# Patient Record
Sex: Male | Born: 1985
Health system: Southern US, Community
[De-identification: ages and names within clinical notes are randomized; demographics above are authoritative.]

## PROBLEM LIST (undated history)

## (undated) ENCOUNTER — Emergency Department (HOSPITAL_COMMUNITY): Admission: EM | Payer: No Typology Code available for payment source | Source: Home / Self Care

## (undated) DIAGNOSIS — Z8719 Personal history of other diseases of the digestive system: Secondary | ICD-10-CM

## (undated) DIAGNOSIS — E119 Type 2 diabetes mellitus without complications: Secondary | ICD-10-CM

## (undated) DIAGNOSIS — R868 Other abnormal findings in specimens from male genital organs: Secondary | ICD-10-CM

## (undated) DIAGNOSIS — K921 Melena: Secondary | ICD-10-CM

## (undated) DIAGNOSIS — K219 Gastro-esophageal reflux disease without esophagitis: Secondary | ICD-10-CM

## (undated) HISTORY — DX: Melena: K92.1

## (undated) HISTORY — DX: Personal history of other diseases of the digestive system: Z87.19

## (undated) HISTORY — DX: Other abnormal findings in specimens from male genital organs: R86.8

---
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
## 2006-08-28 ENCOUNTER — Emergency Department (HOSPITAL_COMMUNITY): Admission: EM | Admit: 2006-08-28 | Discharge: 2006-08-28 | Payer: Self-pay | Admitting: Emergency Medicine

## 2007-07-23 ENCOUNTER — Emergency Department (HOSPITAL_COMMUNITY): Admission: EM | Admit: 2007-07-23 | Discharge: 2007-07-23 | Payer: Self-pay | Admitting: Emergency Medicine

## 2007-08-13 ENCOUNTER — Emergency Department (HOSPITAL_COMMUNITY): Admission: EM | Admit: 2007-08-13 | Discharge: 2007-08-13 | Payer: Self-pay | Admitting: Emergency Medicine

## 2007-09-21 ENCOUNTER — Emergency Department (HOSPITAL_COMMUNITY): Admission: EM | Admit: 2007-09-21 | Discharge: 2007-09-21 | Payer: Self-pay | Admitting: Emergency Medicine

## 2007-10-01 ENCOUNTER — Emergency Department (HOSPITAL_COMMUNITY): Admission: EM | Admit: 2007-10-01 | Discharge: 2007-10-02 | Payer: Self-pay | Admitting: Emergency Medicine

## 2010-03-16 ENCOUNTER — Emergency Department (HOSPITAL_COMMUNITY): Admission: EM | Admit: 2010-03-16 | Discharge: 2010-03-16 | Payer: Self-pay | Admitting: Family Medicine

## 2010-08-04 ENCOUNTER — Emergency Department (HOSPITAL_COMMUNITY): Admission: EM | Admit: 2010-08-04 | Discharge: 2010-08-04 | Payer: Self-pay | Admitting: Emergency Medicine

## 2010-08-06 HISTORY — PX: RECTAL SURGERY: SHX760

## 2010-08-10 ENCOUNTER — Ambulatory Visit (HOSPITAL_COMMUNITY): Admission: RE | Admit: 2010-08-10 | Discharge: 2010-08-10 | Payer: Self-pay | Admitting: General Surgery

## 2010-11-06 HISTORY — PX: ANAL FISTULECTOMY: SHX1139

## 2011-01-19 LAB — SURGICAL PCR SCREEN
MRSA, PCR: NEGATIVE
Staphylococcus aureus: NEGATIVE

## 2011-04-04 ENCOUNTER — Inpatient Hospital Stay (INDEPENDENT_AMBULATORY_CARE_PROVIDER_SITE_OTHER)
Admission: RE | Admit: 2011-04-04 | Discharge: 2011-04-04 | Disposition: A | Discharge status: Home / Self Care | Payer: Self-pay | Source: Ambulatory Visit | Attending: Family Medicine | Admitting: Family Medicine

## 2011-04-04 DIAGNOSIS — R609 Edema, unspecified: Secondary | ICD-10-CM

## 2011-04-04 DIAGNOSIS — L02419 Cutaneous abscess of limb, unspecified: Secondary | ICD-10-CM

## 2011-04-07 LAB — CULTURE, ROUTINE-ABSCESS: Gram Stain: NONE SEEN

## 2011-06-09 ENCOUNTER — Inpatient Hospital Stay (INDEPENDENT_AMBULATORY_CARE_PROVIDER_SITE_OTHER)
Admission: RE | Admit: 2011-06-09 | Discharge: 2011-06-09 | Disposition: A | Discharge status: Home / Self Care | Payer: Self-pay | Source: Ambulatory Visit | Attending: Emergency Medicine | Admitting: Emergency Medicine

## 2011-06-14 ENCOUNTER — Inpatient Hospital Stay (INDEPENDENT_AMBULATORY_CARE_PROVIDER_SITE_OTHER)
Admission: RE | Admit: 2011-06-14 | Discharge: 2011-06-14 | Disposition: A | Discharge status: Home / Self Care | Payer: Self-pay | Source: Ambulatory Visit | Attending: Family Medicine | Admitting: Family Medicine

## 2011-06-14 DIAGNOSIS — K6289 Other specified diseases of anus and rectum: Secondary | ICD-10-CM

## 2011-08-15 ENCOUNTER — Encounter (INDEPENDENT_AMBULATORY_CARE_PROVIDER_SITE_OTHER): Payer: Self-pay | Admitting: Surgery

## 2011-08-17 ENCOUNTER — Encounter (INDEPENDENT_AMBULATORY_CARE_PROVIDER_SITE_OTHER): Payer: Self-pay | Admitting: Surgery

## 2011-08-18 ENCOUNTER — Ambulatory Visit (INDEPENDENT_AMBULATORY_CARE_PROVIDER_SITE_OTHER): Payer: PRIVATE HEALTH INSURANCE | Admitting: Surgery

## 2011-08-18 ENCOUNTER — Encounter (INDEPENDENT_AMBULATORY_CARE_PROVIDER_SITE_OTHER): Payer: Self-pay | Admitting: Surgery

## 2011-08-18 VITALS — BP 142/98 | HR 72 | Temp 97.8°F | Resp 20 | Ht 77.0 in | Wt 337.5 lb

## 2011-08-18 DIAGNOSIS — K603 Anal fistula: Secondary | ICD-10-CM

## 2011-08-18 NOTE — Progress Notes (Signed)
Subjective:     Patient ID: Jonathan Taylor, male   DOB: 02/14/1986, 25 y.o.   MRN: [id number]  HPI This patient is well-known to our practice. He has been operated on the past for perianal fistula. He now said her recurrence. He has significant perianal discomfort with purulent drainage. He has no continence issues  Review of Systems     Objective:   Physical Exam On rectal exam, he has 2 fistula tract openings. There is some purulence but no fluctuance. He has tight sphincter tone    Assessment:     Recurrent perianal fistula and possible fissure    Plan:     Repeat exam under anesthesia and fistulotomy with possible sphincterotomy is recommended. I discussed this with him in detail. I discussed the risk of surgery which includes but not limited to bleeding, infection, recurrence, injury, incontinence. He understands and wishes to proceed. There is a moderate chance this will still not resolve his issues and he may need referral to a gastroenterologist to rule out Crohn's disease

## 2011-08-23 ENCOUNTER — Other Ambulatory Visit (INDEPENDENT_AMBULATORY_CARE_PROVIDER_SITE_OTHER): Payer: Self-pay | Admitting: Surgery

## 2011-08-23 ENCOUNTER — Ambulatory Visit (HOSPITAL_COMMUNITY)
Admission: RE | Admit: 2011-08-23 | Discharge: 2011-08-23 | Disposition: A | Discharge status: Home / Self Care | Payer: Self-pay | Source: Ambulatory Visit | Attending: Surgery | Admitting: Surgery

## 2011-08-23 ENCOUNTER — Encounter (HOSPITAL_COMMUNITY): Payer: Self-pay

## 2011-08-23 DIAGNOSIS — Z01818 Encounter for other preprocedural examination: Secondary | ICD-10-CM | POA: Insufficient documentation

## 2011-08-23 DIAGNOSIS — K603 Anal fistula, unspecified: Secondary | ICD-10-CM | POA: Insufficient documentation

## 2011-08-23 DIAGNOSIS — Z01810 Encounter for preprocedural cardiovascular examination: Secondary | ICD-10-CM | POA: Insufficient documentation

## 2011-08-23 DIAGNOSIS — Z01812 Encounter for preprocedural laboratory examination: Secondary | ICD-10-CM | POA: Insufficient documentation

## 2011-08-23 LAB — BASIC METABOLIC PANEL
BUN: 12 mg/dL (ref 6–23)
CO2: 27 mEq/L (ref 19–32)
Calcium: 10 mg/dL (ref 8.4–10.5)
Chloride: 105 mEq/L (ref 96–112)
Creatinine, Ser: 0.96 mg/dL (ref 0.50–1.35)
GFR calc Af Amer: >90 mL/min (ref >90)
GFR calc non Af Amer: >90 mL/min (ref >90)
Glucose, Bld: 102 mg/dL — ABNORMAL HIGH (ref 70–99)
Potassium: 4.2 mEq/L (ref 3.5–5.1)
Sodium: 138 mEq/L (ref 135–145)

## 2011-08-23 LAB — CBC
HCT: 42.5 % (ref 39.0–52.0)
Hemoglobin: 13.8 g/dL (ref 13.0–17.0)
MCH: 26.2 pg (ref 26.0–34.0)
MCHC: 32.5 g/dL (ref 30.0–36.0)
MCV: 80.8 fL (ref 78.0–100.0)
Platelets: 242 10*3/uL (ref 150–400)
RBC: 5.26 MIL/uL (ref 4.22–5.81)
RDW: 13.2 % (ref 11.5–15.5)
WBC: 6.1 10*3/uL (ref 4.0–10.5)

## 2011-08-23 LAB — SURGICAL PCR SCREEN
MRSA, PCR: NEGATIVE
Staphylococcus aureus: NEGATIVE

## 2011-08-25 ENCOUNTER — Ambulatory Visit (HOSPITAL_COMMUNITY)
Admission: RE | Admit: 2011-08-25 | Discharge: 2011-08-25 | Disposition: A | Discharge status: Home / Self Care | Payer: Self-pay | Source: Ambulatory Visit | Attending: Surgery | Admitting: Surgery

## 2011-08-25 DIAGNOSIS — K603 Anal fistula, unspecified: Secondary | ICD-10-CM | POA: Insufficient documentation

## 2011-08-25 DIAGNOSIS — Z01812 Encounter for preprocedural laboratory examination: Secondary | ICD-10-CM | POA: Insufficient documentation

## 2011-08-25 DIAGNOSIS — Z01810 Encounter for preprocedural cardiovascular examination: Secondary | ICD-10-CM | POA: Insufficient documentation

## 2011-08-25 DIAGNOSIS — Z01818 Encounter for other preprocedural examination: Secondary | ICD-10-CM | POA: Insufficient documentation

## 2011-08-31 ENCOUNTER — Telehealth (INDEPENDENT_AMBULATORY_CARE_PROVIDER_SITE_OTHER): Payer: Self-pay | Admitting: General Surgery

## 2011-08-31 NOTE — Telephone Encounter (Signed)
PT WIFE SABRINA CALLED TO ASK IF BROWN DRAINAGE WAS OK AFTER BOWEL MOVEMENT TODAY. SHE WAS NOT SURE WHAT TO EXPECT. NO BLEEDING OR FEVER REPORTED. I REVIEWED THIS WITH DR. BLACKMAN AND HE SAID PT COULD EXPECT DRAINAGE FOR AT LEAST A WEEK FROM SURGERY DUE TO INFECTION, COULD BE VARIOUS COLORS AND CONSISTENCY. I SPOKE WITH PT RE THIS ISSUE. HE WILL CONTINUE MIRALAX AND FLUIDS. WILL CALL IF DRAINAGE IS BLOODY OR FEVER DEVELOPS.

## 2011-08-31 NOTE — Op Note (Signed)
  NAMEJASON, Jonathan Taylor               ACCOUNT NO.:  [id number]  MEDICAL RECORD NO.:  [id number]  LOCATION:  [facility]                         FACILITY:  [facility]  PHYSICIAN:  Abigail Miyamoto, M.D. DATE OF BIRTH:  [birth year]  DATE OF PROCEDURE:  08/25/2011 DATE OF DISCHARGE:                              OPERATIVE REPORT   PREOPERATIVE DIAGNOSIS:  Anal fistula.  POSTOPERATIVE DIAGNOSIS:  Anal fistula.  PROCEDURES:  Examination under anesthesia and fistulotomy x2.  SURGEON:  Abigail Miyamoto, MD  ANESTHESIA:  General and injectable Exparel.  ESTIMATED BLOOD LOSS:  Minimal.  INDICATION:  This is a 25 year old gentleman who has had previous perianal abscesses as well as fistula in the past.  He has had a fistulotomy before as well.  He now presents with 2 separate draining fistulae/sinus tracts.  FINDINGS:  The patient was indeed found to have 2 draining sinus tracts, both were lateral on the left side.  With the patient in lithotomy position, his intraanal examination was normal.  There was no evidence of intraanal inflammatory bowel disease.  A simple fistulotomy was done with both fistulae.  PROCEDURE IN DETAIL:  The patient was brought to the operating room, identified as Valda Lamb.  He was placed on the operating room table and general anesthesia was induced.  He was then placed in lithotomy position.  His perianal area was then prepped and draped in the usual sterile fashion.  The 2 fistula tracts started at the opening of the skin on the left perianal area.  The medial one was at the anteroposterior midline.  I inserted a fistula probe into each fistula and they both seem to come into the anal canal at the same area.  They appeared superficial to the sphincter muscles.  I opened up both fistula tracts over the top of the anal probes with the electrocautery.  I then cauterized the granulation tissue and excised it from both fistula tracts.  With the retractor in the anal  canal, we took a circumferential inspection prior to this and the anal canal itself appeared normal with no enlarged hemorrhoids and no evidence of inflammatory bowel disease or malignancy.  Once out of the fistula tracts, I was able to achieve hemostasis with cautery.  I then injected the area circumferentially with Exparel.  I placed a Gelfoam into the anal canal.  Hemostasis appeared to be achieved.  At this point, gauze and tape were then applied.  The patient tolerated the procedure well.  All counts were correct at the end of procedure.  The patient was then extubated in the operating room and taken in stable to recovery room.     Abigail Miyamoto, M.D.     DB/MEDQ  D:  08/25/2011  T:  08/25/2011  Job:  [id number]  Electronically Signed by Abigail Miyamoto M.D. on 08/31/2011 09:08:01 AM

## 2011-09-04 ENCOUNTER — Encounter (INDEPENDENT_AMBULATORY_CARE_PROVIDER_SITE_OTHER): Payer: Self-pay | Admitting: Surgery

## 2011-09-06 ENCOUNTER — Encounter (INDEPENDENT_AMBULATORY_CARE_PROVIDER_SITE_OTHER): Payer: Self-pay | Admitting: Surgery

## 2011-09-12 ENCOUNTER — Ambulatory Visit (INDEPENDENT_AMBULATORY_CARE_PROVIDER_SITE_OTHER): Payer: PRIVATE HEALTH INSURANCE | Admitting: Surgery

## 2011-09-12 ENCOUNTER — Encounter (INDEPENDENT_AMBULATORY_CARE_PROVIDER_SITE_OTHER): Payer: Self-pay | Admitting: Surgery

## 2011-09-12 VITALS — BP 148/106 | HR 64 | Temp 97.9°F | Resp 18 | Ht 78.0 in | Wt 334.2 lb

## 2011-09-12 DIAGNOSIS — K603 Anal fistula: Secondary | ICD-10-CM | POA: Insufficient documentation

## 2011-09-12 DIAGNOSIS — Z09 Encounter for follow-up examination after completed treatment for conditions other than malignant neoplasm: Secondary | ICD-10-CM

## 2011-09-12 NOTE — Progress Notes (Signed)
Subjective:     Patient ID: Jonathan Taylor, male   DOB: Aug 18, 1986, 25 y.o.   MRN: [id number]  HPI He is here for his first postoperative visit status post fistulotomy. He is still using a stool softener. He has mild perianal discomfort now. He has only mild drainage. He has no sphincter issues  Review of Systems     Objective:   Physical Exam    On exam, the fistulotomy site is healing very well without evidence of infection Assessment:     Patient status post anal fistulotomy    Plan:     He will continue his current wound care. I reviewed his hydrocodone. I gave him a note to return to work on November 26. I will see him back in approximately 3 weeks

## 2011-10-03 ENCOUNTER — Ambulatory Visit (INDEPENDENT_AMBULATORY_CARE_PROVIDER_SITE_OTHER): Payer: PRIVATE HEALTH INSURANCE | Admitting: Surgery

## 2011-10-03 ENCOUNTER — Encounter (INDEPENDENT_AMBULATORY_CARE_PROVIDER_SITE_OTHER): Payer: PRIVATE HEALTH INSURANCE | Admitting: Surgery

## 2011-10-03 ENCOUNTER — Encounter (INDEPENDENT_AMBULATORY_CARE_PROVIDER_SITE_OTHER): Payer: Self-pay | Admitting: Surgery

## 2011-10-03 VITALS — BP 140/80 | HR 75 | Temp 98.6°F | Resp 14 | Ht 72.0 in | Wt 336.8 lb

## 2011-10-03 DIAGNOSIS — Z09 Encounter for follow-up examination after completed treatment for conditions other than malignant neoplasm: Secondary | ICD-10-CM

## 2011-10-03 NOTE — Progress Notes (Signed)
Subjective:     Patient ID: Jonathan Taylor, male   DOB: June 20, 1986, 25 y.o.   MRN: [id number]  HPI  He is here today for another visit. He reports he is doing very well. He has minimal perianal discomfort and was no drainage. Review of Systems     Objective:   Physical Exam    On exam, he is almost completely healed. Assessment:     Patient status post fistulotomy    Plan:     As he is almost healed, he'll come back and see me as needed. I encouraged him to come back he develop any discomfort

## 2012-05-28 ENCOUNTER — Emergency Department (INDEPENDENT_AMBULATORY_CARE_PROVIDER_SITE_OTHER)
Admission: EM | Admit: 2012-05-28 | Discharge: 2012-05-28 | Disposition: A | Discharge status: Home / Self Care | Payer: Self-pay | Source: Home / Self Care | Attending: Emergency Medicine | Admitting: Emergency Medicine

## 2012-05-28 ENCOUNTER — Encounter (HOSPITAL_COMMUNITY): Payer: Self-pay

## 2012-05-28 DIAGNOSIS — S30812A Abrasion of penis, initial encounter: Secondary | ICD-10-CM

## 2012-05-28 DIAGNOSIS — IMO0002 Reserved for concepts with insufficient information to code with codable children: Secondary | ICD-10-CM

## 2012-05-28 NOTE — ED Provider Notes (Signed)
History     CSN: [id number]  Arrival date & time 05/28/12  1511   First MD Initiated Contact with Patient 05/28/12 1656      Chief Complaint  Patient presents with  . Abrasion    (Consider location/radiation/quality/duration/timing/severity/associated sxs/prior treatment) HPI Comments: Pt had sex yesterday with a condom, noticed this morning small lesion just beneath glans of penis.  Wants to be checked.  Denies hx STDs, denies any other ex. Pt denies any pain.  Patient is a 26 y.o. male presenting with groin pain.  Groin Pain This is a new problem. The current episode started 6 to 12 hours ago. The problem occurs constantly. The problem has not changed since onset.Nothing aggravates the symptoms. Nothing relieves the symptoms. He has tried nothing for the symptoms.    Past Medical History  Diagnosis Date  . Rectal pain   . Blood in stool     Past Surgical History  Procedure Date  . Rectal surgery 08/2010    Dr Freida Busman  . Rectum surgery  August 20 2010  . Anal fistulectomy 2012    with sphhincterotomy    History reviewed. No pertinent family history.  History  Substance Use Topics  . Smoking status: Current Everyday Smoker -- 0.1 packs/day    Types: Cigarettes  . Smokeless tobacco: Never Used  . Alcohol Use: No      Review of Systems  Constitutional: Negative for fever and chills.  Genitourinary: Negative for discharge, penile swelling, penile pain and testicular pain.  Skin: Positive for wound.    Allergies  Review of patient's allergies indicates no known allergies.  Home Medications  No current outpatient prescriptions on file.  BP 141/96  Pulse 68  Temp 98.7 F (37.1 C) (Oral)  Resp 16  SpO2 100%  Physical Exam  Constitutional: He appears well-developed and well-nourished. No distress.  Pulmonary/Chest: Effort normal.  Genitourinary: No penile erythema or penile tenderness. No discharge found.       Tiny 1-91mm abrasion dorsal penis just below  glans, otherwise normal exam    ED Course  Procedures (including critical care time)  Labs Reviewed - No data to display No results found.   1. Abrasion of penis       MDM          Cathlyn Parsons, NP 05/28/12 2113
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
## 2012-05-29 NOTE — ED Provider Notes (Signed)
Medical screening examination/treatment/procedure(s) were performed by non-physician practitioner and as supervising physician I was immediately available for consultation/collaboration.  Leslee Home, M.D.   Reuben Likes, MD 05/29/12 [phone]

## 2012-06-23 ENCOUNTER — Encounter (HOSPITAL_COMMUNITY): Payer: Self-pay | Admitting: *Deleted

## 2012-06-23 ENCOUNTER — Emergency Department (HOSPITAL_COMMUNITY)
Admission: EM | Admit: 2012-06-23 | Discharge: 2012-06-24 | Disposition: A | Discharge status: Home / Self Care | Payer: No Typology Code available for payment source | Attending: Emergency Medicine | Admitting: Emergency Medicine

## 2012-06-23 DIAGNOSIS — Z043 Encounter for examination and observation following other accident: Secondary | ICD-10-CM | POA: Insufficient documentation

## 2012-06-23 DIAGNOSIS — F17200 Nicotine dependence, unspecified, uncomplicated: Secondary | ICD-10-CM | POA: Insufficient documentation

## 2012-06-23 DIAGNOSIS — T148XXA Other injury of unspecified body region, initial encounter: Secondary | ICD-10-CM
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
## 2012-06-24 MED ORDER — IBUPROFEN 800 MG PO TABS: 800.0000 mg | ORAL_TABLET | Freq: Once | ORAL | Status: DC

## 2012-06-24 MED ORDER — DIAZEPAM 5 MG PO TABS: 5.0000 mg | ORAL_TABLET | Freq: Two times a day (BID) | ORAL | Status: AC
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
## 2012-06-25 NOTE — ED Provider Notes (Signed)
Medical screening examination/treatment/procedure(s) were performed by non-physician practitioner and as supervising physician I was immediately available for consultation/collaboration.    Nelia Shi, MD 06/25/12 2250

## 2012-06-26 ENCOUNTER — Ambulatory Visit
Admission: RE | Admit: 2012-06-26 | Discharge: 2012-06-26 | Disposition: A | Discharge status: Home / Self Care | Payer: Self-pay | Source: Ambulatory Visit | Attending: Physician Assistant | Admitting: Physician Assistant

## 2012-06-26 ENCOUNTER — Other Ambulatory Visit: Payer: Self-pay | Admitting: Physician Assistant

## 2012-06-26 DIAGNOSIS — M542 Cervicalgia: Secondary | ICD-10-CM

## 2012-09-28 ENCOUNTER — Emergency Department (HOSPITAL_COMMUNITY): Payer: Self-pay

## 2012-09-28 ENCOUNTER — Encounter (HOSPITAL_COMMUNITY): Payer: Self-pay

## 2012-09-28 ENCOUNTER — Emergency Department (HOSPITAL_COMMUNITY)
Admission: EM | Admit: 2012-09-28 | Discharge: 2012-09-28 | Disposition: A | Discharge status: Home / Self Care | Payer: Self-pay | Attending: Emergency Medicine | Admitting: Emergency Medicine

## 2012-09-28 DIAGNOSIS — F17200 Nicotine dependence, unspecified, uncomplicated: Secondary | ICD-10-CM | POA: Insufficient documentation

## 2012-09-28 DIAGNOSIS — Y9289 Other specified places as the place of occurrence of the external cause: Secondary | ICD-10-CM | POA: Insufficient documentation

## 2012-09-28 DIAGNOSIS — M7989 Other specified soft tissue disorders: Secondary | ICD-10-CM | POA: Insufficient documentation

## 2012-09-28 DIAGNOSIS — S93409A Sprain of unspecified ligament of unspecified ankle, initial encounter: Secondary | ICD-10-CM | POA: Insufficient documentation

## 2012-09-28 DIAGNOSIS — Z8719 Personal history of other diseases of the digestive system: Secondary | ICD-10-CM | POA: Insufficient documentation

## 2012-09-28 DIAGNOSIS — Y9389 Activity, other specified: Secondary | ICD-10-CM | POA: Insufficient documentation

## 2012-09-28 DIAGNOSIS — X500XXA Overexertion from strenuous movement or load, initial encounter: Secondary | ICD-10-CM | POA: Insufficient documentation

## 2012-09-28 MED ORDER — TRAMADOL HCL 50 MG PO TABS: 50.0000 mg | ORAL_TABLET | Freq: Four times a day (QID) | ORAL | Status: DC | PRN

## 2012-09-28 MED ORDER — TRAMADOL HCL 50 MG PO TABS
50.0000 mg | ORAL_TABLET | Freq: Once | ORAL | Status: AC
  Administered 2012-09-28: 50 mg via ORAL
  Filled 2012-09-28: qty 1

## 2012-09-28 MED ORDER — IBUPROFEN 600 MG PO TABS: 600.0000 mg | ORAL_TABLET | Freq: Four times a day (QID) | ORAL | Status: DC | PRN

## 2012-09-28 MED ORDER — IBUPROFEN 400 MG PO TABS
600.0000 mg | ORAL_TABLET | Freq: Once | ORAL | Status: AC
  Administered 2012-09-28: 600 mg via ORAL
  Filled 2012-09-28: qty 1

## 2012-09-28 NOTE — ED Notes (Signed)
Pt states he slipped off a step last night and twisted his ankle.  Pt states he heard and felt "ripping in his ankle."  Pt applied ice with little relief.

## 2012-09-28 NOTE — Progress Notes (Signed)
Orthopedic Tech Progress Note Patient Details:  Jonathan Taylor [birth year] [id number] Crutches fitted to patient height and comfort. Patient demonstrated proper crutch use. Nurse applied ace wrap to patient's ankle.  Ortho Devices Type of Ortho Device: Crutches Ortho Device/Splint Interventions: Application   Asia R Thompson 09/28/2012, 10:15 AM

## 2012-09-28 NOTE — ED Provider Notes (Signed)
History     CSN: [id number]  Arrival date & time 09/28/12  4098   First MD Initiated Contact with Patient 09/28/12 [phone]      Chief Complaint  Patient presents with  . Ankle Pain    (Consider location/radiation/quality/duration/timing/severity/associated sxs/prior treatment) HPI Pt states he twisted his right ankle coming down stairs last night. No other trauma. +swelling and lateral ankle pain. No head or neck trauma. No weakness or sensory loss.  Past Medical History  Diagnosis Date  . Rectal pain   . Blood in stool     Past Surgical History  Procedure Date  . Rectal surgery 08/2010    Dr Freida Busman  . Rectum surgery  August 20 2010  . Anal fistulectomy 2012    with sphhincterotomy    No family history on file.  History  Substance Use Topics  . Smoking status: Current Every Day Smoker -- 0.1 packs/day    Types: Cigarettes  . Smokeless tobacco: Never Used  . Alcohol Use: No      Review of Systems  HENT: Negative for neck pain.   Musculoskeletal: Positive for joint swelling.  Skin: Negative for rash and wound.  Neurological: Negative for syncope, weakness and numbness.    Allergies  Tomato and Mushroom extract complex  Home Medications   Current Outpatient Rx  Name  Route  Sig  Dispense  Refill  . IBUPROFEN 600 MG PO TABS   Oral   Take 1 tablet (600 mg total) by mouth every 6 (six) hours as needed for pain.   30 tablet   0   . TRAMADOL HCL 50 MG PO TABS   Oral   Take 1 tablet (50 mg total) by mouth every 6 (six) hours as needed for pain.   15 tablet   0     BP 149/79  Pulse 82  Temp 98.4 F (36.9 C) (Oral)  Resp 13  SpO2 98%  Physical Exam  Nursing note and vitals reviewed. Constitutional: He is oriented to person, place, and time. He appears well-developed and well-nourished. No distress.  HENT:  Head: Normocephalic and atraumatic.  Neck: Normal range of motion. Neck supple.  Pulmonary/Chest: Effort normal.  Abdominal: Soft.    Musculoskeletal: He exhibits tenderness (TTP over Lateral mal of R ankle. Minimal swelling. Neurovasc intact. ).  Neurological: He is alert and oriented to person, place, and time.  Skin: Skin is warm and dry. No rash noted. No erythema. No pallor.    ED Course  Procedures (including critical care time)  Labs Reviewed - No data to display Dg Ankle Complete Right  09/28/2012  *RADIOLOGY REPORT*  Clinical Data: Pain and swelling  RIGHT ANKLE - COMPLETE 3+ VIEW  Comparison: Plain film 10/02/2007  Findings: Ankle mortise intact.  Talar dome is normal.  No malleolar fracture.  No joint effusion.  Calcaneus is normal.  IMPRESSION: No ankle fracture.   Original Report Authenticated By: Genevive Bi, M.D.      1. Ankle sprain       MDM          Loren Racer, MD 09/28/12 [phone]

## 2013-08-13 ENCOUNTER — Encounter (HOSPITAL_COMMUNITY): Payer: Self-pay | Admitting: Emergency Medicine

## 2013-08-13 ENCOUNTER — Emergency Department (INDEPENDENT_AMBULATORY_CARE_PROVIDER_SITE_OTHER)
Admission: EM | Admit: 2013-08-13 | Discharge: 2013-08-13 | Disposition: A | Discharge status: Home / Self Care | Payer: Self-pay | Source: Home / Self Care | Attending: Family Medicine | Admitting: Family Medicine

## 2013-08-13 DIAGNOSIS — T148XXA Other injury of unspecified body region, initial encounter: Secondary | ICD-10-CM

## 2013-08-13 NOTE — ED Provider Notes (Signed)
CSN: [id number]     Arrival date & time 08/13/13  1901 History   First MD Initiated Contact with Patient 08/13/13 1933     No chief complaint on file.  (Consider location/radiation/quality/duration/timing/severity/associated sxs/prior Treatment) Patient is a 27 y.o. male presenting with hand pain. The history is provided by the patient.  Hand Pain This is a new problem. The current episode started more than 2 days ago. The problem has not changed since onset.Associated symptoms comments: Numbness to palmar aspect of all fingers of left hand , palmar tenderness.works as Scientist, water quality..    Past Medical History  Diagnosis Date  . Rectal pain   . Blood in stool    Past Surgical History  Procedure Laterality Date  . Rectal surgery  08/2010    Dr Freida Busman  . Rectum surgery   August 20 2010  . Anal fistulectomy  2012    with sphhincterotomy   No family history on file. History  Substance Use Topics  . Smoking status: Current Every Day Smoker -- 0.15 packs/day    Types: Cigarettes  . Smokeless tobacco: Never Used  . Alcohol Use: No    Review of Systems  Constitutional: Negative.   Musculoskeletal: Negative for gait problem, joint swelling and neck pain.  Neurological: Positive for numbness.    Allergies  Tomato and Mushroom extract complex  Home Medications   Current Outpatient Rx  Name  Route  Sig  Dispense  Refill  . ibuprofen (ADVIL,MOTRIN) 600 MG tablet   Oral   Take 1 tablet (600 mg total) by mouth every 6 (six) hours as needed for pain.   30 tablet   0   . traMADol (ULTRAM) 50 MG tablet   Oral   Take 1 tablet (50 mg total) by mouth every 6 (six) hours as needed for pain.   15 tablet   0    BP 135/86  Pulse 80  Temp(Src) 98.5 F (36.9 C) (Oral)  Resp 18  SpO2 97% Physical Exam  Nursing note and vitals reviewed. Constitutional: He is oriented to person, place, and time. He appears well-developed and well-nourished.  Musculoskeletal: He exhibits tenderness.         Left hand: He exhibits tenderness. Decreased sensation noted. Normal strength noted. He exhibits no finger abduction, no thumb/finger opposition and no wrist extension trouble.       Hands: Neurological: He is alert and oriented to person, place, and time.  Skin: Skin is warm and dry.    ED Course  Procedures (including critical care time) Labs Review Labs Reviewed - No data to display Imaging Review No results found.  MDM      Linna Hoff, MD 08/13/13 2022

## 2013-08-13 NOTE — ED Notes (Signed)
Hand numbness; plays a lot of video games

## 2014-07-15 ENCOUNTER — Other Ambulatory Visit (INDEPENDENT_AMBULATORY_CARE_PROVIDER_SITE_OTHER): Payer: BC Managed Care – PPO

## 2014-07-15 ENCOUNTER — Encounter: Payer: Self-pay | Admitting: Internal Medicine

## 2014-07-15 ENCOUNTER — Ambulatory Visit (INDEPENDENT_AMBULATORY_CARE_PROVIDER_SITE_OTHER): Payer: BC Managed Care – PPO | Admitting: Internal Medicine

## 2014-07-15 ENCOUNTER — Telehealth: Payer: Self-pay

## 2014-07-15 VITALS — BP 132/90 | HR 64 | Ht 74.5 in | Wt 347.5 lb

## 2014-07-15 DIAGNOSIS — K603 Anal fistula: Secondary | ICD-10-CM

## 2014-07-15 DIAGNOSIS — K6289 Other specified diseases of anus and rectum: Secondary | ICD-10-CM

## 2014-07-15 LAB — HIGH SENSITIVITY CRP: CRP, High Sensitivity: 17.16 mg/L — ABNORMAL HIGH (ref 0.000–5.000)

## 2014-07-15 LAB — CBC WITH DIFFERENTIAL/PLATELET
Basophils Absolute: 0 10*3/uL (ref 0.0–0.1)
Basophils Relative: 0.4 % (ref 0.0–3.0)
Eosinophils Absolute: 0.1 10*3/uL (ref 0.0–0.7)
Eosinophils Relative: 0.9 % (ref 0.0–5.0)
HCT: 42 % (ref 39.0–52.0)
Hemoglobin: 13.8 g/dL (ref 13.0–17.0)
Lymphocytes Relative: 34.4 % (ref 12.0–46.0)
Lymphs Abs: 2.1 10*3/uL (ref 0.7–4.0)
MCHC: 32.8 g/dL (ref 30.0–36.0)
MCV: 79.6 fl (ref 78.0–100.0)
Monocytes Absolute: 0.5 10*3/uL (ref 0.1–1.0)
Monocytes Relative: 8.3 % (ref 3.0–12.0)
Neutro Abs: 3.4 10*3/uL (ref 1.4–7.7)
Neutrophils Relative %: 56 % (ref 43.0–77.0)
Platelets: 232 10*3/uL (ref 150.0–400.0)
RBC: 5.28 Mil/uL (ref 4.22–5.81)
RDW: 13.7 % (ref 11.5–15.5)
WBC: 6.1 10*3/uL (ref 4.0–10.5)

## 2014-07-15 LAB — COMPREHENSIVE METABOLIC PANEL
ALT: 50 U/L (ref 0–53)
AST: 37 U/L (ref 0–37)
Albumin: 3.7 g/dL (ref 3.5–5.2)
Alkaline Phosphatase: 75 U/L (ref 39–117)
BUN: 13 mg/dL (ref 6–23)
CO2: 26 mEq/L (ref 19–32)
Calcium: 9.4 mg/dL (ref 8.4–10.5)
Chloride: 106 mEq/L (ref 96–112)
Creatinine, Ser: 0.9 mg/dL (ref 0.4–1.5)
GFR: 122.81 mL/min (ref >60.00)
Glucose, Bld: 126 mg/dL — ABNORMAL HIGH (ref 70–99)
Potassium: 4 mEq/L (ref 3.5–5.1)
Sodium: 139 mEq/L (ref 135–145)
Total Bilirubin: 0.4 mg/dL (ref 0.2–1.2)
Total Protein: 7.1 g/dL (ref 6.0–8.3)

## 2014-07-15 LAB — SEDIMENTATION RATE: Sed Rate: 13 mm/hr (ref 0–22)

## 2014-07-15 MED ORDER — MOVIPREP 100 G PO SOLR: 1.0000 | Freq: Once | ORAL | Status: DC

## 2014-07-15 MED ORDER — CIPROFLOXACIN HCL 500 MG PO TABS: 500.0000 mg | ORAL_TABLET | Freq: Two times a day (BID) | ORAL | Status: DC

## 2014-07-15 MED ORDER — METRONIDAZOLE 500 MG PO TABS: 500.0000 mg | ORAL_TABLET | Freq: Three times a day (TID) | ORAL | Status: DC

## 2014-07-15 MED ORDER — HYDROCODONE-ACETAMINOPHEN 5-325 MG PO TABS: 1.0000 | ORAL_TABLET | Freq: Four times a day (QID) | ORAL | Status: DC | PRN

## 2014-07-15 NOTE — Patient Instructions (Addendum)
Your physician has requested that you go to the basement for lab work before leaving today  We have sent the following medications to your pharmacy for you to pick up at your convenience:  Vicodin, Flagyl, Cipro  Take Colace 372m over the counter - 2 tablets once a day   You have been scheduled for a colonoscopy. Please follow written instructions given to you at your visit today.  Please pick up your prep kit at the pharmacy within the next 1-3 days. If you use inhalers (even only as needed), please bring them with you on the day of your procedure. Your physician has requested that you go to www.startemmi.com and enter the access code given to you at your visit today. This web site gives a general overview about your procedure. However, you should still follow specific instructions given to you by our office regarding your preparation for the procedure.

## 2014-07-15 NOTE — Progress Notes (Signed)
Patient ID: Jonathan Taylor, male   DOB: Mar 14, 1986, 28 y.o.   MRN: [id number] HPI: Jonathan Taylor is a 28 year old male with past medical history of perianal fistula and abscess who is seen to evaluate recurrent perianal pain. He is here alone today. He has a history dating back to 2011 and 2012 of perianal fistula requiring EUA and fistulotomy x2, last performed on 08/25/2011 by Dr. Coralie Keens. He reports after the surgery he had total healing and no problems until the last month. He has developed recurrent perianal pain which can be severe. He also has felt a "knot" near his anus. He has had some bleeding with wiping but also fishy smelling mucus discharge. He notices the discharge to separate from bowel movements. Bowel movements are painful to pass and also result in throbbing after defecation. He is having approximately 2 bowel movements a day which can occasionally be hard. No diarrhea. No melena. He denies anterior abdominal pain. Appetite fluctuates. No fever occasional night sweats. No weight loss. No heartburn or trouble swallowing. No hepatobiliary complaints. 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  He reports he feels like the pain is at times severe and he would like to miss work but he "doesn't know what to tell them". He said it's hard to tell them "my ass hurts", because he does not think his boss will understand.  She reports having been told that Crohn's disease is a possibility but he has never been diagnosed. He has never had a GI procedure. No family history of IBD or GI tract malignancy.  Past Medical History  Diagnosis Date  . History of anal fissures   . Blood in stool     Past Surgical History  Procedure Laterality Date  . Rectal surgery  08/2010    Dr Zenia Resides  . Rectum surgery   August 20 2010  . Anal fistulectomy  2012    with sphhincterotomy    Outpatient Prescriptions Prior to Visit  Medication Sig Dispense Refill  . ibuprofen  (ADVIL,MOTRIN) 600 MG tablet Take 1 tablet (600 mg total) by mouth every 6 (six) hours as needed for pain.  30 tablet  0  . traMADol (ULTRAM) 50 MG tablet Take 1 tablet (50 mg total) by mouth every 6 (six) hours as needed for pain.  15 tablet  0   No facility-administered medications prior to visit.    Allergies  Allergen Reactions  . Tomato Anaphylaxis  . Mushroom Extract Complex Swelling    Lips    Family History  Problem Relation Age of Onset  . Colon cancer Neg Hx   . Colon polyps Neg Hx   . Diabetes Mother   . Throat cancer Maternal Aunt   . Kidney disease Neg Hx   . Hypertension Mother     History  Substance Use Topics  . Smoking status: Current Every Day Smoker -- 0.50 packs/day    Types: Cigarettes  . Smokeless tobacco: Never Used     Comment: Pt given handout  . Alcohol Use: Yes     Comment: Occassionally    ROS: As per history of present illness, otherwise negative  BP 132/90  Pulse 64  Ht 6' 2.5" (1.892 m)  Wt 347 lb 8 oz (157.625 kg)  BMI 44.03 kg/m2 Constitutional: Well-developed and well-nourished. No distress. HEENT: Normocephalic and atraumatic. Oropharynx is clear and moist. No oropharyngeal exudate. Conjunctivae are normal.  No scleral icterus. Neck: Neck supple. Trachea midline. Cardiovascular: Normal  rate, regular rhythm and intact distal pulses. No M/R/G Pulmonary/chest: Effort normal and breath sounds normal. No wheezing, rales or rhonchi. Abdominal: Soft, nontender, nondistended. Bowel sounds active throughout. There are no masses palpable. No hepatosplenomegaly. Rectal: fistula tract left lateral, 1 cm from anal canal, tender to palpation without fluctuance, unable to express drainage today, internal examination not performed today, no hemorrhoids seen Extremities: no clubbing, cyanosis, or edema Lymphadenopathy: No cervical adenopathy noted. Neurological: Alert and oriented to person place and time. Skin: Skin is warm and dry. No rashes  noted. Psychiatric: Normal mood and affect. Behavior is normal.  RELEVANT LABS AND IMAGING: CBC    Component Value Date/Time   WBC 6.1 08/23/2011 1335   RBC 5.26 08/23/2011 1335   HGB 13.8 08/23/2011 1335   HCT 42.5 08/23/2011 1335   PLT 242 08/23/2011 1335   MCV 80.8 08/23/2011 1335   MCH 26.2 08/23/2011 1335   MCHC 32.5 08/23/2011 1335   RDW 13.2 08/23/2011 1335    CMP     Component Value Date/Time   NA 138 08/23/2011 1335   K 4.2 08/23/2011 1335   CL 105 08/23/2011 1335   CO2 27 08/23/2011 1335   GLUCOSE 102* 08/23/2011 1335   BUN 12 08/23/2011 1335   CREATININE 0.96 08/23/2011 1335   CALCIUM 10.0 08/23/2011 1335   GFRNONAA >90 08/23/2011 1335   GFRAA >90 08/23/2011 1335   Operative report reviewed from October 2012  ASSESSMENT/PLAN: 28 year old male with past medical history of perianal fistula and abscess who is seen to evaluate recurrent perianal pain.  1. recurrent perianal fistula with drainage and pain -- the etiology of his recurrent perianal fistula is in question, with the most common being recurrent perirectal abscess, though certainly Crohn's disease is a real possibility. This is now his third occurrence of perianal fistula. I am going to start him on antibiotics with ciprofloxacin 500 mg twice daily and metronidazole 500 mg 3 times daily for 10 days. Vicodin as directed and as needed for pain. Labs today to include CBC, CMP, ESR and CRP. Am also checking an IBD expanded panel. I recommended colonoscopy for evaluation for colonic Crohn's disease. We discussed the procedure including risks and benefits and he is agreeable to proceed. Would also like him to use Colace 200 mg daily to help soften his stool. I do think he will need time off work through this process and possibly restricted duty. I am going to fax a letter to his supervisor. We did discuss the possibility of the need for fistulotomy/surgical intervention depending on how he responds medically. We also  will make further treatment decisions after colonoscopy, particularly regarding the diagnosis of Crohn's disease and initiation of biologic therapy for the same if necessary.

## 2014-07-16 LAB — IBD EXPANDED PANEL
ACCA: 17 units (ref 0–90)
ALCA: 49 units (ref 0–60)
AMCA: 88 units (ref 0–100)
Atypical pANCA: NEGATIVE
gASCA: 33 units (ref 0–50)

## 2014-07-17 ENCOUNTER — Telehealth: Payer: Self-pay | Admitting: Internal Medicine

## 2014-07-17 MED ORDER — HYDROCODONE-ACETAMINOPHEN 10-325 MG PO TABS: ORAL_TABLET | ORAL | Status: DC

## 2014-07-17 NOTE — Telephone Encounter (Signed)
Pt states that the pain meds he was given are not really helping much. States he has been taking 2 at a time but last night at bedtime he had to take 3 pills. Pt wants to know if there is something else he can have for pain, states he is a "big dude" and it isn't helping. Please advise.

## 2014-07-17 NOTE — Telephone Encounter (Signed)
Left message for pt to call back.  Pt aware and script up front for pt to pickup.

## 2014-07-17 NOTE — Telephone Encounter (Signed)
Can increase to 10 mg tablets, take 1-2 every 6 hours when necessary no driving or heavy machinery while taking this medicine Hopefully antibiotics will help with the pain over the next several days and he will need less narcotic pain medicine

## 2014-07-17 NOTE — Telephone Encounter (Signed)
See previous phone note.  

## 2014-07-20 ENCOUNTER — Encounter: Payer: Self-pay | Admitting: Internal Medicine

## 2014-07-24 ENCOUNTER — Ambulatory Visit (AMBULATORY_SURGERY_CENTER): Payer: BC Managed Care – PPO | Admitting: Internal Medicine

## 2014-07-24 ENCOUNTER — Encounter: Payer: Self-pay | Admitting: Internal Medicine

## 2014-07-24 VITALS — BP 147/78 | HR 58 | Temp 97.2°F | Resp 20 | Ht 74.5 in | Wt 347.0 lb

## 2014-07-24 DIAGNOSIS — D1330 Benign neoplasm of unspecified part of small intestine: Secondary | ICD-10-CM

## 2014-07-24 DIAGNOSIS — K6289 Other specified diseases of anus and rectum: Secondary | ICD-10-CM

## 2014-07-24 DIAGNOSIS — K603 Anal fistula: Secondary | ICD-10-CM

## 2014-07-24 MED ORDER — SODIUM CHLORIDE 0.9 % IV SOLN: 500.0000 mL | INTRAVENOUS | Status: DC

## 2014-07-24 NOTE — Op Note (Signed)
[place]  [address]. [place], [address]   COLONOSCOPY PROCEDURE REPORT  PATIENT: Jonathan Taylor, Jonathan Taylor  MR#: [id number] BIRTHDATE: [birth year] , 28  yrs. old GENDER: Male ENDOSCOPIST: Jerene Bears, MD PROCEDURE DATE:  07/24/2014 PROCEDURE:   Colonoscopy with biopsy First Screening Colonoscopy - Avg.  risk and is 50 yrs.  old or older - No.  Prior Negative Screening - Now for repeat screening. N/A  History of Adenoma - Now for follow-up colonoscopy & has been > or = to 3 yrs.  N/A  Polyps Removed Today? No.  Recommend repeat exam, <10 yrs? No. ASA CLASS:   Class II INDICATIONS:history of recurrent perianal fistula, for evaluation of Crohn's disease. MEDICATIONS: MAC sedation, administered by CRNA and propofol (Diprivan) 260mg  IV  DESCRIPTION OF PROCEDURE:   After the risks benefits and alternatives of the procedure were thoroughly explained, informed consent was obtained.  A digital rectal exam revealed a perianal fistula.   The LB [id number]  endoscope was introduced through the anus and advanced to the terminal ileum which was intubated for a short distance. No adverse events experienced. The quality of the prep was good, using MoviPrep  The instrument was then slowly withdrawn as the colon was fully examined.      COLON FINDINGS: The mucosa appeared in the terminal ileum contained prominent lymphoid tissue without frank erosion or ulceration. Multiple biopsies were performed.   Mild diverticulosis was noted in the sigmoid colon.   The colonic mucosa appeared normal throughout the entire examined colon.  Retroflexed views revealed a small fistula at the dentate line. The time to cecum=3 minutes 22 seconds.  Withdrawal time=10 minutes 34 seconds.  The scope was withdrawn and the procedure completed.  COMPLICATIONS: There were no complications.  ENDOSCOPIC IMPRESSION: 1.   Prominent lymphoid tissue in the terminal ileum without evidence of Crohn's;  multiple biopsies were performed 2.   Very mild diverticulosis was noted in the sigmoid colon 3.   The colonic mucosa appeared normal throughout the entire examined colon 4.   Previously seen fistulous tract and perianal skin is smaller today without drainage, fistula seen on retroflexion near the dentate line without evidence for Crohn's proctitis or Crohn's colitis  RECOMMENDATIONS: 1.  Complete antibiotics 2.  If perianal pain or drainage returns, please contact my office and we will pursue surgical referral to Dr. Ninfa Linden (previously treated prior perianal fistula) 3.  Await biopsy results 4.  Office follow-up with me   eSigned:  Jerene Bears, MD 07/24/2014 8:39 AM   cc: The Patient   PATIENT NAME:  [name], [name] MR#: [id number]

## 2014-07-24 NOTE — Patient Instructions (Signed)
YOU HAD AN ENDOSCOPIC PROCEDURE TODAY AT [facility]: Refer to the procedure report that was given to you for any specific questions about what was found during the examination.  If the procedure report does not answer your questions, please call your gastroenterologist to clarify.  If you requested that your care partner not be given the details of your procedure findings, then the procedure report has been included in a sealed envelope for you to review at your convenience later.  YOU SHOULD EXPECT: Some feelings of bloating in the abdomen. Passage of more gas than usual.  Walking can help get rid of the air that was put into your GI tract during the procedure and reduce the bloating. If you had a lower endoscopy (such as a colonoscopy or flexible sigmoidoscopy) you may notice spotting of blood in your stool or on the toilet paper. If you underwent a bowel prep for your procedure, then you may not have a normal bowel movement for a few days.  DIET: Your first meal following the procedure should be a light meal and then it is ok to progress to your normal diet.  A half-sandwich or bowl of soup is an example of a good first meal.  Heavy or fried foods are harder to digest and may make you feel nauseous or bloated.  Likewise meals heavy in dairy and vegetables can cause extra gas to form and this can also increase the bloating.  Drink plenty of fluids but you should avoid alcoholic beverages for 24 hours. Try to increase the fiber in your diet due to your Diverticulosis.  You are very young to have this already!  ACTIVITY: Your care partner should take you home directly after the procedure.  You should plan to take it easy, moving slowly for the rest of the day.  You can resume normal activity the day after the procedure however you should NOT DRIVE or use heavy machinery for 24 hours (because of the sedation medicines used during the test).    SYMPTOMS TO REPORT IMMEDIATELY: A  gastroenterologist can be reached at any hour.  During normal business hours, 8:30 AM to 5:00 PM Monday through Friday, call [phone].  After hours and on weekends, please call the GI answering service at [phone] who will take a message and have the physician on call contact you.   Following lower endoscopy (colonoscopy or flexible sigmoidoscopy):  Excessive amounts of blood in the stool  Significant tenderness or worsening of abdominal pains  Swelling of the abdomen that is new, acute  Fever of 100F or higher  FOLLOW UP: If any biopsies were taken you will be contacted by phone or by letter within the next 1-3 weeks.  Call your gastroenterologist if you have not heard about the biopsies in 3 weeks.  Our staff will call the home number listed on your records the next business day following your procedure to check on you and address any questions or concerns that you may have at that time regarding the information given to you following your procedure. This is a courtesy call and so if there is no answer at the home number and we have not heard from you through the emergency physician on call, we will assume that you have returned to your regular daily activities without incident.  SIGNATURES/CONFIDENTIALITY: You and/or your care partner have signed paperwork which will be entered into your electronic medical record.  These signatures attest to the fact that that the  information above on your After Visit Summary has been reviewed and is understood.  Full responsibility of the confidentiality of this discharge information lies with you and/or your care-partner.  Please, read the handouts given to you by your recovery room nurse!

## 2014-07-24 NOTE — Progress Notes (Signed)
Procedure ends, to recovery, report given and VSS. 

## 2014-07-24 NOTE — Progress Notes (Signed)
Called to room to assist during endoscopic procedure.  Patient ID and intended procedure confirmed with present staff. Received instructions for my participation in the procedure from the performing physician.  

## 2014-07-27 ENCOUNTER — Telehealth: Payer: Self-pay | Admitting: *Deleted

## 2014-07-27 NOTE — Telephone Encounter (Signed)
Please discard previous telephone call. Patient was unavailable and message was left for the patient.

## 2014-07-27 NOTE — Telephone Encounter (Signed)
  Follow up Call-  Call back number 07/24/2014  Post procedure Call Back phone  # [phone]  Permission to leave phone message Yes     Patient questions:  Do you have a fever, pain , or abdominal swelling? No. Pain Score  0 *  Have you tolerated food without any problems? Yes.    Have you been able to return to your normal activities? Yes.    Do you have any questions about your discharge instructions: Diet   No. Medications  No. Follow up visit  No.  Do you have questions or concerns about your Care? No.  Actions: * If pain score is 4 or above: No action needed, pain <4.

## 2014-07-29 ENCOUNTER — Encounter: Payer: Self-pay | Admitting: Internal Medicine

## 2014-09-30 ENCOUNTER — Encounter: Payer: Self-pay | Admitting: Internal Medicine

## 2014-09-30 ENCOUNTER — Ambulatory Visit (INDEPENDENT_AMBULATORY_CARE_PROVIDER_SITE_OTHER): Payer: BC Managed Care – PPO | Admitting: Internal Medicine

## 2014-09-30 VITALS — BP 142/98 | HR 68 | Ht 75.0 in | Wt 359.4 lb

## 2014-09-30 DIAGNOSIS — K603 Anal fistula: Secondary | ICD-10-CM

## 2014-09-30 MED ORDER — HYDROCODONE-ACETAMINOPHEN 5-325 MG PO TABS: 1.0000 | ORAL_TABLET | Freq: Four times a day (QID) | ORAL | Status: DC | PRN

## 2014-09-30 MED ORDER — METRONIDAZOLE 500 MG PO TABS: 500.0000 mg | ORAL_TABLET | Freq: Three times a day (TID) | ORAL | Status: DC

## 2014-09-30 MED ORDER — CIPROFLOXACIN HCL 500 MG PO TABS: 500.0000 mg | ORAL_TABLET | Freq: Two times a day (BID) | ORAL | Status: DC

## 2014-09-30 NOTE — Progress Notes (Signed)
   Subjective:    Patient ID: Jonathan Taylor, male    DOB: Mar 11, 1986, 28 y.o.   MRN: [id number]  HPI Jonathan Taylor is a 28 yo male with PMH of perianal fistula and abscess who is seen for follow-up. He has a history of perianal fistula dating back to 2011 requiring EUA and fistulotomy 2 last performed on 08/25/2011 by Dr. Ninfa Linden. He had healing at that point but over the last 2 months he has had recurrent symptoms. He was treated with antibiotics which helped his pain and drainage and then came for colonoscopy on 07/24/2014. Colonoscopy to the terminal ileum revealed prominent lymphoid tissue in the terminal ileum but biopsies did not reveal any abnormality or suggestion of IBD. There is very mild diverticulosis in the sigmoid colon and no evidence for Crohn's colitis or proctitis. There was a fistulous tract seen near the dentate line extending to the perianal skin.  Currently he says he still having pain in his perianal skin. This is most noticeable after bowel movement. He feels a throbbing type pain in this area and will have leakage of brownish blood-tinged fluid. This is hard to keep clean. At times it is hard for him to work because of the discomfort. No fevers or chills. No abdominal pain. Stools are regular occurring daily and are not hard.  Review of Systems As per history of present illness, otherwise negative  Current Medications, Allergies, Past Medical History, Past Surgical History, Family History and Social History were reviewed in [employer] record.     Objective:   Physical Exam BP 142/98 mmHg  Pulse 68  Ht 6\' 3"  (1.905 m)  Wt 359 lb 6.4 oz (163.023 kg)  BMI 44.92 kg/m2 Constitutional: Well-developed and well-nourished. No distress. HEENT: Normocephalic and atraumatic.   No scleral icterus. Psychiatric: Normal mood and affect. Behavior is normal.  CBC    Component Value Date/Time   WBC 6.1 07/15/2014 1051   RBC 5.28 07/15/2014 1051   HGB 13.8  07/15/2014 1051   HCT 42.0 07/15/2014 1051   PLT 232.0 07/15/2014 1051   MCV 79.6 07/15/2014 1051   MCH 26.2 08/23/2011 1335   MCHC 32.8 07/15/2014 1051   RDW 13.7 07/15/2014 1051   LYMPHSABS 2.1 07/15/2014 1051   MONOABS 0.5 07/15/2014 1051   EOSABS 0.1 07/15/2014 1051   BASOSABS 0.0 07/15/2014 1051    IBD extended panel -- not suggestive of Crohn's disease     Assessment & Plan:  28 yo male with PMH of perianal fistula and abscess who is seen for follow-up.  1. Perianal fistula with drainage/pain -- the most common cause of perianal fistula is anorectal abscess. There was no evidence of Crohn's disease I colonoscopy, though I cannot be 100% sure this isn't perianal Crohn's disease. I do think management at this point is surgical. Possibly a seton would help.  I will retreat with antibiotics given the drainage he is seeing with ciprofloxacin 500 mg twice daily and metronidazole 500 mg 3 times daily for 7 days. He will be referred back to general surgery. Vicodin to be used sparingly for the pain until definitive treatment

## 2014-09-30 NOTE — Patient Instructions (Addendum)
We have given you a prescription today. We have sent  medications to your pharmacy for you to pick up at your convenience. We have scheduled you an appointment with Dr Johney Maine at [facility] for 10/14/14 1:45 pm arrive by 1:15 pm bring your meds, insurance card and copay.

## 2014-10-14 ENCOUNTER — Other Ambulatory Visit (INDEPENDENT_AMBULATORY_CARE_PROVIDER_SITE_OTHER): Payer: Self-pay | Admitting: Surgery

## 2014-10-14 NOTE — H&P (Signed)
Jonathan Taylor. Jonathan Taylor 10/14/2014 1:53 PM Location: [facility] Patient #: [id number] DOB: 13-Apr-1986 Single / Language: Jonathan Taylor / Race: Black or African American Male History of Present Illness Jonathan Hector MD; 10/14/2014 2:46 PM) Patient words: anal fistula.  The patient is a 28 year old male who presents with a complaint of Fistula. Patient sent for surgical consultation request by his gastroenterologist Dr. Zenovia Taylor for recurrent anal fistula. Pleasant morbidly obese male that has struggled with perianal pain and infections for the past few years. Had an abscess that required drainage and superficial fistulotomy in Oct 2011. A few months later things seemed to open up again. Discussion about repeat surgery. Tried to hold off through the year. And up seeing a different surgeon the group and had superficial fistulotomy 2 in Oct 2012. Eventually very healed up. He notes that he had not have problems for 2 years. However a few months ago he started having pain and drainage. Foul odor. Concerned him. Saw gastroenterology. Colonoscopy biopsy showed no evidence of Crohn's or other abnormality. Suspicious internal anal canal opening noted. Very suspicious for recurrent fistula. Recommended surgical reevaluation. Patient's mother wished a Museum/gallery conservator. Therefore sent to me. Patient has occasional sharp pains. He does work for left and other incidents activity. He is trying to avoid work. He is getting married soon and trying to keep time off for honeymoon. Hoping to have surgery before Christmas. Has not he had a small abscess boil in the suprapubic region and he popped on his own. No history of other abscesses that he knows of. Diagnostic Studies History Jonathan Taylor, CMA; 10/14/2014 1:53 PM) Colonoscopy within last year  Medication History Jonathan Taylor, CMA; 10/14/2014 1:55 PM) Ciprofloxacin HCl (500MG  Tablet, Oral) Active. MetroNIDAZOLE (500MG  Tablet, Oral)  Active. Meloxicam (7.5MG  Tablet, Oral) Active. Hydrocodone-Acetaminophen (5-325MG  Tablet, Oral as needed) Active.  Social History ([place]; 10/14/2014 1:53 PM) Alcohol use Occasional alcohol use. Caffeine use Tea. Illicit drug use Remotely quit drug use. Tobacco use Current every day smoker.  Family History Jonathan Taylor, CMA; 10/14/2014 1:53 PM) Arthritis Mother. Diabetes Mellitus Mother. Hypertension Mother.     Review of Systems (Jonathan Taylor; 10/14/2014 1:53 PM) General Present- Weight Gain. Not Present- Appetite Loss, Chills, Fatigue, Fever, Night Sweats and Weight Loss. Skin Not Present- Change in Wart/Mole, Dryness, Hives, Jaundice, New Lesions, Non-Healing Wounds, Rash and Ulcer. Gastrointestinal Present- Rectal Pain. Not Present- Abdominal Pain, Bloating, Bloody Stool, Change in Bowel Habits, Chronic diarrhea, Constipation, Difficulty Swallowing, Excessive gas, Gets full quickly at meals, Hemorrhoids, Indigestion, Nausea and Vomiting.  Vitals (Jonathan Taylor CMA; 10/14/2014 1:54 PM) 10/14/2014 1:54 PM Weight: 359 lb Height: 76in Body Surface Area: 2.95 m Body Mass Index: 43.7 kg/m Temp.: 76F(Temporal)  Pulse: 79 (Regular)  BP: 136/76 (Sitting, Left Arm, Standard)     Physical Exam Jonathan Hector MD; 10/14/2014 2:22 PM)  General Mental Status-Alert. General Appearance-Not in acute distress, Not Sickly. Orientation-Oriented X3. Hydration-Well hydrated. Voice-Normal.  Integumentary Global Assessment Upon inspection and palpation of skin surfaces of the - Axillae: non-tender, no inflammation or ulceration, no drainage. and Distribution of scalp and body hair is normal. General Characteristics Temperature - normal warmth is noted.  Head and Neck Head-normocephalic, atraumatic with no lesions or palpable masses. Face Global Assessment - atraumatic, no absence of expression. Neck Global Assessment - no abnormal movements,  no bruit auscultated on the right, no bruit auscultated on the left, no decreased range of motion, non-tender. Trachea-midline. Thyroid Gland Characteristics - non-tender.  Eye Eyeball - Left-Extraocular movements intact, No Nystagmus. Eyeball - Right-Extraocular movements intact, No Nystagmus. Cornea - Left-No Hazy. Cornea - Right-No Hazy. Sclera/Conjunctiva - Left-No scleral icterus, No Discharge. Sclera/Conjunctiva - Right-No scleral icterus, No Discharge. Pupil - Left-Direct reaction to light normal. Pupil - Right-Direct reaction to light normal.  ENMT Ears Pinna - Left - no drainage observed, no generalized tenderness observed. Right - no drainage observed, no generalized tenderness observed. Nose and Sinuses External Inspection of the Nose - no destructive lesion observed. Inspection of the nares - Left - quiet respiration. Right - quiet respiration. Mouth and Throat Lips - Upper Lip - no fissures observed, no pallor noted. Lower Lip - no fissures observed, no pallor noted. Nasopharynx - no discharge present. Oral Cavity/Oropharynx - Tongue - no dryness observed. Oral Mucosa - no cyanosis observed. Hypopharynx - no evidence of airway distress observed.  Chest and Lung Exam Inspection Movements - Normal and Symmetrical. Accessory muscles - No use of accessory muscles in breathing. Palpation Palpation of the chest reveals - Non-tender. Auscultation Breath sounds - Normal and Clear.  Cardiovascular Auscultation Rhythm - Regular. Murmurs & Other Heart Sounds - Auscultation of the heart reveals - No Murmurs and No Systolic Clicks.  Abdomen Inspection Inspection of the abdomen reveals - No Visible peristalsis and No Abnormal pulsations. Umbilicus - No Bleeding, No Urine drainage. Palpation/Percussion Palpation and Percussion of the abdomen reveal - Soft, Non Tender, No Rebound tenderness, No Rigidity (guarding) and No Cutaneous hyperesthesia. Note: Obese but  soft. Mild sensitivity right rib cage   Male Genitourinary Sexual Maturity Tanner 5 - Adult hair pattern and Adult penile size and shape. Note: Normal external male genitalia. Uncircumcised. No hernias. No testicular masses. No warts.   Rectal Note: Large gluteal region with deep crevice. Perianal skin clean. No evidence of external hemorrhoid. No prolapsing internal hemorrhoids. No anal fissure. No pilonidal disease. Scarring left lateral perianal region. Obvious scarring and dimpling 1 cm from anal verge. Very sensitive out to 5cm. Superficial cord felt to the sphincter. Cannot tolerate sphincter exam well. Normal sphincter tone. Obese so unable to to do good digital or anoscopic exam.   Peripheral Vascular Upper Extremity Inspection - Left - No Cyanotic nailbeds, Not Ischemic. Right - No Cyanotic nailbeds, Not Ischemic.  Neurologic Neurologic evaluation reveals -normal attention span and ability to concentrate, able to name objects and repeat phrases. Appropriate fund of knowledge , normal sensation and normal coordination. Mental Status Affect - not angry, not paranoid. Cranial Nerves-Normal Bilaterally. Gait-Normal.  Neuropsychiatric Mental status exam performed with findings of-able to articulate well with normal speech/language, rate, volume and coherence, thought content normal with ability to perform basic computations and apply abstract reasoning and no evidence of hallucinations, delusions, obsessions or homicidal/suicidal ideation.  Musculoskeletal Global Assessment Spine, Ribs and Pelvis - no instability, subluxation or laxity. Right Upper Extremity - no instability, subluxation or laxity.  Lymphatic Head & Neck  General Head & Neck Lymphatics: Bilateral - Description - No Localized lymphadenopathy. Axillary  General Axillary Region: Bilateral - Description - No Localized lymphadenopathy. Femoral & Inguinal  Generalized Femoral & Inguinal Lymphatics:  Left - Description - No Localized lymphadenopathy. Right - Description - No Localized lymphadenopathy.    Assessment & Plan Jonathan Hector MD; 10/14/2014 2:45 PM)  ANAL FISTULA (565.1  K60.3) Impression: Persistent left lateral perianal pain and probable fistulous disease. No strong evidence of inflammatory bowel disease by biopsy or serology. Suspect intersphincteric component given recurrence and suspicious anal opening. I  think he would benefit from examination under anesthesia with possible intersphincteric LIFT repair vs seton if persistent.  I also noted that smokers often have recurrent infections. I strongly recommend that he quit smoking. QUIT SMOKING QUIT SMOKING QUIT SMOKING  Current Plans Schedule for Surgery [facility] Consent - Anal Abscess / Fistula (Rashawd Laskaris): discussed with patient and provided information. Pt Education - [facility] Abscess/Fistula (AT) Pt Education - [facility] Rectal Surgery HCI (Nahla Lukin): discussed with patient and provided information. Pt Education - [facility] Good Bowel Health (Carl Bleecker)  Jonathan Taylor, M.D., F.A.C.S. Gastrointestinal and Minimally Invasive Surgery [facility] [address], [place], [place] [address] [phone] Main / Paging

## 2014-11-16 ENCOUNTER — Encounter: Payer: Self-pay | Admitting: Internal Medicine

## 2014-11-16 ENCOUNTER — Other Ambulatory Visit (INDEPENDENT_AMBULATORY_CARE_PROVIDER_SITE_OTHER): Payer: BLUE CROSS/BLUE SHIELD

## 2014-11-16 ENCOUNTER — Ambulatory Visit (INDEPENDENT_AMBULATORY_CARE_PROVIDER_SITE_OTHER): Payer: BLUE CROSS/BLUE SHIELD | Admitting: Geriatric Medicine

## 2014-11-16 ENCOUNTER — Ambulatory Visit (INDEPENDENT_AMBULATORY_CARE_PROVIDER_SITE_OTHER): Payer: BLUE CROSS/BLUE SHIELD | Admitting: Internal Medicine

## 2014-11-16 VITALS — BP 138/82 | HR 83 | Temp 99.0°F | Resp 16 | Ht 78.0 in | Wt 362.0 lb

## 2014-11-16 DIAGNOSIS — Z113 Encounter for screening for infections with a predominantly sexual mode of transmission: Secondary | ICD-10-CM

## 2014-11-16 DIAGNOSIS — Z299 Encounter for prophylactic measures, unspecified: Secondary | ICD-10-CM

## 2014-11-16 DIAGNOSIS — K603 Anal fistula: Secondary | ICD-10-CM

## 2014-11-16 DIAGNOSIS — Z23 Encounter for immunization: Secondary | ICD-10-CM

## 2014-11-16 DIAGNOSIS — Z418 Encounter for other procedures for purposes other than remedying health state: Secondary | ICD-10-CM

## 2014-11-16 LAB — CBC
HCT: 43 % (ref 39.0–52.0)
Hemoglobin: 13.7 g/dL (ref 13.0–17.0)
MCHC: 31.9 g/dL (ref 30.0–36.0)
MCV: 80.6 fl (ref 78.0–100.0)
Platelets: 226 10*3/uL (ref 150.0–400.0)
RBC: 5.33 Mil/uL (ref 4.22–5.81)
RDW: 13.4 % (ref 11.5–15.5)
WBC: 7.5 10*3/uL (ref 4.0–10.5)

## 2014-11-16 LAB — HEMOGLOBIN A1C: Hgb A1c MFr Bld: 7.2 % — ABNORMAL HIGH (ref 4.6–6.5)

## 2014-11-16 NOTE — Patient Instructions (Signed)
We will check your blood work today including testosterone and STD screening. You will hear back about that on the computer or via phone.   Work on losing some weight for your health. Remember to call the surgeon's office to schedule your surgery.   Serving Sizes What we call a serving size today is larger than it was in the past. A 1950s fast-food burger contained little more than 1 oz of meat, and a soft drink was 8 oz (1 cup). Today, a "quarter pounder" burger is at least 4 times that amount, and a 32 or 64 oz drink is not uncommon. A possible guide for eating when trying to lose weight is to eat about half as much as you normally do. Some estimates of serving sizes are:  1 Dairy serving:Individual container of yogurt (8 oz) or piece of cheese the size of your thumb (1 oz).  1 Grain serving: 1 slice of bread or  cup pasta.  1 Meat serving: The size of a deck of cards (3 oz).  1 Fruit serving: cup canned fruit or 1 medium fruit.  1 Vegetable serving:  cup of cooked or canned vegetables.  1 Fat serving:The size of 4 stacked dimes. Experts suggest spending 1 or 2 days measuring food portions you commonly eat. This will give you better practice at estimating serving sizes, and will also show whether you are eating an appropriate amount of food to meet your weight goals. If you find that you are eating more than you thought, try measuring your food for a few days so you can "reprogram" yourself to learn what makes a healthy portion for you. SUGGESTIONS FOR CONTROL  In restaurants, share entrees, or ask the waiter to put half the entre in a box or bag before you even touch it.  Order lunch-sized portions. Many restaurants serve 4 to 6 oz of meat at lunch, compared with 8 to 10 oz at dinner.  Split dessert or skip it all together. Have a piece of fruit when you get home.  At home, use smaller plates and bowls. It will look as if you are eating more.  Plate your food in the kitchen  rather than serving it "family style" at the table.  Wait 20 to 30 minutes before taking seconds. This is how long it takes your brain to recognize that you are full.  Check food labels for serving sizes. Eat 1 serving only.  Use measuring cups and spoons to see proper serving sizes.  Buy smaller packages of candy, popcorn, and snacks.  Avoid eating directly out of the bag or carton.  While eating half as much, exercise twice as much. Park further away from the mall, take the stairs instead of the escalator, and walk around your block. Losing weight is a slow, difficult process. It takes long-lasting lifestyle changes. You can make gradual changes over time so they become habits. Look to friends and family to support the healthy changes you are making. Avoid fad diets since they are often only temporary weight loss solutions. Document Released: 07/22/2003 Document Revised: 01/15/2012 Document Reviewed: 01/20/2014 [facility] Patient Information 2015 [place], [place]. This information is not intended to replace advice given to you by your health care provider. Make sure you discuss any questions you have with your health care provider.

## 2014-11-16 NOTE — Progress Notes (Signed)
Pre visit review using our clinic review tool, if applicable. No additional management support is needed unless otherwise documented below in the visit note. 

## 2014-11-17 LAB — LIPID PANEL
Cholesterol: 145 mg/dL (ref 0–200)
HDL: 40 mg/dL (ref >39.00)
LDL Cholesterol: 94 mg/dL (ref 0–99)
NonHDL: 105
Total CHOL/HDL Ratio: 4
Triglycerides: 57 mg/dL (ref 0.0–149.0)
VLDL: 11.4 mg/dL (ref 0.0–40.0)

## 2014-11-17 LAB — TESTOSTERONE, FREE, TOTAL, SHBG
Sex Hormone Binding: 30 nmol/L (ref 10–50)
Testosterone, Free: 59.8 pg/mL (ref 47.0–244.0)
Testosterone-% Free: 2.1 % (ref 1.6–2.9)
Testosterone: 289 ng/dL — ABNORMAL LOW (ref 300–890)

## 2014-11-17 LAB — COMPREHENSIVE METABOLIC PANEL
ALT: 43 U/L (ref 0–53)
AST: 35 U/L (ref 0–37)
Albumin: 3.9 g/dL (ref 3.5–5.2)
Alkaline Phosphatase: 74 U/L (ref 39–117)
BUN: 14 mg/dL (ref 6–23)
CO2: 27 mEq/L (ref 19–32)
Calcium: 9.4 mg/dL (ref 8.4–10.5)
Chloride: 107 mEq/L (ref 96–112)
Creatinine, Ser: 1 mg/dL (ref 0.4–1.5)
GFR: 118.15 mL/min (ref >60.00)
Glucose, Bld: 119 mg/dL — ABNORMAL HIGH (ref 70–99)
Potassium: 3.9 mEq/L (ref 3.5–5.1)
Sodium: 138 mEq/L (ref 135–145)
Total Bilirubin: 0.4 mg/dL (ref 0.2–1.2)
Total Protein: 7.1 g/dL (ref 6.0–8.3)

## 2014-11-17 LAB — HIV ANTIBODY (ROUTINE TESTING W REFLEX): HIV 1&2 Ab, 4th Generation: NONREACTIVE

## 2014-11-17 LAB — RPR

## 2014-11-17 NOTE — Assessment & Plan Note (Signed)
Likely no need for further antibiotics today although declined exam. Advised him to call surgeon to resolve this with surgery if needed.

## 2014-11-17 NOTE — Progress Notes (Signed)
   Subjective:    Patient ID: Jonathan Taylor, male    DOB: 1985/12/08, 29 y.o.   MRN: [id number]  HPI The patient is a 29 YO man who is coming in today to establish care. He has PMH of anal fistula which has recurred 3 times and is in need of current surgery now. He was supposed to call the surgeon back this week. He has PMH of morbid obesity, tobacco abuse. His diet is not ideal and he is planning to start a diet next month as he is having a contest with some couple friends on who can lose the most weight. He will also be going back to the gym. No SOB with exertion, denies chest pains. Denies GERD, constipation, diarrhea. He would like to get checked for STDs as he was married about 3 months ago and just wants to make sure since it has been a long time since he was checked. He is not having any problems and denies penile lesion, pain, discharge.   Review of Systems  Constitutional: Negative for fever, chills, activity change, appetite change, fatigue and unexpected weight change.  HENT: Negative.   Respiratory: Negative for cough, chest tightness, shortness of breath and wheezing.   Cardiovascular: Negative for chest pain and palpitations.  Gastrointestinal: Positive for abdominal pain. Negative for diarrhea, constipation, blood in stool and abdominal distention.       From anal fistula with passing stool  Genitourinary: Negative.   Musculoskeletal: Negative.   Skin: Negative.   Neurological: Negative.   Psychiatric/Behavioral: Negative.       Objective:   Physical Exam  Constitutional: He is oriented to person, place, and time. He appears well-developed and well-nourished.  Morbidly obese  HENT:  Head: Normocephalic and atraumatic.  Eyes: EOM are normal.  Neck: Normal range of motion.  Cardiovascular: Normal rate and regular rhythm.   Pulmonary/Chest: Effort normal and breath sounds normal. No respiratory distress. He has no wheezes. He has no rales.  Abdominal: Soft. Bowel sounds are  normal. He exhibits no distension. There is no tenderness. There is no rebound.  Declined exam of fistula  Musculoskeletal: He exhibits no edema.  Neurological: He is alert and oriented to person, place, and time. Coordination normal.  Skin: Skin is warm and dry.  Psychiatric: He has a normal mood and affect. His behavior is normal.   Filed Vitals:   11/16/14 1549  BP: 138/82  Pulse: 83  Temp: 99 F (37.2 C)  TempSrc: Oral  Resp: 16  Height: 6\' 6"  (1.981 m)  Weight: 362 lb (164.202 kg)  SpO2: 98%      Assessment & Plan:

## 2014-11-17 NOTE — Assessment & Plan Note (Addendum)
Will check lipid panel, BMP, testosterone level (talked with him about the fact that it will likely be low as fat can metabolize testosterone to estrogen), HgA1c. Talked with him about weight loss and the fact that if his weight is not affecting him now it likely will in the near future.

## 2014-11-18 ENCOUNTER — Telehealth: Payer: Self-pay | Admitting: Internal Medicine

## 2014-11-18 ENCOUNTER — Other Ambulatory Visit: Payer: Self-pay | Admitting: Internal Medicine

## 2014-11-18 LAB — GC/CHLAMYDIA PROBE AMP, URINE
Chlamydia, Swab/Urine, PCR: NEGATIVE
GC Probe Amp, Urine: NEGATIVE

## 2014-11-18 MED ORDER — METFORMIN HCL 500 MG PO TABS: 500.0000 mg | ORAL_TABLET | Freq: Two times a day (BID) | ORAL | Status: DC

## 2014-11-18 NOTE — Telephone Encounter (Signed)
Patient mother Margret Chance hear results of 11/16/2014 visit.

## 2014-11-19 NOTE — Telephone Encounter (Signed)
Mother called and wants tests results, and diabetic information, is it type 1 or type 2.  Lynnell Grain @ [phone]

## 2014-11-19 NOTE — Telephone Encounter (Signed)
It would appear checking demographics that she is not listed as a contact and she can ask her son about his medical conditions and if he has questions he can call us.

## 2014-12-07 ENCOUNTER — Other Ambulatory Visit: Payer: Self-pay | Admitting: *Deleted

## 2014-12-07 ENCOUNTER — Encounter: Payer: Self-pay | Admitting: *Deleted

## 2014-12-07 ENCOUNTER — Telehealth: Payer: Self-pay | Admitting: Internal Medicine

## 2014-12-07 ENCOUNTER — Encounter: Payer: Self-pay | Admitting: Nurse Practitioner

## 2014-12-07 ENCOUNTER — Ambulatory Visit (INDEPENDENT_AMBULATORY_CARE_PROVIDER_SITE_OTHER): Payer: BLUE CROSS/BLUE SHIELD | Admitting: Nurse Practitioner

## 2014-12-07 VITALS — BP 124/80 | HR 100 | Temp 99.7°F | Ht 78.0 in | Wt 357.0 lb

## 2014-12-07 DIAGNOSIS — J111 Influenza due to unidentified influenza virus with other respiratory manifestations: Secondary | ICD-10-CM

## 2014-12-07 MED ORDER — ALBUTEROL SULFATE HFA 108 (90 BASE) MCG/ACT IN AERS: 2.0000 | INHALATION_SPRAY | RESPIRATORY_TRACT | Status: DC | PRN

## 2014-12-07 MED ORDER — METFORMIN HCL 500 MG PO TABS: 500.0000 mg | ORAL_TABLET | Freq: Two times a day (BID) | ORAL | Status: DC

## 2014-12-07 MED ORDER — OSELTAMIVIR PHOSPHATE 75 MG PO CAPS: 75.0000 mg | ORAL_CAPSULE | Freq: Two times a day (BID) | ORAL | Status: DC

## 2014-12-07 NOTE — Telephone Encounter (Signed)
emmi mailed  °

## 2014-12-07 NOTE — Progress Notes (Signed)
Pre visit review using our clinic review tool, if applicable. No additional management support is needed unless otherwise documented below in the visit note. 

## 2014-12-07 NOTE — Patient Instructions (Signed)
You likely have flu. The average duration is 5-10 days. Treatment is largely symptom management. For sinus congestion, start daily sinus rinses (neilmed Sinus Rinse). For aches & fever alternate tylenol & ibuprophen every 4-6 hours. Sip fluids every hour. Rest. Use inhaler twice daily for 3-4 days then as needed. Rinse throat with salt water gargles (1/4 teaspoon salt mixed with 1/4 cup water) & listerene twice daily. If you are not feeling better in 1 week or develop fever or chest pain, call us for re-evaluation. Feel better!    Influenza A (H1N1) H1N1 formerly called "swine flu" is a new influenza virus causing sickness in people. The H1N1 virus is different from seasonal influenza viruses. However, the H1N1 symptoms are similar to seasonal influenza and it is spread from person to person. You may be at higher risk for serious problems if you have underlying serious medical conditions. The CDC and the [facility] are following reported cases around the world. CAUSES   The flu is thought to spread mainly person-to-person through coughing or sneezing of infected people.  A person may become infected by touching something with the virus on it and then touching their mouth or nose. SYMPTOMS   Fever.  Headache.  Tiredness.  Cough.  Sore throat.  Runny or stuffy nose.  Body aches.  Diarrhea and vomiting These symptoms are referred to as "flu-like symptoms." A lot of different illnesses, including the common cold, may have similar symptoms. DIAGNOSIS   There are tests that can tell if you have the H1N1 virus.  Confirmed cases of H1N1 will be reported to the state or local health department.  A doctor's exam may be needed to tell whether you have an infection that is a complication of the flu. HOME CARE INSTRUCTIONS   Stay informed. Visit the [facility] website for current recommendations. Visit [facility]. You may also call 1-800-CDC-INFO (226)592-7930).  Get  help early if you develop any of the above symptoms.  If you are at high risk from complications of the flu, talk to your caregiver as soon as you develop flu-like symptoms. Those at higher risk for complications include:  People 65 years or older.  People with chronic medical conditions.  Pregnant women.  Young children.  Your caregiver may recommend antiviral medicine to help treat the flu.  If you get the flu, get plenty of rest, drink enough water and fluids to keep your urine clear or pale yellow, and avoid using alcohol or tobacco.  You may take over-the-counter medicine to relieve the symptoms of the flu if your caregiver approves. (Never give aspirin to children or teenagers who have flu-like symptoms, particularly fever). TREATMENT  If you do get sick, antiviral drugs are available. These drugs can make your illness milder and make you feel better faster. Treatment should start soon after illness starts. It is only effective if taken within the first day of becoming ill. Only your caregiver can prescribe antiviral medication.  PREVENTION   Cover your nose and mouth with a tissue or your arm when you cough or sneeze. Throw the tissue away.  Wash your hands often with soap and warm water, especially after you cough or sneeze. Alcohol-based cleaners are also effective against germs.  Avoid touching your eyes, nose or mouth. This is one way germs spread.  Try to avoid contact with sick people. Follow public health advice regarding school closures. Avoid crowds.  Stay home if you get sick. Limit contact with others to keep from infecting  them. People infected with the H1N1 virus may be able to infect others anywhere from 1 day before feeling sick to 5-7 days after getting flu symptoms.  An H1N1 vaccine is available to help protect against the virus. In addition to the H1N1 vaccine, you will need to be vaccinated for seasonal influenza. The H1N1 and seasonal vaccines may be given on  the same day. The CDC especially recommends the H1N1 vaccine for:  Pregnant women.  People who live with or care for children younger than 66 months of age.  Health care and emergency services personnel.  Persons between the ages of 20 months through 62 years of age.  People from ages 55 through 25 years who are at higher risk for H1N1 because of chronic health disorders or immune system problems. FACEMASKS In community and home settings, the use of facemasks and N95 respirators are not normally recommended. In certain circumstances, a facemask or N95 respirator may be used for persons at increased risk of severe illness from influenza. Your caregiver can give additional recommendations for facemask use. IN CHILDREN, EMERGENCY WARNING SIGNS THAT NEED URGENT MEDICAL CARE:  Fast breathing or trouble breathing.  Bluish skin color.  Not drinking enough fluids.  Not waking up or not interacting normally.  Being so fussy that the child does not want to be held.  Your child has an oral temperature above 102 F (38.9 C), not controlled by medicine.  Your baby is older than 3 months with a rectal temperature of 102 F (38.9 C) or higher.  Your baby is 65 months old or younger with a rectal temperature of 100.4 F (38 C) or higher.  Flu-like symptoms improve but then return with fever and worse cough. IN ADULTS, EMERGENCY WARNING SIGNS THAT NEED URGENT MEDICAL CARE:  Difficulty breathing or shortness of breath.  Pain or pressure in the chest or abdomen.  Sudden dizziness.  Confusion.  Severe or persistent vomiting.  Bluish color.  You have a oral temperature above 102 F (38.9 C), not controlled by medicine.  Flu-like symptoms improve but return with fever and worse cough. SEEK IMMEDIATE MEDICAL CARE IF:  You or someone you know is experiencing any of the above symptoms. When you arrive at the emergency center, report that you think you have the flu. You may be asked to wear  a mask and/or sit in a secluded area to protect others from getting sick. MAKE SURE YOU:   Understand these instructions.  Will watch your condition.  Will get help right away if you are not doing well or get worse. Some of this information courtesy of the CDC.  Document Released: 04/10/2008 Document Revised: 01/15/2012 Document Reviewed: 04/10/2008 [facility] Patient Information 2014 [place], [place].

## 2014-12-09 NOTE — Progress Notes (Signed)
   Subjective:    Patient ID: Jonathan Taylor, male    DOB: 30-Aug-1986, 29 y.o.   MRN: [id number]  HPI Comments: Pt reports childhood asthma  URI  This is a new problem. The current episode started in the past 7 days (3d). The problem has been gradually worsening. The maximum temperature recorded prior to his arrival was 102 - 102.9 F. The fever has been present for 1 to 2 days. Associated symptoms include congestion, coughing, headaches and a plugged ear sensation. Pertinent negatives include no chest pain, diarrhea, ear pain, nausea, sinus pain, vomiting or wheezing. Associated symptoms comments: Chest feels tight. Treatments tried: mucinex. The treatment provided no relief.      Review of Systems  Constitutional: Positive for fever, chills and fatigue.  HENT: Positive for congestion. Negative for ear pain.   Respiratory: Positive for cough and chest tightness. Negative for shortness of breath and wheezing.   Cardiovascular: Negative for chest pain.  Gastrointestinal: Negative for nausea, vomiting and diarrhea.  Neurological: Positive for headaches.       Objective:   Physical Exam  Constitutional: He is oriented to person, place, and time. He appears well-developed and well-nourished. No distress.  HENT:  Head: Normocephalic and atraumatic.  Right Ear: External ear normal.  Left Ear: External ear normal.  Mouth/Throat: Oropharyngeal exudate present.  Exudate L tonsil. Tonsils +2 bilat  Eyes: Conjunctivae are normal. Right eye exhibits no discharge. Left eye exhibits no discharge.  Neck: Normal range of motion. Neck supple. No thyromegaly present.  Cardiovascular: Normal rate, regular rhythm and normal heart sounds.   No murmur heard. Pulmonary/Chest: Effort normal and breath sounds normal. No respiratory distress. He has no wheezes. He has no rales.  Lymphadenopathy:    He has no cervical adenopathy.  Neurological: He is alert and oriented to person, place, and time.  Skin: Skin  is warm and dry.  Psychiatric: He has a normal mood and affect. His behavior is normal. Thought content normal.  Vitals reviewed.         Assessment & Plan:  1. Influenza - oseltamivir (TAMIFLU) 75 MG capsule; Take 1 capsule (75 mg total) by mouth 2 (two) times daily.  Dispense: 10 capsule; Refill: 0 - albuterol (PROVENTIL HFA;VENTOLIN HFA) 108 (90 BASE) MCG/ACT inhaler; Inhale 2 puffs into the lungs every 4 (four) hours as needed for wheezing.  Dispense: 1 Inhaler; Refill: 0 Symptom support: sinus rinse, listerene gargles, salt water gargles See pt instructions F/u PRN

## 2015-03-12 ENCOUNTER — Ambulatory Visit (INDEPENDENT_AMBULATORY_CARE_PROVIDER_SITE_OTHER): Payer: BLUE CROSS/BLUE SHIELD | Admitting: Internal Medicine

## 2015-03-12 ENCOUNTER — Encounter: Payer: Self-pay | Admitting: Internal Medicine

## 2015-03-12 ENCOUNTER — Other Ambulatory Visit (INDEPENDENT_AMBULATORY_CARE_PROVIDER_SITE_OTHER): Payer: BLUE CROSS/BLUE SHIELD

## 2015-03-12 VITALS — BP 132/100 | HR 82 | Temp 98.8°F | Resp 16 | Wt 361.1 lb

## 2015-03-12 DIAGNOSIS — E1169 Type 2 diabetes mellitus with other specified complication: Secondary | ICD-10-CM

## 2015-03-12 DIAGNOSIS — Z202 Contact with and (suspected) exposure to infections with a predominantly sexual mode of transmission: Secondary | ICD-10-CM | POA: Diagnosis not present

## 2015-03-12 DIAGNOSIS — E119 Type 2 diabetes mellitus without complications: Secondary | ICD-10-CM

## 2015-03-12 DIAGNOSIS — E669 Obesity, unspecified: Secondary | ICD-10-CM

## 2015-03-12 DIAGNOSIS — N469 Male infertility, unspecified: Secondary | ICD-10-CM

## 2015-03-12 LAB — MICROALBUMIN / CREATININE URINE RATIO
Creatinine,U: 370.8 mg/dL
Microalb Creat Ratio: 0.9 mg/g (ref 0.0–30.0)
Microalb, Ur: 3.2 mg/dL — ABNORMAL HIGH (ref 0.0–1.9)

## 2015-03-12 LAB — HEMOGLOBIN A1C: Hgb A1c MFr Bld: 6.8 % — ABNORMAL HIGH (ref 4.6–6.5)

## 2015-03-12 MED ORDER — HYDROCODONE-ACETAMINOPHEN 5-325 MG PO TABS: 1.0000 | ORAL_TABLET | Freq: Four times a day (QID) | ORAL | Status: DC | PRN

## 2015-03-12 MED ORDER — METFORMIN HCL 500 MG PO TABS: 500.0000 mg | ORAL_TABLET | Freq: Two times a day (BID) | ORAL | Status: DC

## 2015-03-12 NOTE — Progress Notes (Signed)
Pre visit review using our clinic review tool, if applicable. No additional management support is needed unless otherwise documented below in the visit note. 

## 2015-03-12 NOTE — Patient Instructions (Addendum)
We have sent in the metformin so take 1 pill a day for the first week then increase to 1 pill twice a day.   We have given you the pain medicine until you can have the surgery.   We are going to check you for STDs today and call you with the results.   The urologist will call you about scheduling that appointment.   Come back in about 6 months so we can check on the sugars.   Diabetes and Exercise Exercising regularly is important. It is not just about losing weight. It has many health benefits, such as:  Improving your overall fitness, flexibility, and endurance.  Increasing your bone density.  Helping with weight control.  Decreasing your body fat.  Increasing your muscle strength.  Reducing stress and tension.  Improving your overall health. People with diabetes who exercise gain additional benefits because exercise:  Reduces appetite.  Improves the body's use of blood sugar (glucose).  Helps lower or control blood glucose.  Decreases blood pressure.  Helps control blood lipids (such as cholesterol and triglycerides).  Improves the body's use of the hormone insulin by:  Increasing the body's insulin sensitivity.  Reducing the body's insulin needs.  Decreases the risk for heart disease because exercising:  Lowers cholesterol and triglycerides levels.  Increases the levels of good cholesterol (such as high-density lipoproteins [HDL]) in the body.  Lowers blood glucose levels. YOUR ACTIVITY PLAN  Choose an activity that you enjoy and set realistic goals. Your health care provider or diabetes educator can help you make an activity plan that works for you. Exercise regularly as directed by your health care provider. This includes:  Performing resistance training twice a week such as push-ups, sit-ups, lifting weights, or using resistance bands.  Performing 150 minutes of cardio exercises each week such as walking, running, or playing sports.  Staying active and  spending no more than 90 minutes at one time being inactive. Even short bursts of exercise are good for you. Three 10-minute sessions spread throughout the day are just as beneficial as a single 30-minute session. Some exercise ideas include:  Taking the dog for a walk.  Taking the stairs instead of the elevator.  Dancing to your favorite song.  Doing an exercise video.  Doing your favorite exercise with a friend. RECOMMENDATIONS FOR EXERCISING WITH TYPE 1 OR TYPE 2 DIABETES   Check your blood glucose before exercising. If blood glucose levels are greater than 240 mg/dL, check for urine ketones. Do not exercise if ketones are present.  Avoid injecting insulin into areas of the body that are going to be exercised. For example, avoid injecting insulin into:  The arms when playing tennis.  The legs when jogging.  Keep a record of:  Food intake before and after you exercise.  Expected peak times of insulin action.  Blood glucose levels before and after you exercise.  The type and amount of exercise you have done.  Review your records with your health care provider. Your health care provider will help you to develop guidelines for adjusting food intake and insulin amounts before and after exercising.  If you take insulin or oral hypoglycemic agents, watch for signs and symptoms of hypoglycemia. They include:  Dizziness.  Shaking.  Sweating.  Chills.  Confusion.  Drink plenty of water while you exercise to prevent dehydration or heat stroke. Body water is lost during exercise and must be replaced.  Talk to your health care provider before starting an exercise  program to make sure it is safe for you. Remember, almost any type of activity is better than none. Document Released: 01/13/2004 Document Revised: 03/09/2014 Document Reviewed: 04/01/2013 [facility] Patient Information 2015 [place], [place]. This information is not intended to replace advice given to you by your health  care provider. Make sure you discuss any questions you have with your health care provider.   Diabetes and Standards of Medical Care Diabetes is complicated. You may find that your diabetes team includes a dietitian, nurse, diabetes educator, eye doctor, and more. To help everyone know what is going on and to help you get the care you deserve, the following schedule of care was developed to help keep you on track. Below are the tests, exams, vaccines, medicines, education, and plans you will need. HbA1c test This test shows how well you have controlled your glucose over the past 2-3 months. It is used to see if your diabetes management plan needs to be adjusted.   It is performed at least 2 times a year if you are meeting treatment goals.  It is performed 4 times a year if therapy has changed or if you are not meeting treatment goals. Blood pressure test  This test is performed at every routine medical visit. The goal is less than 140/90 mm Hg for most people, but 130/80 mm Hg in some cases. Ask your health care provider about your goal. Dental exam  Follow up with the dentist regularly. Eye exam  If you are diagnosed with type 1 diabetes as a child, get an exam upon reaching the age of 45 years or older and have had diabetes for 3-5 years. Yearly eye exams are recommended after that initial eye exam.  If you are diagnosed with type 1 diabetes as an adult, get an exam within 5 years of diagnosis and then yearly.  If you are diagnosed with type 2 diabetes, get an exam as soon as possible after the diagnosis and then yearly. Foot care exam  Visual foot exams are performed at every routine medical visit. The exams check for cuts, injuries, or other problems with the feet.  A comprehensive foot exam should be done yearly. This includes visual inspection as well as assessing foot pulses and testing for loss of sensation.  Check your feet nightly for cuts, injuries, or other problems with  your feet. Tell your health care provider if anything is not healing. Kidney function test (urine microalbumin)  This test is performed once a year.  Type 1 diabetes: The first test is performed 5 years after diagnosis.  Type 2 diabetes: The first test is performed at the time of diagnosis.  A serum creatinine and estimated glomerular filtration rate (eGFR) test is done once a year to assess the level of chronic kidney disease (CKD), if present. Lipid profile (cholesterol, HDL, LDL, triglycerides)  Performed every 5 years for most people.  The goal for LDL is less than 100 mg/dL. If you are at high risk, the goal is less than 70 mg/dL.  The goal for HDL is 40 mg/dL-50 mg/dL for men and 50 mg/dL-60 mg/dL for women. An HDL cholesterol of 60 mg/dL or higher gives some protection against heart disease.  The goal for triglycerides is less than 150 mg/dL. Influenza vaccine, pneumococcal vaccine, and hepatitis B vaccine  The influenza vaccine is recommended yearly.  It is recommended that people with diabetes who are over 40 years old get the pneumonia vaccine. In some cases, two separate shots may  be given. Ask your health care provider if your pneumonia vaccination is up to date.  The hepatitis B vaccine is also recommended for adults with diabetes. Diabetes self-management education  Education is recommended at diagnosis and ongoing as needed. Treatment plan  Your treatment plan is reviewed at every medical visit. Document Released: 08/20/2009 Document Revised: 03/09/2014 Document Reviewed: 03/25/2013 [facility] Patient Information 2015 [place], [place]. This information is not intended to replace advice given to you by your health care provider. Make sure you discuss any questions you have with your health care provider.

## 2015-03-13 LAB — GC/CHLAMYDIA PROBE AMP, URINE
Chlamydia, Swab/Urine, PCR: NEGATIVE
GC Probe Amp, Urine: NEGATIVE

## 2015-03-13 LAB — RPR

## 2015-03-13 LAB — HIV ANTIBODY (ROUTINE TESTING W REFLEX): HIV 1&2 Ab, 4th Generation: NONREACTIVE

## 2015-03-14 DIAGNOSIS — N469 Male infertility, unspecified: Secondary | ICD-10-CM | POA: Insufficient documentation

## 2015-03-14 DIAGNOSIS — E1169 Type 2 diabetes mellitus with other specified complication: Secondary | ICD-10-CM | POA: Insufficient documentation

## 2015-03-14 DIAGNOSIS — E669 Obesity, unspecified: Secondary | ICD-10-CM | POA: Insufficient documentation

## 2015-03-14 NOTE — Assessment & Plan Note (Addendum)
Wants referral to urology for check of his sperm. Trying to conceive with current partner for >71months without results. He does have previous child who is 35. Also wants checked for STDs again today so will do.

## 2015-03-14 NOTE — Assessment & Plan Note (Signed)
Spoke with him extensively about his newly diagnosed diabetes. He will pick up metformin and take 500 mg daily for one week then BID until he returns. We are working on encouraging weight loss. Check foot exam today. Check HgA1c and microalbumin to creatinine ratio. Not on ACE-I or ARB and will discuss at next visit since he is already starting one new medication today.

## 2015-03-14 NOTE — Progress Notes (Signed)
   Subjective:    Patient ID: Jonathan Taylor, male    DOB: 11/24/1985, 29 y.o.   MRN: [id number]  HPI The patient is a here for a new diagnosis of diabetes after last visit. He was not able to start taking metformin after our last visit. He has not been able to lose weight or increase his exercise. He denies excessive thirst or urination. He has a lot of questions and we focus on that.   Review of Systems  Constitutional: Negative for fever, chills, activity change, appetite change, fatigue and unexpected weight change.  HENT: Negative.   Respiratory: Negative for cough, chest tightness, shortness of breath and wheezing.   Cardiovascular: Negative for chest pain and palpitations.  Gastrointestinal: Positive for abdominal pain. Negative for diarrhea, constipation, blood in stool and abdominal distention.       From anal fistula with passing stool  Genitourinary: Negative.   Musculoskeletal: Negative.   Skin: Negative.   Neurological: Negative.   Psychiatric/Behavioral: Negative.       Objective:   Physical Exam  Constitutional: He is oriented to person, place, and time. He appears well-developed and well-nourished.  Morbidly obese  HENT:  Head: Normocephalic and atraumatic.  Eyes: EOM are normal.  Neck: Normal range of motion.  Cardiovascular: Normal rate and regular rhythm.   Pulmonary/Chest: Effort normal and breath sounds normal. No respiratory distress. He has no wheezes. He has no rales.  Abdominal: Soft. Bowel sounds are normal. He exhibits no distension. There is no tenderness. There is no rebound.  Musculoskeletal: He exhibits no edema.  Neurological: He is alert and oriented to person, place, and time. Coordination normal.  Skin: Skin is warm and dry.  Psychiatric: He has a normal mood and affect. His behavior is normal.   Filed Vitals:   03/12/15 1601  BP: 132/100  Pulse: 82  Temp: 98.8 F (37.1 C)  TempSrc: Oral  Resp: 16  Weight: 361 lb 1.9 oz (163.803 kg)  SpO2:  97%     Assessment & Plan:  Time spent on the visit 25 minutes, greater than 50% of which was spent in coordination of care and counseling with the patient about his newly diagnosed diabetes and the impact it can have on his health.

## 2015-04-26 ENCOUNTER — Other Ambulatory Visit: Payer: Self-pay | Admitting: Geriatric Medicine

## 2015-04-26 ENCOUNTER — Telehealth: Payer: Self-pay | Admitting: Internal Medicine

## 2015-04-26 MED ORDER — METFORMIN HCL 500 MG PO TABS: 500.0000 mg | ORAL_TABLET | Freq: Two times a day (BID) | ORAL | Status: DC

## 2015-04-26 NOTE — Telephone Encounter (Signed)
Sent to pharmacy 

## 2015-04-26 NOTE — Telephone Encounter (Signed)
Patient requesting refill for metFORMIN (GLUCOPHAGE) 500 MG tablet [[id number]] . Pharmacy is [facility] on [address]

## 2015-04-28 ENCOUNTER — Other Ambulatory Visit: Payer: Self-pay | Admitting: Surgery

## 2015-04-28 NOTE — H&P (Signed)
Jonathan Taylor. Stage 03/03/2015 2:24 PM Location: [facility] Patient #: [id number] DOB: 01/20/1986 Single / Language: Jonathan Taylor / Race: Black or African American Male  History of Present Illness [name] MD; 03/03/2015 3:09 PM) Patient words: Anal pain, fistula.  The patient is a 29 year old male who presents with a complaint of fistula. Patient sent for surgical consultation request by his gastroenterologist Dr. [name] for recurrent anal fistula.  Pleasant morbidly obese male that has struggled with perianal pain and infections for the past few years. Had an abscess that required drainage and superficial fistulotomy in Oct 2011. A few months later things seemed to open up again. Discussion about repeat surgery. Tried to hold off through the year. ED visits. Ended up seeing a different surgeon in the group and had superficial fistulotomy 2 in Oct 2012. Eventually healed up. He notes that he did not have problems for 2 years. However a few months ago he started having anal pain and drainage. Foul odor. Concerned him. Saw gastroenterology. Colonoscopy & biopsy showed no evidence of Crohn's or other abnormality. Suspicious internal anal canal opening noted. Very suspicious for recurrent anal fistula. Recommended surgical reevaluation. Patient's mother wished a Museum/gallery conservator. Therefore sent to me.  Patient has occasional sharp anal pains. Trying warm soaks. 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 Gets rather sore at the end of the day. He quit smoking. He wants to get scheduled. He is trying to avoid time off of work but can be painful. He if he can take anything too tight himself over until he can get the money and time blocked off. Hoping for a month or so from now. Has short-term disability set up an case at the longer recovery. He is having a rough day with worse pain and wanted to be seen and checked again. Make sure there is no worse  abscess. Doesn't know if he'll be able to get to work tomorrow.    Problem List/Past Medical [name], MD; 03/03/2015 2:51 PM) ANAL FISTULA (565.1  K60.3)  Diagnostic Studies History [name], MD; 03/03/2015 2:51 PM) Colonoscopy within last year  Allergies Elbert Ewings, CMA; 03/03/2015 2:24 PM) No Known Drug Allergies04/27/2016  Medication History Elbert Ewings, CMA; 03/03/2015 2:24 PM) Ciprofloxacin HCl (500MG  Tablet, Oral) Active. MetroNIDAZOLE (500MG  Tablet, Oral) Active. Meloxicam (7.5MG  Tablet, Oral) Active. Hydrocodone-Acetaminophen (5-325MG  Tablet, Oral as needed) Active. Medications Reconciled  Social History [name], MD; 03/03/2015 2:51 PM) Alcohol use Occasional alcohol use. Caffeine use Tea. Illicit drug use Remotely quit drug use. Tobacco use Current every day smoker.  Family History [name], MD; 03/03/2015 2:51 PM) Arthritis Mother. Diabetes Mellitus Mother. Hypertension Mother.  Review of Systems [name], MD; 03/03/2015 2:51 00) General Present- Weight Gain. Not Present- Appetite Loss, Chills, Fatigue, Fever, Night Sweats and Weight Loss. Skin Not Present- Change in Wart/Mole, Dryness, Hives, Jaundice, New Lesions, Non-Healing Wounds, Rash and Ulcer. Gastrointestinal Present- Rectal Pain. Not Present- Abdominal Pain, Bloating, Bloody Stool, Change in Bowel Habits, Chronic diarrhea, Constipation, Difficulty Swallowing, Excessive gas, Gets full quickly at meals, Hemorrhoids, Indigestion, Nausea and Vomiting.   Vitals Elbert Ewings CMA; 03/03/2015 2:25 PM) 03/03/2015 2:24 PM Weight: 364 lb Height: 76in Body Surface Area: 2.98 m Body Mass Index: 44.31 kg/m Temp.: 99.24F(Oral)  Pulse: 81 (Regular)  Resp.: 18 (Unlabored)  BP: 136/68 (Sitting, Left Arm, Standard)    Physical Exam [name] MD; 03/03/2015 2:57 PM) General Mental Status-Alert. General  Appearance-Not in acute distress, Not  Sickly. Orientation-Oriented X3. Hydration-Well hydrated. Voice-Normal.  Integumentary Global Assessment Normal Exam - Axillae: non-tender, no inflammation or ulceration, no drainage. and Distribution of scalp and body hair is normal. General Characteristics Temperature - normal warmth is noted.  Head and Neck Head-normocephalic, atraumatic with no lesions or palpable masses. Face Global Assessment - atraumatic, no absence of expression. Neck Global Assessment - no abnormal movements, no bruit auscultated on the right, no bruit auscultated on the left, no decreased range of motion, non-tender. Trachea-midline. Thyroid Gland Characteristics - non-tender.  Eye Eyeball - Left-Extraocular movements intact, No Nystagmus. Eyeball - Right-Extraocular movements intact, No Nystagmus. Cornea - Left-No Hazy. Cornea - Right-No Hazy. Sclera/Conjunctiva - Left-No scleral icterus, No Discharge. Sclera/Conjunctiva - Right-No scleral icterus, No Discharge. Pupil - Left-Direct reaction to light normal. Pupil - Right-Direct reaction to light normal.  ENMT Ears Pinna - Left - no drainage observed, no generalized tenderness observed. Right - no drainage observed, no generalized tenderness observed. Nose and Sinuses Nose - no destructive lesion observed. Nares - Left - quiet respiration. Right - quiet respiration. Mouth and Throat Lips - Upper Lip - no fissures observed, no pallor noted. Lower Lip - no fissures observed, no pallor noted. Nasopharynx - no discharge present. Oral Cavity/Oropharynx - Tongue - no dryness observed. Oral Mucosa - no cyanosis observed. Hypopharynx - no evidence of airway distress observed.  Chest and Lung Exam Inspection Movements - Normal and Symmetrical. Accessory muscles - No use of accessory muscles in breathing. Palpation Normal exam - Non-tender. Auscultation Breath sounds - Normal and Clear.  Cardiovascular Auscultation Rhythm -  Regular. Murmurs & Other Heart Sounds - Normal exam - No Murmurs and No Systolic Clicks.  Abdomen Inspection Normal Exam - No Visible peristalsis and No Abnormal pulsations. Umbilicus - No Bleeding, No Urine drainage. Palpation/Percussion Normal exam - Soft, Non Tender, No Rebound tenderness, No Rigidity (guarding) and No Cutaneous hyperesthesia. Note: Obese but soft.    Male Genitourinary Sexual Maturity Tanner 5 - Adult hair pattern and Adult penile size and shape. Note: Normal external male genitalia. Uncircumcised. No hernias. No testicular masses. No warts.   Rectal Note: Large gluteal region with deep crevice. Perianal skin clean. No evidence of external hemorrhoid. No prolapsing internal hemorrhoids. No anal fissure. No pilonidal disease.   Scarring left lateral perianal region with dimpling 1 cm from anal verge. Very sensitive out to 4cm. Superficial cord felt to the sphincter. Obese so unable to to do good digital or anoscopic exam.   Peripheral Vascular Upper Extremity Inspection - Left - No Cyanotic nailbeds, Not Ischemic. Right - No Cyanotic nailbeds, Not Ischemic.  Neurologic Neurologic evaluation reveals -normal attention span and ability to concentrate, able to name objects and repeat phrases. Appropriate fund of knowledge , normal sensation and normal coordination. Mental Status Affect - not angry, not paranoid. Cranial Nerves-Normal Bilaterally. Gait-Normal.  Neuropsychiatric Mental status exam performed with findings of-able to articulate well with normal speech/language, rate, volume and coherence, thought content normal with ability to perform basic computations and apply abstract reasoning and no evidence of hallucinations, delusions, obsessions or homicidal/suicidal ideation.  Musculoskeletal Global Assessment Spine, Ribs and Pelvis - no instability, subluxation or laxity. Right Upper Extremity - no instability, subluxation or  laxity.  Lymphatic Head & Neck General Head & Neck Lymphatics: Bilateral - Description - No Localized lymphadenopathy. Axillary General Axillary Region: Bilateral - Description - No Localized lymphadenopathy. Femoral & Inguinal Generalized Femoral & Inguinal Lymphatics: Left: Right -  Description - No Localized lymphadenopathy. Description - No Localized lymphadenopathy.    Assessment & Plan ANAL FISTULA (565.1  K60.3) Impression: Persistent left lateral perianal pain and probable fistulous disease. No strong evidence of inflammatory bowel disease by biopsy or serology. Suspect intersphincteric component given recurrence and suspicious anal opening. I think he would benefit from examination under anesthesia with possible intersphincteric LIFT repair vs seton if persistent.  I also noted that smokers often have recurrent infections. I'm glad that he has quit smoking.  We'll give him some antibiotics just in case things get worse. Okay to take more off to recover and get back to work. He is trying to get a new primary care doctor to help manage his diabetes. Schedule when he is ready. Orders still in. Current Plans  Schedule for Surgery [facility] Consent - Anal Abscess / Fistula (Verdell Kincannon): discussed with patient and provided information. Pt Education - [facility] Abscess/Fistula (AT) Pt Education - [facility] Rectal Surgery HCI (Nicola Heinemann): discussed with patient and provided information. Pt Education - [facility] Good Bowel Health (Gregg Winchell) Started Augmentin 875-125MG , 1 (one) Tablet two times daily, #10, 03/03/2015, Ref. x2. DIABETES MELLITUS, STABLE (250.00  E11.9) Current Plans Pt Education - Diabetes Mellitus: Type 2 *: diabetes mellitus Pt Education - Diabetes Mellitus: Type 2 *: metabolic  [name], M.D., F.A.C.S. Gastrointestinal and Minimally Invasive Surgery [facility] [address] [place], [place] [id number] [phone] Main / Paging

## 2015-06-15 ENCOUNTER — Encounter (HOSPITAL_COMMUNITY): Payer: Self-pay

## 2015-06-15 NOTE — Patient Instructions (Addendum)
YOUR PROCEDURE IS SCHEDULED ON :  06/29/15  REPORT TO [facility] MAIN ENTRANCE FOLLOW SIGNS TO EAST ELEVATOR - GO TO 3rd FLOOR CHECK IN AT 3 EAST NURSES STATION (SHORT STAY) AT:  5:30 AM  CALL THIS NUMBER IF YOU HAVE PROBLEMS THE MORNING OF SURGERY [phone]  REMEMBER:ONLY 1 PER PERSON MAY GO TO SHORT STAY WITH YOU TO GET READY THE MORNING OF YOUR SURGERY  DO NOT EAT FOOD OR DRINK LIQUIDS AFTER MIDNIGHT  TAKE THESE MEDICINES THE MORNING OF SURGERY: MAY TAKE HYDROCODONE IF NEEDED  STOP ASPIRIN / IBUPROFEN / ALEVE / VITAMINS / HERBAL MEDS __5__ DAYS BEFORE SURGERY  YOU MAY NOT HAVE ANY METAL ON YOUR BODY INCLUDING HAIR PINS AND PIERCING'S. DO NOT WEAR JEWELRY, MAKEUP, LOTIONS, POWDERS OR PERFUMES. DO NOT WEAR NAIL POLISH. DO NOT SHAVE 48 HRS PRIOR TO SURGERY. MEN MAY SHAVE FACE AND NECK.  DO NOT [place]. [facility] IS NOT RESPONSIBLE FOR VALUABLES.  CONTACTS, DENTURES OR PARTIALS MAY NOT BE WORN TO SURGERY. LEAVE SUITCASE IN CAR. CAN BE BROUGHT TO ROOM AFTER SURGERY.  PATIENTS DISCHARGED THE DAY OF SURGERY WILL NOT BE ALLOWED TO DRIVE HOME.  PLEASE READ OVER THE FOLLOWING INSTRUCTION SHEETS _________________________________________________________________________________                                          [facility] - PREPARING FOR SURGERY  Before surgery, you can play an important role.  Because skin is not sterile, your skin needs to be as free of germs as possible.  You can reduce the number of germs on your skin by washing with CHG (chlorahexidine gluconate) soap before surgery.  CHG is an antiseptic cleaner which kills germs and bonds with the skin to continue killing germs even after washing. Please DO NOT use if you have an allergy to CHG or antibacterial soaps.  If your skin becomes reddened/irritated stop using the CHG and inform your nurse when you arrive at Short Stay. Do not shave (including legs and underarms) for at least  48 hours prior to the first CHG shower.  You may shave your face. Please follow these instructions carefully:   1.  Shower with CHG Soap the night before surgery and the  morning of Surgery.   2.  If you choose to wash your hair, wash your hair first as usual with your  normal  Shampoo.   3.  After you shampoo, rinse your hair and body thoroughly to remove the  shampoo.                                         4.  Use CHG as you would any other liquid soap.  You can apply chg directly  to the skin and wash . Gently wash with scrungie or clean wascloth    5.  Apply the CHG Soap to your body ONLY FROM THE NECK DOWN.   Do not use on open                           Wound or open sores. Avoid contact with eyes, ears mouth and genitals (private parts).  Genitals (private parts) with your normal soap.              6.  Wash thoroughly, paying special attention to the area where your surgery  will be performed.   7.  Thoroughly rinse your body with warm water from the neck down.   8.  DO NOT shower/wash with your normal soap after using and rinsing off  the CHG Soap .                9.  Pat yourself dry with a clean towel.             10.  Wear clean night clothes to bed after shower             11.  Place clean sheets on your bed the night of your first shower and do not  sleep with pets.  Day of Surgery : Do not apply any lotions/deodorants the morning of surgery.  Please wear clean clothes to the hospital/surgery center.  FAILURE TO FOLLOW THESE INSTRUCTIONS MAY RESULT IN THE CANCELLATION OF YOUR SURGERY    PATIENT SIGNATURE_________________________________  ______________________________________________________________________

## 2015-06-16 ENCOUNTER — Encounter (HOSPITAL_COMMUNITY)
Admission: RE | Admit: 2015-06-16 | Discharge: 2015-06-16 | Disposition: A | Discharge status: Home / Self Care | Payer: BLUE CROSS/BLUE SHIELD | Source: Ambulatory Visit | Attending: Surgery | Admitting: Surgery

## 2015-06-16 ENCOUNTER — Encounter (HOSPITAL_COMMUNITY): Payer: Self-pay

## 2015-06-16 DIAGNOSIS — K603 Anal fistula: Secondary | ICD-10-CM | POA: Diagnosis not present

## 2015-06-16 DIAGNOSIS — Z01818 Encounter for other preprocedural examination: Secondary | ICD-10-CM | POA: Insufficient documentation

## 2015-06-16 HISTORY — DX: Type 2 diabetes mellitus without complications: E11.9

## 2015-06-16 HISTORY — DX: Gastro-esophageal reflux disease without esophagitis: K21.9

## 2015-06-16 LAB — BASIC METABOLIC PANEL
Anion gap: 7 (ref 5–15)
BUN: 15 mg/dL (ref 6–20)
CO2: 25 mmol/L (ref 22–32)
Calcium: 9.6 mg/dL (ref 8.9–10.3)
Chloride: 107 mmol/L (ref 101–111)
Creatinine, Ser: 0.96 mg/dL (ref 0.61–1.24)
GFR calc Af Amer: >60 mL/min (ref >60)
GFR calc non Af Amer: >60 mL/min (ref >60)
Glucose, Bld: 149 mg/dL — ABNORMAL HIGH (ref 65–99)
Potassium: 4.1 mmol/L (ref 3.5–5.1)
Sodium: 139 mmol/L (ref 135–145)

## 2015-06-16 LAB — CBC
HCT: 40.9 % (ref 39.0–52.0)
Hemoglobin: 13 g/dL (ref 13.0–17.0)
MCH: 25.6 pg — ABNORMAL LOW (ref 26.0–34.0)
MCHC: 31.8 g/dL (ref 30.0–36.0)
MCV: 80.5 fL (ref 78.0–100.0)
Platelets: 242 10*3/uL (ref 150–400)
RBC: 5.08 MIL/uL (ref 4.22–5.81)
RDW: 13.7 % (ref 11.5–15.5)
WBC: 6.3 10*3/uL (ref 4.0–10.5)

## 2015-06-16 NOTE — Progress Notes (Signed)
   06/16/15 1259  OBSTRUCTIVE SLEEP APNEA  Have you ever been diagnosed with sleep apnea through a sleep study? No  Do you snore loudly (loud enough to be heard through closed doors)?  1  Do you often feel tired, fatigued, or sleepy during the daytime? 0  Has anyone observed you stop breathing during your sleep? 0  Do you have, or are you being treated for high blood pressure? 0  BMI more than 35 kg/m2? 1  Age over 29 years old? 0  Neck circumference greater than 40 cm/16 inches? 1  Gender: 1

## 2015-06-28 MED ORDER — CEFAZOLIN SODIUM 10 G IJ SOLR
3.0000 g | INTRAMUSCULAR | Status: AC
Start: 2015-06-29 — End: 2015-06-29
  Administered 2015-06-29: 3 g via INTRAVENOUS
  Filled 2015-06-28 (×2): qty 3000

## 2015-06-29 ENCOUNTER — Ambulatory Visit (HOSPITAL_COMMUNITY): Payer: BLUE CROSS/BLUE SHIELD | Admitting: Anesthesiology

## 2015-06-29 ENCOUNTER — Ambulatory Visit (HOSPITAL_COMMUNITY)
Admission: RE | Admit: 2015-06-29 | Discharge: 2015-06-29 | Disposition: A | Discharge status: Home / Self Care | Payer: BLUE CROSS/BLUE SHIELD | Source: Ambulatory Visit | Attending: Surgery | Admitting: Surgery

## 2015-06-29 ENCOUNTER — Encounter (HOSPITAL_COMMUNITY): Payer: Self-pay | Admitting: *Deleted

## 2015-06-29 ENCOUNTER — Encounter (HOSPITAL_COMMUNITY)
Admission: RE | Disposition: A | Discharge status: Home / Self Care | Payer: Self-pay | Source: Ambulatory Visit | Attending: Surgery

## 2015-06-29 DIAGNOSIS — K642 Third degree hemorrhoids: Secondary | ICD-10-CM | POA: Diagnosis not present

## 2015-06-29 DIAGNOSIS — Z791 Long term (current) use of non-steroidal anti-inflammatories (NSAID): Secondary | ICD-10-CM | POA: Insufficient documentation

## 2015-06-29 DIAGNOSIS — K219 Gastro-esophageal reflux disease without esophagitis: Secondary | ICD-10-CM | POA: Diagnosis not present

## 2015-06-29 DIAGNOSIS — K603 Anal fistula, unspecified: Secondary | ICD-10-CM | POA: Diagnosis present

## 2015-06-29 DIAGNOSIS — Z79891 Long term (current) use of opiate analgesic: Secondary | ICD-10-CM | POA: Diagnosis not present

## 2015-06-29 DIAGNOSIS — E119 Type 2 diabetes mellitus without complications: Secondary | ICD-10-CM | POA: Insufficient documentation

## 2015-06-29 DIAGNOSIS — Z6841 Body Mass Index (BMI) 40.0 and over, adult: Secondary | ICD-10-CM | POA: Insufficient documentation

## 2015-06-29 DIAGNOSIS — Z87891 Personal history of nicotine dependence: Secondary | ICD-10-CM | POA: Insufficient documentation

## 2015-06-29 DIAGNOSIS — Z79899 Other long term (current) drug therapy: Secondary | ICD-10-CM | POA: Insufficient documentation

## 2015-06-29 DIAGNOSIS — K641 Second degree hemorrhoids: Secondary | ICD-10-CM | POA: Insufficient documentation

## 2015-06-29 DIAGNOSIS — K6289 Other specified diseases of anus and rectum: Secondary | ICD-10-CM | POA: Diagnosis present

## 2015-06-29 HISTORY — PX: ANAL FISTULOTOMY: SHX6423

## 2015-06-29 HISTORY — PX: EXAMINATION UNDER ANESTHESIA: SHX1540

## 2015-06-29 LAB — GLUCOSE, CAPILLARY
Glucose-Capillary: 155 mg/dL — ABNORMAL HIGH (ref 65–99)
Glucose-Capillary: 160 mg/dL — ABNORMAL HIGH (ref 65–99)

## 2015-06-29 SURGERY — ANAL FISTULOTOMY
Anesthesia: General | Site: Rectum

## 2015-06-29 MED ORDER — MIDAZOLAM HCL 2 MG/2ML IJ SOLN
INTRAMUSCULAR | Status: AC
  Filled 2015-06-29: qty 4

## 2015-06-29 MED ORDER — HYDROMORPHONE HCL 1 MG/ML IJ SOLN
INTRAMUSCULAR | Status: AC
  Filled 2015-06-29: qty 1

## 2015-06-29 MED ORDER — LIDOCAINE HCL (CARDIAC) 20 MG/ML IV SOLN
INTRAVENOUS | Status: DC | PRN
  Administered 2015-06-29: 50 mg via INTRAVENOUS

## 2015-06-29 MED ORDER — HYDROMORPHONE HCL 1 MG/ML IJ SOLN
0.2500 mg | INTRAMUSCULAR | Status: DC | PRN
  Administered 2015-06-29: 0.5 mg via INTRAVENOUS

## 2015-06-29 MED ORDER — CHLORHEXIDINE GLUCONATE 4 % EX LIQD: 1.0000 "application " | Freq: Once | CUTANEOUS | Status: DC

## 2015-06-29 MED ORDER — PROPOFOL 10 MG/ML IV BOLUS
INTRAVENOUS | Status: AC
  Filled 2015-06-29: qty 20

## 2015-06-29 MED ORDER — HYDROGEN PEROXIDE 3 % EX SOLN
CUTANEOUS | Status: AC
  Filled 2015-06-29: qty 473

## 2015-06-29 MED ORDER — FENTANYL CITRATE (PF) 100 MCG/2ML IJ SOLN
INTRAMUSCULAR | Status: DC | PRN
  Administered 2015-06-29 (×2): 100 ug via INTRAVENOUS

## 2015-06-29 MED ORDER — KETOROLAC TROMETHAMINE 30 MG/ML IJ SOLN
INTRAMUSCULAR | Status: AC
  Filled 2015-06-29: qty 1

## 2015-06-29 MED ORDER — DIBUCAINE 1 % RE OINT
TOPICAL_OINTMENT | RECTAL | Status: DC | PRN
  Administered 2015-06-29: 1 via RECTAL

## 2015-06-29 MED ORDER — ONDANSETRON HCL 4 MG/2ML IJ SOLN: 4.0000 mg | Freq: Once | INTRAMUSCULAR | Status: DC | PRN

## 2015-06-29 MED ORDER — BUPIVACAINE LIPOSOME 1.3 % IJ SUSP
20.0000 mL | INTRAMUSCULAR | Status: DC
  Filled 2015-06-29: qty 20

## 2015-06-29 MED ORDER — BUPIVACAINE-EPINEPHRINE (PF) 0.25% -1:200000 IJ SOLN
INTRAMUSCULAR | Status: AC
  Filled 2015-06-29: qty 30

## 2015-06-29 MED ORDER — SODIUM CHLORIDE 0.9 % IJ SOLN
INTRAMUSCULAR | Status: AC
  Filled 2015-06-29: qty 10

## 2015-06-29 MED ORDER — LIDOCAINE HCL (CARDIAC) 20 MG/ML IV SOLN
INTRAVENOUS | Status: AC
  Filled 2015-06-29: qty 5

## 2015-06-29 MED ORDER — EPHEDRINE SULFATE 50 MG/ML IJ SOLN
INTRAMUSCULAR | Status: DC | PRN
  Administered 2015-06-29 (×2): 10 mg via INTRAVENOUS

## 2015-06-29 MED ORDER — SODIUM CHLORIDE 0.9 % IJ SOLN
INTRAMUSCULAR | Status: AC
  Filled 2015-06-29: qty 50

## 2015-06-29 MED ORDER — NEOSTIGMINE METHYLSULFATE 10 MG/10ML IV SOLN
INTRAVENOUS | Status: DC | PRN
  Administered 2015-06-29: 4 mg via INTRAVENOUS

## 2015-06-29 MED ORDER — GLYCOPYRROLATE 0.2 MG/ML IJ SOLN
INTRAMUSCULAR | Status: AC
  Filled 2015-06-29: qty 3

## 2015-06-29 MED ORDER — BUPIVACAINE-EPINEPHRINE 0.25% -1:200000 IJ SOLN
INTRAMUSCULAR | Status: DC | PRN
  Administered 2015-06-29: 30 mL

## 2015-06-29 MED ORDER — ROCURONIUM BROMIDE 100 MG/10ML IV SOLN
INTRAVENOUS | Status: AC
  Filled 2015-06-29: qty 1

## 2015-06-29 MED ORDER — PROPOFOL 10 MG/ML IV BOLUS
INTRAVENOUS | Status: AC
Start: 2015-06-29 — End: 2015-06-29
  Filled 2015-06-29: qty 20

## 2015-06-29 MED ORDER — PROMETHAZINE HCL 25 MG/ML IJ SOLN
INTRAMUSCULAR | Status: AC
  Filled 2015-06-29: qty 1

## 2015-06-29 MED ORDER — GLYCOPYRROLATE 0.2 MG/ML IJ SOLN
INTRAMUSCULAR | Status: DC | PRN
  Administered 2015-06-29: 0.6 mg via INTRAVENOUS

## 2015-06-29 MED ORDER — METRONIDAZOLE IN NACL 5-0.79 MG/ML-% IV SOLN
500.0000 mg | INTRAVENOUS | Status: AC
  Administered 2015-06-29: 500 mg via INTRAVENOUS

## 2015-06-29 MED ORDER — MIDAZOLAM HCL 5 MG/5ML IJ SOLN
INTRAMUSCULAR | Status: DC | PRN
  Administered 2015-06-29: 2 mg via INTRAVENOUS

## 2015-06-29 MED ORDER — PROMETHAZINE HCL 25 MG/ML IJ SOLN
6.2500 mg | INTRAMUSCULAR | Status: DC | PRN
  Administered 2015-06-29: 12.5 mg via INTRAVENOUS

## 2015-06-29 MED ORDER — SUCCINYLCHOLINE CHLORIDE 20 MG/ML IJ SOLN
INTRAMUSCULAR | Status: DC | PRN
  Administered 2015-06-29: 160 mg via INTRAVENOUS

## 2015-06-29 MED ORDER — METRONIDAZOLE IN NACL 5-0.79 MG/ML-% IV SOLN
INTRAVENOUS | Status: AC
  Filled 2015-06-29: qty 100

## 2015-06-29 MED ORDER — NAPROXEN 500 MG PO TABS: 500.0000 mg | ORAL_TABLET | Freq: Two times a day (BID) | ORAL | Status: DC

## 2015-06-29 MED ORDER — DIBUCAINE 1 % RE OINT
TOPICAL_OINTMENT | RECTAL | Status: AC
  Filled 2015-06-29: qty 28

## 2015-06-29 MED ORDER — ONDANSETRON HCL 4 MG/2ML IJ SOLN
INTRAMUSCULAR | Status: AC
  Filled 2015-06-29: qty 2

## 2015-06-29 MED ORDER — METHYLENE BLUE 1 % INJ SOLN
INTRAMUSCULAR | Status: AC
  Filled 2015-06-29: qty 10

## 2015-06-29 MED ORDER — BUPIVACAINE LIPOSOME 1.3 % IJ SUSP
INTRAMUSCULAR | Status: DC | PRN
  Administered 2015-06-29: 20 mL

## 2015-06-29 MED ORDER — FENTANYL CITRATE (PF) 250 MCG/5ML IJ SOLN
INTRAMUSCULAR | Status: AC
  Filled 2015-06-29: qty 25

## 2015-06-29 MED ORDER — SODIUM CHLORIDE 0.9 % IJ SOLN
INTRAMUSCULAR | Status: DC | PRN
  Administered 2015-06-29: 20 mL

## 2015-06-29 MED ORDER — HYDROCODONE-ACETAMINOPHEN 10-325 MG PO TABS: 1.0000 | ORAL_TABLET | Freq: Four times a day (QID) | ORAL | Status: DC | PRN

## 2015-06-29 MED ORDER — ROCURONIUM BROMIDE 100 MG/10ML IV SOLN
INTRAVENOUS | Status: DC | PRN
  Administered 2015-06-29: 30 mg via INTRAVENOUS

## 2015-06-29 MED ORDER — PROPOFOL 10 MG/ML IV BOLUS
INTRAVENOUS | Status: DC | PRN
  Administered 2015-06-29: 200 mg via INTRAVENOUS

## 2015-06-29 MED ORDER — LACTATED RINGERS IV SOLN
INTRAVENOUS | Status: DC | PRN
  Administered 2015-06-29 (×2): via INTRAVENOUS

## 2015-06-29 MED ORDER — NEOSTIGMINE METHYLSULFATE 10 MG/10ML IV SOLN
INTRAVENOUS | Status: AC
  Filled 2015-06-29: qty 1

## 2015-06-29 SURGICAL SUPPLY — 32 items
BLADE HEX COATED 2.75 (ELECTRODE) ×3 IMPLANT
BLADE SURG 15 STRL LF DISP TIS (BLADE) ×1 IMPLANT
BLADE SURG 15 STRL SS (BLADE) ×2
BRIEF STRETCH FOR OB PAD LRG (UNDERPADS AND DIAPERS) ×3 IMPLANT
COVER SURGICAL LIGHT HANDLE (MISCELLANEOUS) ×3 IMPLANT
DECANTER SPIKE VIAL GLASS SM (MISCELLANEOUS) ×3 IMPLANT
DRAPE LAPAROTOMY T 102X78X121 (DRAPES) ×3 IMPLANT
DRSG PAD ABDOMINAL 8X10 ST (GAUZE/BANDAGES/DRESSINGS) IMPLANT
ELECT PENCIL ROCKER SW 15FT (MISCELLANEOUS) ×3 IMPLANT
ELECT REM PT RETURN 9FT ADLT (ELECTROSURGICAL) ×3
ELECTRODE REM PT RTRN 9FT ADLT (ELECTROSURGICAL) ×1 IMPLANT
GAUZE SPONGE 4X4 12PLY STRL (GAUZE/BANDAGES/DRESSINGS) ×3 IMPLANT
GAUZE SPONGE 4X4 16PLY XRAY LF (GAUZE/BANDAGES/DRESSINGS) ×3 IMPLANT
GLOVE ECLIPSE 8.0 STRL XLNG CF (GLOVE) ×3 IMPLANT
GLOVE INDICATOR 8.0 STRL GRN (GLOVE) ×3 IMPLANT
GOWN STRL REUS W/TWL XL LVL3 (GOWN DISPOSABLE) ×6 IMPLANT
KIT BASIN OR (CUSTOM PROCEDURE TRAY) ×3 IMPLANT
LUBRICANT JELLY K Y 4OZ (MISCELLANEOUS) ×3 IMPLANT
NEEDLE HYPO 22GX1.5 SAFETY (NEEDLE) ×3 IMPLANT
NS IRRIG 1000ML POUR BTL (IV SOLUTION) ×3 IMPLANT
PACK BASIC (CUSTOM PROCEDURE TRAY) ×3 IMPLANT
PACK LITHOTOMY IV (CUSTOM PROCEDURE TRAY) IMPLANT
SUT CHROMIC 2 0 SH (SUTURE) IMPLANT
SUT CHROMIC 3 0 SH 27 (SUTURE) ×6 IMPLANT
SUT PROLENE 2 0 SH DA (SUTURE) IMPLANT
SUT VIC AB 2-0 UR6 27 (SUTURE) ×6 IMPLANT
SUT VIC AB 3-0 SH 27 (SUTURE)
SUT VIC AB 3-0 SH 27XBRD (SUTURE) IMPLANT
SYR 20CC LL (SYRINGE) ×3 IMPLANT
TOWEL OR 17X26 10 PK STRL BLUE (TOWEL DISPOSABLE) ×3 IMPLANT
TOWEL OR NON WOVEN STRL DISP B (DISPOSABLE) ×3 IMPLANT
YANKAUER SUCT BULB TIP 10FT TU (MISCELLANEOUS) ×3 IMPLANT

## 2015-06-29 NOTE — Anesthesia Postprocedure Evaluation (Signed)
  Anesthesia Post-op Note  Patient: Jonathan Taylor  Procedure(s) Performed: Procedure(s) (LRB): EXCISION PERI RECTAL SCAR/FISTULA, INTERNAL HEMMORRHOIDAL LIGATION, PEXY, MARSUPIALIZATION (N/A) EXAM UNDER ANESTHESIA (N/A)  Patient Location: PACU  Anesthesia Type: General  Level of Consciousness: awake and alert   Airway and Oxygen Therapy: Patient Spontanous Breathing  Post-op Pain: mild  Post-op Assessment: Post-op Vital signs reviewed, Patient's Cardiovascular Status Stable, Respiratory Function Stable, Patent Airway and No signs of Nausea or vomiting.  Patient vomited on emergence, but no nausea/vomiting in the PACU.  Last Vitals:  Filed Vitals:   06/29/15 1149  BP: 125/64  Pulse: 70  Temp:   Resp: 16    Post-op Vital Signs: stable   Complications: No apparent anesthesia complications

## 2015-06-29 NOTE — Transfer of Care (Signed)
Immediate Anesthesia Transfer of Care Note  Patient: Jonathan Taylor  Procedure(s) Performed: Procedure(s): EXCISION PERI RECTAL SCAR/FISTULA, INTERNAL HEMMORRHOIDAL LIGATION, PEXY, MARSUPIALIZATION (N/A) EXAM UNDER ANESTHESIA (N/A)  Patient Location: PACU  Anesthesia Type:General  Level of Consciousness: awake, alert  and oriented  Airway & Oxygen Therapy: Patient Spontanous Breathing and Patient connected to face mask oxygen  Post-op Assessment: Report given to RN and Post -op Vital signs reviewed and stable  Post vital signs: Reviewed and stable  Last Vitals:  Filed Vitals:   06/29/15 0613  BP: 146/81  Pulse: 75  Temp: 36.7 C  Resp: 18    Complications: No apparent anesthesia complications

## 2015-06-29 NOTE — Anesthesia Preprocedure Evaluation (Addendum)
Anesthesia Evaluation  Patient identified by MRN, date of birth, ID band Patient awake    Reviewed: Allergy & Precautions, NPO status , Patient's Chart, lab work & pertinent test results  Airway Mallampati: II  TM Distance: >3 FB Neck ROM: Full    Dental  (+) Teeth Intact, Dental Advisory Given   Pulmonary former smoker,  breath sounds clear to auscultation  Pulmonary exam normal       Cardiovascular Exercise Tolerance: Good - anginanegative cardio ROS Normal cardiovascular examRhythm:Regular Rate:Normal     Neuro/Psych negative neurological ROS     GI/Hepatic Neg liver ROS, GERD-  Medicated,  Endo/Other  diabetes, Type 2, Oral Hypoglycemic AgentsMorbid obesity  Renal/GU negative Renal ROS     Musculoskeletal negative musculoskeletal ROS (+)   Abdominal   Peds  Hematology negative hematology ROS (+)   Anesthesia Other Findings Day of surgery medications reviewed with the patient.  Reproductive/Obstetrics                           Anesthesia Physical Anesthesia Plan  ASA: II  Anesthesia Plan: General   Post-op Pain Management:    Induction: Intravenous  Airway Management Planned: Oral ETT  Additional Equipment:   Intra-op Plan:   Post-operative Plan: Extubation in OR  Informed Consent: I have reviewed the patients History and Physical, chart, labs and discussed the procedure including the risks, benefits and alternatives for the proposed anesthesia with the patient or authorized representative who has indicated his/her understanding and acceptance.   Dental advisory given  Plan Discussed with: CRNA  Anesthesia Plan Comments: (Risks/benefits of general anesthesia discussed with patient including risk of damage to teeth, lips, gum, and tongue, nausea/vomiting, allergic reactions to medications, and the possibility of heart attack, stroke and death.  All patient questions  answered.  Patient wishes to proceed.)        Anesthesia Quick Evaluation

## 2015-06-29 NOTE — Anesthesia Procedure Notes (Signed)
Procedure Name: Intubation Date/Time: 06/29/2015 7:41 AM Performed by: Dimas Millin, Dontai Pember F Pre-anesthesia Checklist: Patient identified, Emergency Drugs available, Suction available, Patient being monitored and Timeout performed Patient Re-evaluated:Patient Re-evaluated prior to inductionOxygen Delivery Method: Circle system utilized Preoxygenation: Pre-oxygenation with 100% oxygen Intubation Type: IV induction Ventilation: Mask ventilation without difficulty Laryngoscope Size: Glidescope and 4 Grade View: Grade I Tube type: Oral Tube size: 7.5 mm Number of attempts: 1 Airway Equipment and Method: Stylet Placement Confirmation: ETT inserted through vocal cords under direct vision,  positive ETCO2 and breath sounds checked- equal and bilateral Secured at: 25 cm Tube secured with: Tape Dental Injury: Teeth and Oropharynx as per pre-operative assessment  Comments: Patient intubated without difficulty using glidescope. Intubated by paramedic student, Dionisio David

## 2015-06-29 NOTE — Progress Notes (Signed)
Sitz bath given to patient with instructions

## 2015-06-29 NOTE — Discharge Instructions (Signed)
ANORECTAL SURGERY:  POST OPERATIVE INSTRUCTIONS  1. Take your usually prescribed home medications unless otherwise directed. 2. DIET: Follow a light bland diet the first 24 hours after arrival home, such as soup, liquids, crackers, etc.  Be sure to include lots of fluids daily.  Avoid fast food or heavy meals as your are more likely to get nauseated.  Eat a low fat the next few days after surgery.   3. PAIN CONTROL: a. Pain is best controlled by a usual combination of three different methods TOGETHER: i. Ice/Heat ii. Over the counter pain medication iii. Prescription pain medication b. Most patients will experience some swelling and discomfort in the anus/rectal area. and incisions.  Ice packs or heat (30-60 minutes up to 6 times a day) will help. Use ice for the first few days to help decrease swelling and bruising, then switch to heat such as warm towels, sitz baths, warm baths, etc to help relax tight/sore spots and speed recovery.  Some people prefer to use ice alone, heat alone, alternating between ice & heat.  Experiment to what works for you.  Swelling and bruising can take several weeks to resolve.   c. It is helpful to take an over-the-counter pain medication regularly for the first few weeks.  Choose one of the following that works best for you: i. Naproxen (Aleve, etc)  Two '220mg'$  tabs twice a day ii. Ibuprofen (Advil, etc) Three '200mg'$  tabs four times a day (every meal & bedtime) iii. Acetaminophen (Tylenol, etc) 500-'650mg'$  four times a day (every meal & bedtime) d. A  prescription for pain medication (such as oxycodone, hydrocodone, etc) should be given to you upon discharge.  Take your pain medication as prescribed.  i. If you are having problems/concerns with the prescription medicine (does not control pain, nausea, vomiting, rash, itching, etc), please call us [phone] to see if we need to switch you to a different pain medicine that will work better for you and/or control your  side effect better. ii. If you need a refill on your pain medication, please contact your pharmacy.  They will contact our office to request authorization. Prescriptions will not be filled after 5 pm or on week-ends.  Use a Sitz Bath 4-8 times a day for relief   [employer] A sitz bath is a warm water bath taken in the sitting position that covers only the hips and buttocks. It may be used for either healing or hygiene purposes. Sitz baths are also used to relieve pain, itching, or muscle spasms. The water may contain medicine. Moist heat will help you heal and relax.  HOME CARE INSTRUCTIONS  Take 3 to 4 sitz baths a day.  Fill the bathtub half full with warm water.  Sit in the water and open the drain a little.  Turn on the warm water to keep the tub half full. Keep the water running constantly.  Soak in the water for 15 to 20 minutes.  After the sitz bath, pat the affected area dry first.   4. KEEP YOUR BOWELS REGULAR a. The goal is one bowel movement a day b. Avoid getting constipated.  Between the surgery and the pain medications, it is common to experience some constipation.  Increasing fluid intake and taking a fiber supplement (such as Metamucil, Citrucel, FiberCon, MiraLax, etc) 1-2 times a day regularly will usually help prevent this problem from occurring.  A mild laxative (prune juice, Milk of Magnesia, MiraLax, etc) should be taken according to package  directions if there are no bowel movements after 48 hours. c. Watch out for diarrhea.  If you have many loose bowel movements, simplify your diet to bland foods & liquids for a few days.  Stop any stool softeners and decrease your fiber supplement.  Switching to mild anti-diarrheal medications (Kayopectate, Pepto Bismol) can help.  If this worsens or does not improve, please call us.  5. Wound Care a. Remove your bandages the day after surgery.  Unless discharge instructions indicate otherwise, leave your bandage dry and in place  overnight.  Remove the bandage during your first bowel movement.   b. Allow the wound packing to fall out over the next few days.  You can trim exposed gauze / ribbon as it falls out.  You do not need to repack the wound unless instructed otherwise.  Wear an absorbent pad or soft cotton gauze in your underwear as needed to catch any drainage and help keep the area  c. Keep the area clean and dry.  Bathe / shower every day.  Keep the area clean by showering / bathing over the incision / wound.   It is okay to soak an open wound to help wash it.  Wet wipes or showers / gentle washing after bowel movements is often less traumatic than regular toilet paper. d. Dennis Bast may have some styrofoam-like soft packing in the rectum which will come out with the first bowel movement.  e. You will often notice bleeding with bowel movements.  This should slow down by the end of the first week of surgery f. Expect some drainage.  This should slow down, too, by the end of the first week of surgery.  Wear an absorbent pad or soft cotton gauze in your underwear until the drainage stops. 6. ACTIVITIES as tolerated:   a. You may resume regular (light) daily activities beginning the next day--such as daily self-care, walking, climbing stairs--gradually increasing activities as tolerated.  If you can walk 30 minutes without difficulty, it is safe to try more intense activity such as jogging, treadmill, bicycling, low-impact aerobics, swimming, etc. b. Save the most intensive and strenuous activity for last such as sit-ups, heavy lifting, contact sports, etc  Refrain from any heavy lifting or straining until you are off narcotics for pain control.   c. DO NOT PUSH THROUGH PAIN.  Let pain be your guide: If it hurts to do something, don't do it.  Pain is your body warning you to avoid that activity for another week until the pain goes down. d. You may drive when you are no longer taking prescription pain medication, you can comfortably  sit for long periods of time, and you can safely maneuver your car and apply brakes. e. Dennis Bast may have sexual intercourse when it is comfortable.  7. FOLLOW UP in our office a. Please call [facility] at [phone] to set up an appointment to see your surgeon in the office for a follow-up appointment approximately 2 weeks after your surgery. b. Make sure that you call for this appointment the day you arrive home to insure a convenient appointment time. 10. IF YOU HAVE DISABILITY OR FAMILY LEAVE FORMS, BRING THEM TO THE OFFICE FOR PROCESSING.  DO NOT GIVE THEM TO YOUR DOCTOR.        WHEN TO CALL us [phone]: 1. Poor pain control 2. Reactions / problems with new medications (rash/itching, nausea, etc)  3. Fever over 101.5 F (38.5 C) 4. Inability to urinate 5. Nausea and/or  vomiting 6. Worsening swelling or bruising 7. Continued bleeding from incision. 8. Increased pain, redness, or drainage from the incision  The clinic staff is available to answer your questions during regular business hours (8:30am-5pm).  Please dont hesitate to call and ask to speak to one of our nurses for clinical concerns.   A surgeon from [facility] is always on call at the hospitals   If you have a medical emergency, go to the nearest emergency room or call 911.    [facility], [place], [place], [place], [place]  [address] ? MAIN: [phone] ? TOLL FREE: [phone] ? FAX [phone] [email or web address]  Managing Pain  Pain after surgery or related to activity is often due to strain/injury to muscle, tendon, nerves and/or incisions.  This pain is usually short-term and will improve in a few months.   Many people find it helpful to do the following things TOGETHER to help speed the process of healing and to get back to regular activity more quickly:  1. Avoid heavy physical activity at first a. No lifting greater than 20 pounds at first,  then increase to lifting as tolerated over the next few weeks b. Do not push through the pain.  Listen to your body and avoid positions and maneuvers than reproduce the pain.  Wait a few days before trying something more intense c. Walking is okay as tolerated, but go slowly and stop when getting sore.  If you can walk 30 minutes without stopping or pain, you can try more intense activity (running, jogging, aerobics, cycling, swimming, treadmill, sex, sports, weightlifting, etc ) d. Remember: If it hurts to do it, then dont do it!  2. Take Anti-inflammatory medication a. Choose ONE of the following over-the-counter medications: i.            Acetaminophen 500mg  tabs (Tylenol) 1-2 pills with every meal and just before bedtime (avoid if you have liver problems) ii.            Naproxen 220mg  tabs (ex. Aleve) 1-2 pills twice a day (avoid if you have kidney, stomach, IBD, or bleeding problems) iii. Ibuprofen 200mg  tabs (ex. Advil, Motrin) 3-4 pills with every meal and just before bedtime (avoid if you have kidney, stomach, IBD, or bleeding problems) b. Take with food/snack around the clock for 1-2 weeks i. This helps the muscle and nerve tissues become less irritable and calm down faster  3. Use a Heating pad or Ice/Cold Pack a. 4-6 times a day b. May use warm bath/hottub  or showers  4. Try Gentle Massage and/or Stretching  a. at the area of pain many times a day b. stop if you feel pain - do not overdo it  Try these steps together to help you body heal faster and avoid making things get worse.  Doing just one of these things may not be enough.    If you are not getting better after two weeks or are noticing you are getting worse, contact our office for further advice; we may need to re-evaluate you & see what other things we can do to help.  GETTING TO GOOD BOWEL HEALTH. Irregular bowel habits such as constipation and diarrhea can lead to many problems over time.  Having one soft bowel  movement a day is the most important way to prevent further problems.  The anorectal canal is designed to handle stretching and feces to safely manage our ability to get rid of solid  waste (feces, poop, stool) out of our body.  BUT, hard constipated stools can act like ripping concrete bricks and diarrhea can be a burning fire to this very sensitive area of our body, causing inflamed hemorrhoids, anal fissures, increasing risk is perirectal abscesses, abdominal pain/bloating, an making irritable bowel worse.      The goal: ONE SOFT BOWEL MOVEMENT A DAY!  To have soft, regular bowel movements:   Drink plenty of fluids, consider 4-6 tall glasses of water a day.    Take plenty of fiber.  Fiber is the undigested part of plant food that passes into the colon, acting s natures broom to encourage bowel motility and movement.  Fiber can absorb and hold large amounts of water. This results in a larger, bulkier stool, which is soft and easier to pass. Work gradually over several weeks up to 6 servings a day of fiber (25g a day even more if needed) in the form of: o Vegetables -- Root (potatoes, carrots, turnips), leafy green (lettuce, salad greens, celery, spinach), or cooked high residue (cabbage, broccoli, etc) o Fruit -- Fresh (unpeeled skin & pulp), Dried (prunes, apricots, cherries, etc ),  or stewed ( applesauce)  o Whole grain breads, pasta, etc (whole wheat)  o Bran cereals   Bulking Agents -- This type of water-retaining fiber generally is easily obtained each day by one of the following:  o Psyllium bran -- The psyllium plant is remarkable because its ground seeds can retain so much water. This product is available as Metamucil, Konsyl, Effersyllium, Per Diem Fiber, or the less expensive generic preparation in drug and health food stores. Although labeled a laxative, it really is not a laxative.  o Methylcellulose -- This is another fiber derived from wood which also retains water. It is available as  Citrucel. o Polyethylene Glycol - and artificial fiber commonly called Miralax or Glycolax.  It is helpful for people with gassy or bloated feelings with regular fiber o Flax Seed - a less gassy fiber than psyllium  No reading or other relaxing activity while on the toilet. If bowel movements take longer than 5 minutes, you are too constipated  AVOID CONSTIPATION.  High fiber and water intake usually takes care of this.  Sometimes a laxative is needed to stimulate more frequent bowel movements, but   Laxatives are not a good long-term solution as it can wear the colon out.  They can help jump-start bowels if constipated, but should be relied on constantly without discussing with your doctor o Osmotics (Milk of Magnesia, Fleets phosphosoda, Magnesium citrate, MiraLax, GoLytely) are safer than  o Stimulants (Senokot, Castor Oil, Dulcolax, Ex Lax)    o Avoid taking laxatives for more than 7 days in a row.   IF SEVERELY CONSTIPATED, try a Bowel Retraining Program: o Do not use laxatives.  o Eat a diet high in roughage, such as bran cereals and leafy vegetables.  o Drink six (6) ounces of prune or apricot juice each morning.  o Eat two (2) large servings of stewed fruit each day.  o Take one (1) heaping tablespoon of a psyllium-based bulking agent twice a day. Use sugar-free sweetener when possible to avoid excessive calories.  o Eat a normal breakfast.  o Set aside 15 minutes after breakfast to sit on the toilet, but do not strain to have a bowel movement.  o If you do not have a bowel movement by the third day, use an enema and repeat the above steps.  Controlling diarrhea o Switch to liquids and simpler foods for a few days to avoid stressing your intestines further. o Avoid dairy products (especially milk & ice cream) for a short time.  The intestines often can lose the ability to digest lactose when stressed. o Avoid foods that cause gassiness or bloating.  Typical foods include beans  and other legumes, cabbage, broccoli, and dairy foods.  Every person has some sensitivity to other foods, so listen to our body and avoid those foods that trigger problems for you. o Adding fiber (Citrucel, Metamucil, psyllium, Miralax) gradually can help thicken stools by absorbing excess fluid and retrain the intestines to act more normally.  Slowly increase the dose over a few weeks.  Too much fiber too soon can backfire and cause cramping & bloating. o Probiotics (such as active yogurt, Align, etc) may help repopulate the intestines and colon with normal bacteria and calm down a sensitive digestive tract.  Most studies show it to be of mild help, though, and such products can be costly. o Medicines: - Bismuth subsalicylate (ex. Kayopectate, Pepto Bismol) every 30 minutes for up to 6 doses can help control diarrhea.  Avoid if pregnant. - Loperamide (Immodium) can slow down diarrhea.  Start with two tablets (4mg  total) first and then try one tablet every 6 hours.  Avoid if you are having fevers or severe pain.  If you are not better or start feeling worse, stop all medicines and call your doctor for advice o Call your doctor if you are getting worse or not better.  Sometimes further testing (cultures, endoscopy, X-ray studies, bloodwork, etc) may be needed to help diagnose and treat the cause of the diarrhea.  TROUBLESHOOTING IRREGULAR BOWELS 1) Avoid extremes of bowel movements (no bad constipation/diarrhea) 2) Miralax 17gm mixed in 8oz. water or juice-daily. May use BID as needed.  3) Gas-x,Phazyme, etc. as needed for gas & bloating.  4) Soft,bland diet. No spicy,greasy,fried foods.  5) Prilosec over-the-counter as needed  6) May hold gluten/wheat products from diet to see if symptoms improve.  7)  May try probiotics (Align, Activa, etc) to help calm the bowels down 7) If symptoms become worse call back immediately.  Anal Fistula An anal fistula is an abnormal tunnel that develops between the  bowel and skin near the outside of the anus, where feces comes out. The anus has a number of tiny glands that make lubricating fluid. Sometimes these glands can become plugged and infected. This may lead to the development of a fluid-filled pocket (abscess). An anal fistula often develops after this infection or abscess. It is nearly always caused by a past or current anal abscess.  CAUSES  Though an anal fistula is almost always caused by a past or current anal abscess, other causes can include:  A complication of surgery.  Trauma to the rectal area.  Radiation to the area.  Other medical conditions or diseases, such as:   Chronic inflammatory bowel disease, such as Crohn disease or ulcerative colitis.   Colon or rectal cancer.   Diverticular disease, such as diverticulitis.   A sexually transmitted disease, such as gonorrhea, chlamydia, or syphilis.  An HIV infection or AIDS.  SYMPTOMS   Throbbing or constant pain that may be worse when sitting.   Swelling or irritation around the anus.   Drainage of pus or blood from an opening near the anus.   Pain with bowel movements.  Fever or chills. DIAGNOSIS  Your caregiver will examine the area  to find the openings of the anal fistula and the fistula tract. The external opening of the anal fistula may be seen during a physical examination. Other examinations that may be performed include:   Examination of the rectal area with a gloved hand (digital rectal exam).   Examination with a probe or scope to help locate the internal opening of the fistula.   Injection of a dye into the fistula opening. X-rays can be taken to find the exact location and path of the fistula.   An MRI or ultrasound of the anal area.  Other tests may be performed to find the cause of the anal fistula.   TREATMENT  The most common treatment for an anal fistula is surgery. There are different surgery options depending on where your fistula is  located and how complex the fistula is. Surgical options include:  A fistulotomy. This surgery involves opening up the whole fistula and draining the contents inside to promote healing.  Seton placement. A silk string (seton) is placed into the fistula during a fistulotomy to drain any infection to promote healing.  Advancement flap procedure. Tissue is removed from your rectum or the skin around the anus and is attached to the opening of the fistula.  Bioprosthetic plug. A cone-shaped plug is made from your tissue and is used to block the opening of the fistula. Some anal fistulas do not require surgery. A fibrin glue is a non-surgical option that involves injecting the glue to seal the fistula. You also may be prescribed an antibiotic medicine to treat an infection.  HOME CARE INSTRUCTIONS   Take your antibiotics as directed. Finish them even if you start to feel better.  Only take over-the-counter or prescription medicines as directed by your caregiver.Use a stool softener or laxative, if recommended.   Eat a high-fiber diet to help avoid constipation or as directed by your caregiver.  Drink enough water to keep your urine clear or pale yellow.   A warm sitz bath may be soothing and help with healing. You may take warm sitz baths for 15-20 minutes, 3-4 times a day to ease pain and discomfort.   Follow excellent hygiene to keep the anal area as clean and dry as possible. Use wet toilet paper or moist towelettes after each bowel movement.  SEEK MEDICAL CARE IF: You have increased pain not controlled with medicines.  SEEK IMMEDIATE MEDICAL CARE IF:  You have severe, intolerable pain.  You have new swelling, redness, or discharge around the anal area.  You have tenderness or warmth around the anal area.  You have chills or diarrhea.  You have severe problems urinating or having a bowel movement.   You have a fever or persistent symptoms for more than 2-3 days.   You have  a fever and your symptoms suddenly get worse.  MAKE SURE YOU:   Understand these instructions.  Will watch your condition.  Will get help right away if you are not doing well or get worse. Document Released: 10/05/2008 Document Revised: 10/09/2012 Document Reviewed: 08/28/2011 [facility] Patient Information 2015 [place], [place]. This information is not intended to replace advice given to you by your health care provider. Make sure you discuss any questions you have with your health care provider.

## 2015-06-29 NOTE — Op Note (Signed)
06/29/2015  8:49 AM  PATIENT:  Jonathan Taylor  29 y.o. male  Patient Care Team: Olga Millers, MD as PCP - General (Internal Medicine) Michael Boston, MD as Consulting Physician (General Surgery) Jerene Bears, MD as Consulting Physician (Gastroenterology)  PRE-OPERATIVE DIAGNOSIS:  Perianal Wound, Probable Anal Fistula  POST-OPERATIVE DIAGNOSIS:    Perianal Wound, Probable Anal Fistula Grade 2 & 3 internal hemorrhoids with bleeding  PROCEDURE:  Procedure(s): EXCISION PERI RECTAL SCAR/FISTULA with MARSUPIALIZATION INTERNAL HEMMORRHOIDAL LIGATION & PEXY EXAM UNDER ANESTHESIA  SURGEON:  Surgeon(s): Michael Boston, MD  ASSISTANT: RN   ANESTHESIA:     Local field block Anorectal block General  0.25% bupivacaine with epinephrine at the beginning of the case.  Liposomal bupivacaine (Experel) at the end of the case.  EBL:     Delay start of Pharmacological VTE agent (>24hrs) due to surgical blood loss or risk of bleeding:  no  DRAINS: none   SPECIMEN:  Source of Specimen:  Perirectal scar/fistula  DISPOSITION OF SPECIMEN:  PATHOLOGY  COUNTS:  YES  PLAN OF CARE: Discharge to home after PACU  PATIENT DISPOSITION:  PACU - hemodynamically stable.  INDICATION: Patient with recurrent perirectal abscesses s/p I&D 2011.  Superficial fistulotomy 2012.  Probable recurrent perirectal fistula on exam in office.  I recommended examination and surgical treatment:  The anatomy & physiology of the anorectal region was discussed.  We discussed the pathophysiology of anorectal abscess and fistula.  Differential diagnosis was discussed.  Natural history progression was discussed.   I stressed the importance of a bowel regimen to have daily soft bowel movements to minimize progression of disease.     The patient's condition is not adequately controlled.  Non-operative treatment has not healed the fistula.  Therefore, I recommended examination under anaesthesia to confirm the diagnosis  and treat the fistula.  I discussed techniques that may be required such as fistulotomy, ligation by LIFT technique, and/or seton placement.  Benefits & alternatives discussed.  I noted a good likelihood this will help address the problem, but sometimes repeat operations and prolonged healing times may occur.  Risks such as bleeding, pain, recurrence, reoperation, incontinence, heart attack, death, and other risks were discussed.      Educational handouts further explaining the pathology, treatment options, and bowel regimen were given.  The patient expressed understanding & wishes to proceed.  We will work to coordinate surgery for a mutually convenient time.   OR FINDINGS: Patient had a superficial scar cord concerning for recurrent fistula.    External location Left lateral.    Internal location : Left lateral anal crypt about 1 cm from anal verge.  DESCRIPTION:   Informed consent was confirmed. Patient underwent general anesthesia without difficulty. Patient was placed into prone/jackkninfe positioning.  The perianal region was prepped and draped in sterile fashion. Surgical timeout confirmed or plan.  I did digital rectal examination and then transitioned over to anoscopy to get a sense of the anatomy.  I noted a knotted scarred cord in the left lateral pperirectal region c/w prior scar.  And felt rather superficial and going over the sphincter.  It seemed too narrow to probe.  Because of recurrent abscesses and diabetes, I decided the aggressive and excised the scallop roll fistula component.  The main elliptical excision around the cord in the perirectal region.  He lifted off the superficial cord over the sphincters to the left lateral anal canal were connected to an anal crypt.  Suspicious for a partially healed chronic  fistula.  Specimen sent off.  Patient did have significant internal hemorrhoids.  I went ahead and ligated the left anterior and left posterior hemorrhoid piles using 20  Vicryls suture.  I placed a stitch 5 cm proximal to the anal verge in a figure-of-eight and then ran that to the white line of [place].  I tied that down for suture pexy.  Did that and a left hexagonal pattern (left posterior lateral, left lateral, left anterior.  That help flatten and reduce the hemorrhoids down.  Perianal skin wound edges were still a little folded and heaped up, so I excised some excess skin and dermis to have a broad flat perianal left lateral wound.  I marsupialized the wound from the anal verge more distally using running 3-0 chromic around the edges of the wound.  Hemostasis was excellent.  I reexamined the anal canal.   There was no narrowing.  Hemostasis was excellent. Hemostasis was good.  Delayed topical and metastatic ointment. Patient is being extubated go to recovery room.  I was not able to successfully locate any family in the waiting room.  I discussed postoperative instructions with the patient holding area.  Instructions are written as well.  [name], M.D., F.A.C.S. Gastrointestinal and Minimally Invasive Surgery [facility] [address], [place], [place] [address] [phone] Main / Paging

## 2015-06-29 NOTE — H&P (Signed)
Jonathan Taylor. Jonathan Taylor 03/03/2015 2:24 PM Location: [facility] Patient #: [id number] DOB: 1986-04-19 Single / Language: [name] / Race: Black or African American Male  History of Present Illness [name] MD; 03/03/2015 3:09 PM) Patient words: Anal pain, fistula.  The patient is a 29 year old male who presents with a complaint of fistula. Patient sent for surgical consultation request by his gastroenterologist Dr. [name] for recurrent anal fistula.  Pleasant morbidly obese male that has struggled with perianal pain and infections for the past few years. Had an abscess that required drainage and superficial fistulotomy in Oct 2011. A few months later things seemed to open up again. Discussion about repeat surgery. Tried to hold off through the year. ED visits. Ended up seeing a different surgeon in the group and had superficial fistulotomy 2 in Oct 2012. Eventually healed up. He notes that he did not have problems for 2 years. However a few months ago he started having anal pain and drainage. Foul odor. Concerned him. Saw gastroenterology. Colonoscopy & biopsy showed no evidence of Crohn's or other abnormality. Suspicious internal anal canal opening noted. Very suspicious for recurrent anal fistula. Recommended surgical reevaluation. Patient's mother wished a Museum/gallery conservator. Therefore sent to me.  Patient has occasional sharp anal pains. Trying warm soaks. 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 Gets rather sore at the end of the day. He quit smoking. He wants to get scheduled. He is trying to avoid time off of work but can be painful. He if he can take anything too tight himself over until he can get the money and time blocked off. Hoping for a month or so from now. Has short-term disability set up an case at the longer recovery. He is having a rough day with worse pain and wanted to be seen and checked again. Make sure there is no worse  abscess. Doesn't know if he'll be able to get to work tomorrow.    Problem List/Past Medical [name], MD; 03/03/2015 2:51 PM) ANAL FISTULA (565.1  K60.3)  Diagnostic Studies History [name], MD; 03/03/2015 2:51 PM) Colonoscopy within last year  Allergies Elbert Ewings, CMA; 03/03/2015 2:24 PM) No Known Drug Allergies04/27/2016  Medication History Elbert Ewings, CMA; 03/03/2015 2:24 PM) Ciprofloxacin HCl (500MG  Tablet, Oral) Active. MetroNIDAZOLE (500MG  Tablet, Oral) Active. Meloxicam (7.5MG  Tablet, Oral) Active. Hydrocodone-Acetaminophen (5-325MG  Tablet, Oral as needed) Active. Medications Reconciled  Social History [name], MD; 03/03/2015 2:51 PM) Alcohol use Occasional alcohol use. Caffeine use Tea. Illicit drug use Remotely quit drug use. Tobacco use Current every day smoker.  Family History [name], MD; 03/03/2015 2:51 PM) Arthritis Mother. Diabetes Mellitus Mother. Hypertension Mother.  Review of Systems [name], MD; 03/03/2015 2:51 00) General Present- Weight Gain. Not Present- Appetite Loss, Chills, Fatigue, Fever, Night Sweats and Weight Loss. Skin Not Present- Change in Wart/Mole, Dryness, Hives, Jaundice, New Lesions, Non-Healing Wounds, Rash and Ulcer. Gastrointestinal Present- Rectal Pain. Not Present- Abdominal Pain, Bloating, Bloody Stool, Change in Bowel Habits, Chronic diarrhea, Constipation, Difficulty Swallowing, Excessive gas, Gets full quickly at meals, Hemorrhoids, Indigestion, Nausea and Vomiting.   Vitals Elbert Ewings CMA; 03/03/2015 2:25 PM) 03/03/2015 2:24 PM Weight: 364 lb Height: 76in Body Surface Area: 2.98 m Body Mass Index: 44.31 kg/m Temp.: 99.31F(Oral)  Pulse: 81 (Regular)  Resp.: 18 (Unlabored)  BP: 136/68 (Sitting, Left Arm, Standard)    Physical Exam [name] MD; 03/03/2015 2:57 PM) General Mental Status-Alert. General  Appearance-Not in acute distress, Not  Sickly. Orientation-Oriented X3. Hydration-Well hydrated. Voice-Normal.  Integumentary Global Assessment Normal Exam - Axillae: non-tender, no inflammation or ulceration, no drainage. and Distribution of scalp and body hair is normal. General Characteristics Temperature - normal warmth is noted.  Head and Neck Head-normocephalic, atraumatic with no lesions or palpable masses. Face Global Assessment - atraumatic, no absence of expression. Neck Global Assessment - no abnormal movements, no bruit auscultated on the right, no bruit auscultated on the left, no decreased range of motion, non-tender. Trachea-midline. Thyroid Gland Characteristics - non-tender.  Eye Eyeball - Left-Extraocular movements intact, No Nystagmus. Eyeball - Right-Extraocular movements intact, No Nystagmus. Cornea - Left-No Hazy. Cornea - Right-No Hazy. Sclera/Conjunctiva - Left-No scleral icterus, No Discharge. Sclera/Conjunctiva - Right-No scleral icterus, No Discharge. Pupil - Left-Direct reaction to light normal. Pupil - Right-Direct reaction to light normal.  ENMT Ears Pinna - Left - no drainage observed, no generalized tenderness observed. Right - no drainage observed, no generalized tenderness observed. Nose and Sinuses Nose - no destructive lesion observed. Nares - Left - quiet respiration. Right - quiet respiration. Mouth and Throat Lips - Upper Lip - no fissures observed, no pallor noted. Lower Lip - no fissures observed, no pallor noted. Nasopharynx - no discharge present. Oral Cavity/Oropharynx - Tongue - no dryness observed. Oral Mucosa - no cyanosis observed. Hypopharynx - no evidence of airway distress observed.  Chest and Lung Exam Inspection Movements - Normal and Symmetrical. Accessory muscles - No use of accessory muscles in breathing. Palpation Normal exam - Non-tender. Auscultation Breath sounds - Normal and Clear.  Cardiovascular Auscultation Rhythm -  Regular. Murmurs & Other Heart Sounds - Normal exam - No Murmurs and No Systolic Clicks.  Abdomen Inspection Normal Exam - No Visible peristalsis and No Abnormal pulsations. Umbilicus - No Bleeding, No Urine drainage. Palpation/Percussion Normal exam - Soft, Non Tender, No Rebound tenderness, No Rigidity (guarding) and No Cutaneous hyperesthesia. Note: Obese but soft.    Male Genitourinary Sexual Maturity Tanner 5 - Adult hair pattern and Adult penile size and shape. Note: Normal external male genitalia. Uncircumcised. No hernias. No testicular masses. No warts.   Rectal Note: Large gluteal region with deep crevice. Perianal skin clean. No evidence of external hemorrhoid. No prolapsing internal hemorrhoids. No anal fissure. No pilonidal disease.   Scarring left lateral perianal region with dimpling 1 cm from anal verge. Very sensitive out to 4cm. Superficial cord felt to the sphincter. Obese so unable to to do good digital or anoscopic exam.   Peripheral Vascular Upper Extremity Inspection - Left - No Cyanotic nailbeds, Not Ischemic. Right - No Cyanotic nailbeds, Not Ischemic.  Neurologic Neurologic evaluation reveals -normal attention span and ability to concentrate, able to name objects and repeat phrases. Appropriate fund of knowledge , normal sensation and normal coordination. Mental Status Affect - not angry, not paranoid. Cranial Nerves-Normal Bilaterally. Gait-Normal.  Neuropsychiatric Mental status exam performed with findings of-able to articulate well with normal speech/language, rate, volume and coherence, thought content normal with ability to perform basic computations and apply abstract reasoning and no evidence of hallucinations, delusions, obsessions or homicidal/suicidal ideation.  Musculoskeletal Global Assessment Spine, Ribs and Pelvis - no instability, subluxation or laxity. Right Upper Extremity - no instability, subluxation or  laxity.  Lymphatic Head & Neck General Head & Neck Lymphatics: Bilateral - Description - No Localized lymphadenopathy. Axillary General Axillary Region: Bilateral - Description - No Localized lymphadenopathy. Femoral & Inguinal Generalized Femoral & Inguinal Lymphatics: Left: Right -  Description - No Localized lymphadenopathy. Description - No Localized lymphadenopathy.    Assessment & Plan ANAL FISTULA (565.1  K60.3) Impression: Persistent left lateral perianal pain and probable fistulous disease. No strong evidence of inflammatory bowel disease by biopsy or serology. Suspect intersphincteric component given recurrence and suspicious anal opening. I think he would benefit from examination under anesthesia with possible intersphincteric LIFT repair vs seton if persistent.  I also noted that smokers often have recurrent infections. I'm glad that he has quit smoking.  We'll give him some antibiotics just in case things get worse. Okay to take more off to recover and get back to work. He is trying to get a new primary care doctor to help manage his diabetes.   He is now ready for surgery.   Current Plans  Schedule for Surgery [facility] Consent - Anal Abscess / Fistula (Gary Gabrielsen): discussed with patient and provided information. Pt Education - [facility] Abscess/Fistula (AT) Pt Education - [facility] Rectal Surgery HCI (Sashay Felling): discussed with patient and provided information. Pt Education - [facility] Good Bowel Health (Magdeline Prange) Started Augmentin 875-125MG , 1 (one) Tablet two times daily, #10, 03/03/2015, Ref. x2. DIABETES MELLITUS, STABLE (250.00  E11.9) Current Plans Pt Education - Diabetes Mellitus: Type 2 *: diabetes mellitus Pt Education - Diabetes Mellitus: Type 2 *: metabolic  [name], M.D., F.A.C.S. Gastrointestinal and Minimally Invasive Surgery [facility] [address], [place], [place] [address] [phone] Main / Paging

## 2015-06-29 NOTE — Interval H&P Note (Signed)
History and Physical Interval Note:  06/29/2015 7:32 AM  Jonathan Taylor  has presented today for surgery, with the diagnosis of Perianal Wound, Probable Anal Fistula  The various methods of treatment have been discussed with the patient and family. After consideration of risks, benefits and other options for treatment, the patient has consented to  Procedure(s): REPAIR OF PERIANAL FISTULA (N/A) EXAM UNDER ANESTHESIA (N/A) as a surgical intervention .  The patient's history has been reviewed, patient examined, no change in status, stable for surgery.  I have reviewed the patient's chart and labs.  Questions were answered to the patient's satisfaction.     Jewell Haught C.

## 2015-06-30 ENCOUNTER — Encounter (HOSPITAL_COMMUNITY): Payer: Self-pay | Admitting: Surgery

## 2015-10-07 ENCOUNTER — Encounter: Payer: Self-pay | Admitting: Family

## 2015-10-07 ENCOUNTER — Ambulatory Visit (INDEPENDENT_AMBULATORY_CARE_PROVIDER_SITE_OTHER): Payer: BLUE CROSS/BLUE SHIELD | Admitting: Family

## 2015-10-07 ENCOUNTER — Other Ambulatory Visit (INDEPENDENT_AMBULATORY_CARE_PROVIDER_SITE_OTHER): Payer: BLUE CROSS/BLUE SHIELD

## 2015-10-07 VITALS — BP 132/92 | HR 71 | Temp 98.0°F | Resp 18 | Ht 78.0 in | Wt 361.0 lb

## 2015-10-07 DIAGNOSIS — Z202 Contact with and (suspected) exposure to infections with a predominantly sexual mode of transmission: Secondary | ICD-10-CM

## 2015-10-07 DIAGNOSIS — E119 Type 2 diabetes mellitus without complications: Secondary | ICD-10-CM

## 2015-10-07 DIAGNOSIS — E669 Obesity, unspecified: Secondary | ICD-10-CM | POA: Diagnosis not present

## 2015-10-07 DIAGNOSIS — E1169 Type 2 diabetes mellitus with other specified complication: Secondary | ICD-10-CM

## 2015-10-07 DIAGNOSIS — G479 Sleep disorder, unspecified: Secondary | ICD-10-CM

## 2015-10-07 LAB — HEMOGLOBIN A1C: Hgb A1c MFr Bld: 6.9 % — ABNORMAL HIGH (ref 4.6–6.5)

## 2015-10-07 NOTE — Progress Notes (Signed)
Pre visit review using our clinic review tool, if applicable. No additional management support is needed unless otherwise documented below in the visit note. 

## 2015-10-07 NOTE — Assessment & Plan Note (Signed)
Patient with concern for possible exposure to STDs. Obtain RPR, HSV 1, HSV-2, HIV, hepatitis C, and GC/chlamydia urine probe. Follow-up pending results.

## 2015-10-07 NOTE — Progress Notes (Signed)
Subjective:    Patient ID: Jonathan Taylor, male    DOB: 23-Aug-1986, 29 y.o.   MRN: [id number]  Chief Complaint  Patient presents with  . Not feeling well    wants diabetes and std checked, having insomnia    HPI:  Jonathan Taylor is a 29 y.o. male who  has a past medical history of History of anal fissures; Blood in stool; Diabetes mellitus without complication ([place]); and GERD (gastroesophageal reflux disease). and presents today for an office visit.   1.) Diabetes - Currently maintained metformin. He has not taken the metformin in the past 2 months and indicates that he has been working on lifestyle changes in attempts to control his blood sugars. While on the metformin he was having frequent bowel movements and experienced an anal fissure which complicated it. Denies any end organ damage and does not current take his medication at home.   Lab Results  Component Value Date   HGBA1C 6.8* 03/12/2015    2.) Insomnia - Experienced a recent episode of depression and was experiencing insomnia and being up for 24 hours at a time. Modifying factors include hot tea which did not help very much. Reports that benadryl makes him itch. Denies attempts of melatonin. Has tried his girlfriends Xanax which has helped him to sleep.   3.) Potential exposure to STD - Reports that he does use protection and only has one partner. There is no concern for STD in his relationship currently.   Allergies  Allergen Reactions  . Tomato Anaphylaxis  . Mushroom Extract Complex Swelling    Lips     Current Outpatient Prescriptions on File Prior to Visit  Medication Sig Dispense Refill  . albuterol (PROVENTIL HFA;VENTOLIN HFA) 108 (90 BASE) MCG/ACT inhaler Inhale 2 puffs into the lungs every 4 (four) hours as needed for wheezing. 1 Inhaler 0  . HYDROcodone-acetaminophen (NORCO) 10-325 MG per tablet Take 1-2 tablets by mouth every 6 (six) hours as needed for moderate pain or severe pain. 50 tablet 0  . naproxen  (NAPROSYN) 500 MG tablet Take 1 tablet (500 mg total) by mouth 2 (two) times daily with a meal. 40 tablet 1   No current facility-administered medications on file prior to visit.    Past Medical History  Diagnosis Date  . History of anal fissures   . Blood in stool   . Diabetes mellitus without complication ([place])   . GERD (gastroesophageal reflux disease)      Review of Systems  Constitutional: Negative for fever and chills.  Respiratory: Negative for chest tightness and shortness of breath.   Cardiovascular: Negative for chest pain, palpitations and leg swelling.  Endocrine: Negative for polydipsia, polyphagia and polyuria.  Genitourinary: Negative for dysuria, urgency, frequency, hematuria, flank pain and penile swelling.      Objective:    BP 132/92 mmHg  Pulse 71  Temp(Src) 98 F (36.7 C) (Oral)  Resp 18  Ht 6\' 6"  (1.981 m)  Wt 361 lb (163.749 kg)  BMI 41.73 kg/m2  SpO2 97% Nursing note and vital signs reviewed.  Physical Exam  Constitutional: He is oriented to person, place, and time. He appears well-developed and well-nourished. No distress.  Cardiovascular: Normal rate, regular rhythm, normal heart sounds and intact distal pulses.   Pulmonary/Chest: Effort normal and breath sounds normal.  Neurological: He is alert and oriented to person, place, and time.  Skin: Skin is warm and dry.  Psychiatric: He has a normal mood and affect. His behavior is  normal. Judgment and thought content normal.       Assessment & Plan:   Problem List Items Addressed This Visit      Endocrine   Diabetes mellitus type 2 in obese ([place]) - Primary    Type 2 diabetes with attempted lifestyle management as patient discontinued taking metformin. Obtain A1c to check current status. Not currently maintained on ACE/ARB or statin medication for CAD risk reduction. Follow up pending A1c results.       Relevant Orders   Hemoglobin A1c     Other   Sleep disturbance    Symptoms consistent  with sleep pattern disturbance. Discussed importance of good sleep hygiene. Recommend trial of over-the-counter medications. Follow-up if symptoms worsen or do not improve.      Possible exposure to STD   Relevant Orders   GC/chlamydia probe amp, urine   HSV 1 antibody, IgG   HSV 2 antibody, IgG   RPR   HIV antibody   Hepatitis C antibody

## 2015-10-07 NOTE — Assessment & Plan Note (Signed)
Symptoms consistent with sleep pattern disturbance. Discussed importance of good sleep hygiene. Recommend trial of over-the-counter medications. Follow-up if symptoms worsen or do not improve.

## 2015-10-07 NOTE — Patient Instructions (Addendum)
Thank you for choosing [facility].  Summary/Instructions:  Your prescription(s) have been submitted to your pharmacy or been printed and provided for you. Please take as directed and contact our office if you believe you are having problem(s) with the medication(s) or have any questions.  Please stop by the lab on the basement level of the building for your blood work. Your results will be released to [place] (or called to you) after review, usually within 72 hours after test completion. If any changes need to be made, you will be notified at that same time.  If your symptoms worsen or fail to improve, please contact our office for further instruction, or in case of emergency go directly to the emergency room at the closest medical facility.    Please start over the counter Unisom or Melatonin. If these do not work please let us know.    Recommendations for improving sleep:   Avoid having pets sleep in the bedroom  Avoid caffeine consumption after 4pm  Keep bedroom cool and conducive to sleep  Avoid nicotine use, especially in the evening  Avoid exercise within 2-3 hours before bedtime  Stimulus Control:   Go to bed only when sleepy  Use the bedroom for sleep and sex only  Go to another room if you are unable to fall asleep within 15 to 20 minutes  Read or engage in other quiet activities and return to bed only when sleepy.  The problem of recurrent insomnia is discussed. Avoidance of caffeine sources is strongly encouraged. Sleep hygiene issues are reviewed. The use of sedative hypnotics for temporary relief is appropriate; we discussed the addictive nature of these drugs, and a one-time only prescription for prn use of a hypnotic is given, to use no more than 3 times per week for 2-3 weeks.

## 2015-10-07 NOTE — Assessment & Plan Note (Signed)
Type 2 diabetes with attempted lifestyle management as patient discontinued taking metformin. Obtain A1c to check current status. Not currently maintained on ACE/ARB or statin medication for CAD risk reduction. Follow up pending A1c results.

## 2015-10-08 ENCOUNTER — Encounter: Payer: Self-pay | Admitting: Family

## 2015-10-08 LAB — HSV 1 ANTIBODY, IGG: HSV 1 Glycoprotein G Ab, IgG: 0.3 IV

## 2015-10-08 LAB — HEPATITIS C ANTIBODY: HCV Ab: NEGATIVE

## 2015-10-08 LAB — GC/CHLAMYDIA PROBE AMP, URINE
Chlamydia, Swab/Urine, PCR: NEGATIVE
GC Probe Amp, Urine: NEGATIVE

## 2015-10-08 LAB — HSV 2 ANTIBODY, IGG: HSV 2 Glycoprotein G Ab, IgG: <0.1 IV

## 2015-10-08 LAB — RPR

## 2015-10-11 ENCOUNTER — Telehealth: Payer: Self-pay | Admitting: Internal Medicine

## 2015-10-11 LAB — HIV ANTIBODY (ROUTINE TESTING W REFLEX): HIV 1&2 Ab, 4th Generation: NONREACTIVE

## 2015-10-11 NOTE — Telephone Encounter (Signed)
Please call patient regarding lab results °

## 2015-10-11 NOTE — Telephone Encounter (Signed)
LVM for pt to call back.

## 2015-10-12 NOTE — Telephone Encounter (Signed)
Spoke with patient and discussed lab results.  

## 2015-11-24 ENCOUNTER — Encounter (HOSPITAL_COMMUNITY): Payer: Self-pay | Admitting: Emergency Medicine

## 2015-11-24 ENCOUNTER — Other Ambulatory Visit (HOSPITAL_COMMUNITY)
Admission: RE | Admit: 2015-11-24 | Discharge: 2015-11-24 | Disposition: A | Discharge status: Home / Self Care | Payer: BLUE CROSS/BLUE SHIELD | Source: Ambulatory Visit | Attending: Emergency Medicine | Admitting: Emergency Medicine

## 2015-11-24 ENCOUNTER — Emergency Department (HOSPITAL_COMMUNITY)
Admission: EM | Admit: 2015-11-24 | Discharge: 2015-11-24 | Disposition: A | Discharge status: Home / Self Care | Payer: BLUE CROSS/BLUE SHIELD | Source: Home / Self Care | Attending: Emergency Medicine | Admitting: Emergency Medicine

## 2015-11-24 DIAGNOSIS — Z113 Encounter for screening for infections with a predominantly sexual mode of transmission: Secondary | ICD-10-CM | POA: Diagnosis not present

## 2015-11-24 DIAGNOSIS — R109 Unspecified abdominal pain: Secondary | ICD-10-CM | POA: Diagnosis not present

## 2015-11-24 NOTE — ED Provider Notes (Signed)
CSN: [id number]     Arrival date & time 11/24/15  1715 History   First MD Initiated Contact with Patient 11/24/15 1755     Chief Complaint  Patient presents with  . Abdominal Pain   (Consider location/radiation/quality/duration/timing/severity/associated sxs/prior Treatment) HPI  He is a 30 year old man here for evaluation of abdominal pain. He states for the last 4 days he has had an intermittent throbbing type pain in his lower abdomen. Initially, it was in the right lower quadrant, but now it moves back and forth between the right lower quadrant and left lower quadrant. He does report some mild constipation, but no diarrhea. No nausea or vomiting. No fevers or chills.  He would also like STD testing as the condom came off during a sexual encounter several weeks ago. He denies any genital lesions, dysuria, or penile discharge.  Past Medical History  Diagnosis Date  . History of anal fissures   . Blood in stool   . Diabetes mellitus without complication ([place])   . GERD (gastroesophageal reflux disease)    Past Surgical History  Procedure Laterality Date  . Rectal surgery  08/2010    Dr Zenia Resides  . Anal fistulectomy  2012    with sphhincterotomy  . Anal fistulotomy N/A 06/29/2015    Procedure: EXCISION PERI RECTAL SCAR/FISTULA, INTERNAL HEMMORRHOIDAL LIGATION, PEXY, MARSUPIALIZATION;  Surgeon: Michael Boston, MD;  Location: [facility];  Service: General;  Laterality: N/A;  . Examination under anesthesia N/A 06/29/2015    Procedure: Jasmine December UNDER ANESTHESIA;  Surgeon: Michael Boston, MD;  Location: [facility];  Service: General;  Laterality: N/A;   Family History  Problem Relation Age of Onset  . Colon cancer Neg Hx   . Colon polyps Neg Hx   . Diabetes Mother   . Throat cancer Maternal Aunt   . Kidney disease Neg Hx   . Hypertension Mother    Social History  Substance Use Topics  . Smoking status: Former Smoker -- 0.50 packs/day    Types: Cigarettes    Quit date: 05/26/2015  . Smokeless  tobacco: Never Used     Comment: Pt given handout  . Alcohol Use: Yes     Comment: Occassionally    Review of Systems As in history of present illness Allergies  Tomato and Mushroom extract complex  Home Medications   Prior to Admission medications   Medication Sig Start Date End Date Taking? Authorizing Provider  albuterol (PROVENTIL HFA;VENTOLIN HFA) 108 (90 BASE) MCG/ACT inhaler Inhale 2 puffs into the lungs every 4 (four) hours as needed for wheezing. 12/07/14   Irene Pap, NP  HYDROcodone-acetaminophen (NORCO) 10-325 MG per tablet Take 1-2 tablets by mouth every 6 (six) hours as needed for moderate pain or severe pain. 06/29/15   Michael Boston, MD  naproxen (NAPROSYN) 500 MG tablet Take 1 tablet (500 mg total) by mouth 2 (two) times daily with a meal. 06/29/15   Michael Boston, MD   Meds Ordered and Administered this Visit  Medications - No data to display  BP 150/90 mmHg  Pulse 91  Temp(Src) 98.6 F (37 C) (Oral)  SpO2 100% No data found.   Physical Exam  Constitutional: He is oriented to person, place, and time. He appears well-developed and well-nourished. No distress.  Obese  Cardiovascular: Normal rate.   Pulmonary/Chest: Effort normal.  Abdominal: Soft. Bowel sounds are normal. He exhibits no distension. There is no tenderness. There is no rebound and no guarding. Hernia confirmed negative in the right inguinal area  and confirmed negative in the left inguinal area.    Areas of intermittent tenderness marked.  Neurological: He is alert and oriented to person, place, and time.    ED Course  Procedures (including critical care time)  Labs Review Labs Reviewed  URINE CYTOLOGY ANCILLARY ONLY    Imaging Review No results found.    MDM   1. Abdominal wall pain    Suspect he may have some weakening of the abdominal wall muscles in these areas given his weight and the heavy lifting he does at work. No obvious hernia at this time. No signs or symptoms  consistent with appendicitis or acute intra-abdominal process. Recommended weight loss, good body mechanics, and Tylenol or ibuprofen as needed. We did collect urine for cytology. Call with any positive results. Follow-up as needed.    Melony Overly, MD 11/24/15 [phone]

## 2015-11-24 NOTE — Discharge Instructions (Signed)
I do not see any sign of appendicitis or intra-abdominal process. I think your pain is coming from your abdominal wall muscles.  I do not see any sign of a hernia at this time, but I am concerned you may develop one over time. You can take Tylenol or ibuprofen as needed for the discomfort. Make sure you are using good body mechanics at work. Losing some weight will also help. We sent your urine for testing. We will call you if anything comes back positive. Please come back if your pain changes or is getting worse.

## 2015-11-24 NOTE — ED Notes (Signed)
The patient presented to the [facility] with a complaint of abdominal pain x 4 days.

## 2015-11-25 ENCOUNTER — Ambulatory Visit: Payer: BLUE CROSS/BLUE SHIELD | Admitting: Internal Medicine

## 2015-11-25 LAB — URINE CYTOLOGY ANCILLARY ONLY
Chlamydia: NEGATIVE
Neisseria Gonorrhea: NEGATIVE
Trichomonas: NEGATIVE

## 2015-12-02 ENCOUNTER — Ambulatory Visit (INDEPENDENT_AMBULATORY_CARE_PROVIDER_SITE_OTHER): Payer: BLUE CROSS/BLUE SHIELD | Admitting: Internal Medicine

## 2015-12-02 ENCOUNTER — Other Ambulatory Visit: Payer: BLUE CROSS/BLUE SHIELD

## 2015-12-02 ENCOUNTER — Encounter: Payer: Self-pay | Admitting: Internal Medicine

## 2015-12-02 VITALS — BP 120/80 | HR 65 | Temp 98.6°F | Resp 12 | Ht 78.5 in | Wt 347.0 lb

## 2015-12-02 DIAGNOSIS — Z202 Contact with and (suspected) exposure to infections with a predominantly sexual mode of transmission: Secondary | ICD-10-CM | POA: Diagnosis not present

## 2015-12-02 DIAGNOSIS — E119 Type 2 diabetes mellitus without complications: Secondary | ICD-10-CM | POA: Diagnosis not present

## 2015-12-02 DIAGNOSIS — E1169 Type 2 diabetes mellitus with other specified complication: Secondary | ICD-10-CM

## 2015-12-02 DIAGNOSIS — E669 Obesity, unspecified: Secondary | ICD-10-CM

## 2015-12-02 DIAGNOSIS — F410 Panic disorder [episodic paroxysmal anxiety] without agoraphobia: Secondary | ICD-10-CM

## 2015-12-02 DIAGNOSIS — G479 Sleep disorder, unspecified: Secondary | ICD-10-CM | POA: Diagnosis not present

## 2015-12-02 MED ORDER — BUSPIRONE HCL 10 MG PO TABS: 10.0000 mg | ORAL_TABLET | Freq: Three times a day (TID) | ORAL | Status: DC

## 2015-12-02 MED ORDER — SUVOREXANT 15 MG PO TABS: 15.0000 mg | ORAL_TABLET | Freq: Every day | ORAL | Status: DC

## 2015-12-02 NOTE — Assessment & Plan Note (Signed)
Rx for buspar and we will see if improving his sleep will lessen these episodes. Also counseled on other ways to reduce stress in his life.

## 2015-12-02 NOTE — Assessment & Plan Note (Signed)
Weight is down in the last month due to diet changes and encouraged to continue. Reminded to stay away from sodas and sugary beverages.

## 2015-12-02 NOTE — Patient Instructions (Signed)
The diabetes is still doing well so keep up the great work with the eating!  We have given you a prescription and coupon for the sleeping medicine to try at bedtime. We recommend you try for a week to get it in your system.   We have sent in buspar for anxiety that you can take up to 3 times a day as needed for panic attacks.   We will check the labs today as well.   Stress and Stress Management Stress is a normal reaction to life events. It is what you feel when life demands more than you are used to or more than you can handle. Some stress can be useful. For example, the stress reaction can help you catch the last bus of the day, study for a test, or meet a deadline at work. But stress that occurs too often or for too long can cause problems. It can affect your emotional health and interfere with relationships and normal daily activities. Too much stress can weaken your immune system and increase your risk for physical illness. If you already have a medical problem, stress can make it worse. CAUSES  All sorts of life events may cause stress. An event that causes stress for one person may not be stressful for another person. Major life events commonly cause stress. These may be positive or negative. Examples include losing your job, moving into a new home, getting married, having a baby, or losing a loved one. Less obvious life events may also cause stress, especially if they occur day after day or in combination. Examples include working long hours, driving in traffic, caring for children, being in debt, or being in a difficult relationship. SIGNS AND SYMPTOMS Stress may cause emotional symptoms including, the following:  Anxiety. This is feeling worried, afraid, on edge, overwhelmed, or out of control.  Anger. This is feeling irritated or impatient.  Depression. This is feeling sad, down, helpless, or guilty.  Difficulty focusing, remembering, or making decisions. Stress may cause physical  symptoms, including the following:   Aches and pains. These may affect your head, neck, back, stomach, or other areas of your body.  Tight muscles or clenched jaw.  Low energy or trouble sleeping. Stress may cause unhealthy behaviors, including the following:   Eating to feel better (overeating) or skipping meals.  Sleeping too little, too much, or both.  Working too much or putting off tasks (procrastination).  Smoking, drinking alcohol, or using drugs to feel better. DIAGNOSIS  Stress is diagnosed through an assessment by your health care provider. Your health care provider will ask questions about your symptoms and any stressful life events.Your health care provider will also ask about your medical history and may order blood tests or other tests. Certain medical conditions and medicine can cause physical symptoms similar to stress. Mental illness can cause emotional symptoms and unhealthy behaviors similar to stress. Your health care provider may refer you to a mental health professional for further evaluation.  TREATMENT  Stress management is the recommended treatment for stress.The goals of stress management are reducing stressful life events and coping with stress in healthy ways.  Techniques for reducing stressful life events include the following:  Stress identification. Self-monitor for stress and identify what causes stress for you. These skills may help you to avoid some stressful events.  Time management. Set your priorities, keep a calendar of events, and learn to say "no." These tools can help you avoid making too many commitments. Techniques for  coping with stress include the following:  Rethinking the problem. Try to think realistically about stressful events rather than ignoring them or overreacting. Try to find the positives in a stressful situation rather than focusing on the negatives.  Exercise. Physical exercise can release both physical and emotional tension.  The key is to find a form of exercise you enjoy and do it regularly.  Relaxation techniques. These relax the body and mind. Examples include yoga, meditation, tai chi, biofeedback, deep breathing, progressive muscle relaxation, listening to music, being out in nature, journaling, and other hobbies. Again, the key is to find one or more that you enjoy and can do regularly.  Healthy lifestyle. Eat a balanced diet, get plenty of sleep, and do not smoke. Avoid using alcohol or drugs to relax.  Strong support network. Spend time with family, friends, or other people you enjoy being around.Express your feelings and talk things over with someone you trust. Counseling or talktherapy with a mental health professional may be helpful if you are having difficulty managing stress on your own. Medicine is typically not recommended for the treatment of stress.Talk to your health care provider if you think you need medicine for symptoms of stress. HOME CARE INSTRUCTIONS  Keep all follow-up visits as directed by your health care provider.  Take all medicines as directed by your health care provider. SEEK MEDICAL CARE IF:  Your symptoms get worse or you start having new symptoms.  You feel overwhelmed by your problems and can no longer manage them on your own. SEEK IMMEDIATE MEDICAL CARE IF:  You feel like hurting yourself or someone else.   This information is not intended to replace advice given to you by your health care provider. Make sure you discuss any questions you have with your health care provider.   Document Released: 04/18/2001 Document Revised: 11/13/2014 Document Reviewed: 06/17/2013 Elsevier Interactive Patient Education [employer].

## 2015-12-02 NOTE — Progress Notes (Signed)
   Subjective:    Patient ID: Jonathan Taylor, male    DOB: 04-29-1986, 30 y.o.   MRN: [id number]  HPI The patient is a 30 YO man coming in for several concerns. He is worried about his sugars as he was misinformed that his last HgA1c was 8.6 (actually 6.9). He is working on diet and exercise and has lost 13 pounds since last visit. He denies high sugars, extreme thirst or urination.  His next concern is for STD screening as he has had unprotected sex since last visit and wishes to be screened. No discharge, burning, lesion, sores. Still having fertility issues and was unable to follow the advice of the urologist due to cost.  Next concern is his anxiety and sleeping. He is not able to sleep well sometimes and this worsens his mood. He feels that this leads to his panic attacks. He has tried someone else's xanax which helped him. Wants to see if he could get some of that.   Review of Systems  Constitutional: Negative for fever, chills, activity change, appetite change, fatigue and unexpected weight change.  HENT: Negative.   Respiratory: Negative for cough, chest tightness, shortness of breath and wheezing.   Cardiovascular: Negative for chest pain and palpitations.  Gastrointestinal: Negative for diarrhea, constipation, blood in stool and abdominal distention.  Musculoskeletal: Negative.   Skin: Negative.   Neurological: Negative.   Psychiatric/Behavioral: Negative.       Objective:   Physical Exam  Constitutional: He is oriented to person, place, and time. He appears well-developed and well-nourished.  Morbidly obese  HENT:  Head: Normocephalic and atraumatic.  Eyes: EOM are normal.  Neck: Normal range of motion.  Cardiovascular: Normal rate and regular rhythm.   Pulmonary/Chest: Effort normal and breath sounds normal. No respiratory distress. He has no wheezes. He has no rales.  Abdominal: Soft. Bowel sounds are normal. He exhibits no distension. There is no tenderness. There is no  rebound.  Musculoskeletal: He exhibits no edema.  Neurological: He is alert and oriented to person, place, and time. Coordination normal.  Skin: Skin is warm and dry.  Psychiatric: He has a normal mood and affect. His behavior is normal.   Filed Vitals:   12/02/15 1055  BP: 120/80  Pulse: 65  Temp: 98.6 F (37 C)  TempSrc: Oral  Resp: 12  Height: 6' 6.5" (1.994 m)  Weight: 347 lb (157.398 kg)  SpO2: 98%      Assessment & Plan:

## 2015-12-02 NOTE — Assessment & Plan Note (Signed)
HgA1c at goal on diet and exercise alone. Recheck in 3-6 months and encouraged to continue with weight loss and exercise.

## 2015-12-02 NOTE — Assessment & Plan Note (Signed)
Rx for belsomra as needed for sleep. Likely his sleep hygiene is poor. Unclear is some OSA could be related to his poor sleep. If no help with belsomra will suggest sleep study.

## 2015-12-02 NOTE — Progress Notes (Signed)
Pre visit review using our clinic review tool, if applicable. No additional management support is needed unless otherwise documented below in the visit note. 

## 2015-12-02 NOTE — Assessment & Plan Note (Signed)
Checking HIV, GC/chlamydia. Recent screen negative and advised to always use protection.

## 2015-12-03 ENCOUNTER — Other Ambulatory Visit: Payer: BLUE CROSS/BLUE SHIELD

## 2015-12-03 DIAGNOSIS — Z202 Contact with and (suspected) exposure to infections with a predominantly sexual mode of transmission: Secondary | ICD-10-CM

## 2015-12-03 LAB — HIV ANTIBODY (ROUTINE TESTING W REFLEX): HIV 1&2 Ab, 4th Generation: NONREACTIVE

## 2015-12-03 LAB — GC/CHLAMYDIA PROBE AMP

## 2015-12-27 ENCOUNTER — Other Ambulatory Visit: Payer: BLUE CROSS/BLUE SHIELD

## 2015-12-27 ENCOUNTER — Other Ambulatory Visit: Payer: Self-pay | Admitting: Internal Medicine

## 2015-12-27 DIAGNOSIS — Z202 Contact with and (suspected) exposure to infections with a predominantly sexual mode of transmission: Secondary | ICD-10-CM

## 2015-12-28 LAB — GC/CHLAMYDIA PROBE AMP
CT Probe RNA: NOT DETECTED
GC Probe RNA: NOT DETECTED

## 2016-04-27 ENCOUNTER — Ambulatory Visit (INDEPENDENT_AMBULATORY_CARE_PROVIDER_SITE_OTHER): Payer: BLUE CROSS/BLUE SHIELD | Admitting: Internal Medicine

## 2016-04-27 ENCOUNTER — Other Ambulatory Visit (INDEPENDENT_AMBULATORY_CARE_PROVIDER_SITE_OTHER): Payer: BLUE CROSS/BLUE SHIELD

## 2016-04-27 ENCOUNTER — Encounter: Payer: Self-pay | Admitting: Internal Medicine

## 2016-04-27 VITALS — BP 132/100 | HR 89 | Temp 99.1°F | Resp 18 | Ht 78.5 in | Wt 377.0 lb

## 2016-04-27 DIAGNOSIS — E119 Type 2 diabetes mellitus without complications: Secondary | ICD-10-CM

## 2016-04-27 DIAGNOSIS — Z202 Contact with and (suspected) exposure to infections with a predominantly sexual mode of transmission: Secondary | ICD-10-CM

## 2016-04-27 DIAGNOSIS — E669 Obesity, unspecified: Secondary | ICD-10-CM

## 2016-04-27 DIAGNOSIS — E1169 Type 2 diabetes mellitus with other specified complication: Secondary | ICD-10-CM

## 2016-04-27 LAB — LIPID PANEL
Cholesterol: 141 mg/dL (ref 0–200)
HDL: 43 mg/dL (ref >39.00)
LDL Cholesterol: 84 mg/dL (ref 0–99)
NonHDL: 98.27
Total CHOL/HDL Ratio: 3
Triglycerides: 69 mg/dL (ref 0.0–149.0)
VLDL: 13.8 mg/dL (ref 0.0–40.0)

## 2016-04-27 LAB — HEMOGLOBIN A1C: Hgb A1c MFr Bld: 6.9 % — ABNORMAL HIGH (ref 4.6–6.5)

## 2016-04-27 LAB — MICROALBUMIN / CREATININE URINE RATIO
Creatinine,U: 330.9 mg/dL
Microalb Creat Ratio: 0.7 mg/g (ref 0.0–30.0)
Microalb, Ur: 2.2 mg/dL — ABNORMAL HIGH (ref 0.0–1.9)

## 2016-04-27 MED ORDER — DULAGLUTIDE 1.5 MG/0.5ML ~~LOC~~ SOAJ: 1.5000 mg | SUBCUTANEOUS | Status: DC

## 2016-04-27 NOTE — Patient Instructions (Addendum)
We have given you the savings card for the trulicity which is the once a week shot we talked about that helps with weight loss.   We are checking the blood and urine today and will send the results on mychart.   I would be happy to help you get a [employer] membership.

## 2016-04-27 NOTE — Progress Notes (Signed)
Pre visit review using our clinic review tool, if applicable. No additional management support is needed unless otherwise documented below in the visit note. 

## 2016-04-28 LAB — HIV ANTIBODY (ROUTINE TESTING W REFLEX): HIV 1&2 Ab, 4th Generation: NONREACTIVE

## 2016-04-28 LAB — GC/CHLAMYDIA PROBE AMP
CT Probe RNA: NOT DETECTED
GC Probe RNA: NOT DETECTED

## 2016-04-28 NOTE — Assessment & Plan Note (Signed)
Checking again for STDs per his request.

## 2016-04-28 NOTE — Progress Notes (Signed)
   Subjective:    Patient ID: Jonathan Taylor, male    DOB: December 02, 1985, 30 y.o.   MRN: [id number]  HPI The patient is a 30 YO man coming in for follow up of his diabetes and his obesity. He is up about 30-40 pounds since last visit which is very distressing to him. He does not exercise or go to the gym although he is interested in going to the [employer] but cannot afford it. His insurance will pay for another place but he does not want to go. He is eating about the same. No new concerns. Wants STD screening again as well.   Review of Systems  Constitutional: Positive for activity change, appetite change and unexpected weight change. Negative for fever, chills and fatigue.  Respiratory: Negative for cough, chest tightness, shortness of breath and wheezing.   Cardiovascular: Negative for chest pain and palpitations.  Gastrointestinal: Negative for diarrhea, constipation, blood in stool and abdominal distention.  Musculoskeletal: Negative.   Skin: Negative.   Neurological: Negative.       Objective:   Physical Exam  Constitutional: He is oriented to person, place, and time. He appears well-developed and well-nourished.  Morbidly obese  HENT:  Head: Normocephalic and atraumatic.  Eyes: EOM are normal.  Neck: Normal range of motion.  Cardiovascular: Normal rate and regular rhythm.   Pulmonary/Chest: Effort normal and breath sounds normal. No respiratory distress. He has no wheezes. He has no rales.  Abdominal: Soft. Bowel sounds are normal. He exhibits no distension. There is no tenderness. There is no rebound.  Musculoskeletal: He exhibits no edema.  Neurological: He is alert and oriented to person, place, and time. Coordination normal.  Skin: Skin is warm and dry.  Psychiatric: He has a normal mood and affect. His behavior is normal.   Filed Vitals:   04/27/16 1521  BP: 132/100  Pulse: 89  Temp: 99.1 F (37.3 C)  TempSrc: Oral  Resp: 18  Height: 6' 6.5" (1.994 m)  Weight: 377 lb  (171.006 kg)  SpO2: 98%      Assessment & Plan:

## 2016-04-28 NOTE — Assessment & Plan Note (Signed)
Rx for trulicity for weight loss and checking HgA1c today. Suspect worsening control with weight gain. Checking microalbumin to creatinine ratio as well. Not on ACE-I or ARB right now.

## 2016-04-28 NOTE — Assessment & Plan Note (Signed)
Weight is up and he knows that he needs to work on it. Some stressful life events since last visit that have messed him up some.

## 2016-07-16 ENCOUNTER — Encounter (HOSPITAL_COMMUNITY): Payer: Self-pay | Admitting: Emergency Medicine

## 2016-07-16 ENCOUNTER — Ambulatory Visit (HOSPITAL_COMMUNITY)
Admission: EM | Admit: 2016-07-16 | Discharge: 2016-07-16 | Disposition: A | Discharge status: Home / Self Care | Payer: BLUE CROSS/BLUE SHIELD | Attending: Family Medicine | Admitting: Family Medicine

## 2016-07-16 DIAGNOSIS — S9032XA Contusion of left foot, initial encounter: Secondary | ICD-10-CM

## 2016-07-16 NOTE — Discharge Instructions (Signed)
Use ice off and on as needed.  Continue naprosyn.

## 2016-07-16 NOTE — ED Triage Notes (Signed)
The patient presented to the [facility] with a complaint of pain and bruising to the arch of his left foot secondary to stepping on an object yesterday.

## 2016-07-16 NOTE — ED Provider Notes (Signed)
[place]    CSN: [id number] Arrival date & time: 07/16/16  1648  First Provider Contact:  None       History   Chief Complaint Chief Complaint  Patient presents with  . Foot Pain    HPI Jonathan Taylor is a 30 y.o. male.   HPI Patient comes in with complaints with left foot pain.  States that he was in a store trying on pants when he stepped on a plastic object and this jammed into the arch of his foot.  Plantar midfoot pain with weightbearing.  Did not break the skin.  Hx of diabetes. No other issues.  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   Past Medical History:  Diagnosis Date  . Blood in stool   . Diabetes mellitus without complication ([place])   . GERD (gastroesophageal reflux disease)   . History of anal fissures     Patient Active Problem List   Diagnosis Date Noted  . Panic attacks 12/02/2015  . Sleep disturbance 10/07/2015  . Possible exposure to STD 10/07/2015  . Diabetes mellitus type 2 in obese ([place]) 03/14/2015  . Male fertility problem 03/14/2015  . Morbid obesity ([place]) 11/17/2014  . Anal fistula 09/12/2011    Past Surgical History:  Procedure Laterality Date  . ANAL FISTULECTOMY  2012   with sphhincterotomy  . ANAL FISTULOTOMY N/A 06/29/2015   Procedure: EXCISION PERI RECTAL SCAR/FISTULA, INTERNAL HEMMORRHOIDAL LIGATION, PEXY, MARSUPIALIZATION;  Surgeon: Michael Boston, MD;  Location: [facility];  Service: General;  Laterality: N/A;  . EXAMINATION UNDER ANESTHESIA N/A 06/29/2015   Procedure: EXAM UNDER ANESTHESIA;  Surgeon: Michael Boston, MD;  Location: [facility];  Service: General;  Laterality: N/A;  . RECTAL SURGERY  08/2010   Dr Zenia Resides       Home Medications    Prior to Admission medications   Medication Sig Start Date End Date Taking? Authorizing Provider  Dulaglutide (TRULICITY) 1.5 0000000 SOPN Inject 1.5 mg into the skin once a week. 04/27/16  Yes Hoyt Koch,  MD  naproxen (NAPROSYN) 500 MG tablet Take 1 tablet (500 mg total) by mouth 2 (two) times daily with a meal. 06/29/15   Michael Boston, MD    Family History Family History  Problem Relation Age of Onset  . Colon cancer Neg Hx   . Colon polyps Neg Hx   . Diabetes Mother   . Throat cancer Maternal Aunt   . Kidney disease Neg Hx   . Hypertension Mother     Social History Social History  Substance Use Topics  . Smoking status: Former Smoker    Packs/day: 0.50    Types: Cigarettes    Quit date: 05/26/2015  . Smokeless tobacco: Never Used     Comment: Pt given handout  . Alcohol use Yes     Comment: Occassionally     Allergies   Tomato and Mushroom extract complex   Review of Systems Review of Systems  Constitutional: Negative.   HENT: Negative.   Eyes: Negative.   Respiratory: Negative.   Cardiovascular: Negative.   Gastrointestinal: Negative.   Genitourinary: Negative.   Psychiatric/Behavioral: Negative.      Physical Exam Triage Vital Signs ED Triage Vitals  Enc Vitals Group     BP 07/16/16 1905 149/88     Pulse Rate 07/16/16 1905 67     Resp 07/16/16 1905 20     Temp 07/16/16  1905 98.5 F (36.9 C)     Temp Source 07/16/16 1905 Oral     SpO2 07/16/16 1905 98 %     Weight --      Height --      Head Circumference --      Peak Flow --      Pain Score 07/16/16 1906 10     Pain Loc --      Pain Edu? --      Excl. in [place]? --    No data found.   Updated Vital Signs BP 149/88 (BP Location: Right Arm)   Pulse 67   Temp 98.5 F (36.9 C) (Oral)   Resp 20   SpO2 98%   Visual Acuity Right Eye Distance:   Left Eye Distance:   Bilateral Distance:    Right Eye Near:   Left Eye Near:    Bilateral Near:     Physical Exam  Constitutional: He is oriented to person, place, and time. No distress.  HENT:  Head: Atraumatic.  Eyes: EOM are normal. Pupils are equal, round, and reactive to light.  Pulmonary/Chest: Effort normal.  Musculoskeletal: He exhibits  tenderness (mid plantar foot.  slight swelling.  no bruising.  sensation intact.  ).  Neurological: He is alert and oriented to person, place, and time.  Skin: Skin is warm and dry.     UC Treatments / Results  Labs (all labs ordered are listed, but only abnormal results are displayed) Labs Reviewed - No data to display  EKG  EKG Interpretation None       Radiology No results found.  Procedures Procedures (including critical care time)  Medications Ordered in UC Medications - No data to display   Initial Impression / Assessment and Plan / UC Course  I have reviewed the triage vital signs and the nursing notes.  Pertinent labs & imaging results that were available during my care of the patient were reviewed by me and considered in my medical decision making (see chart for details).  Clinical Course      Final Clinical Impressions(s) / UC Diagnoses   Final diagnoses:  Foot contusion, left, initial encounter    New Prescriptions New Prescriptions   No medications on file  advised patient to use ice off and on.  Elevate foot.  Will take out of work x 3 days and patient can follow up with me at [place] in 3 days for recheck and discuss return to work status at that time.  All questions answered.  Continue naproxen as needed.    Lanae Crumbly, PA-C 07/16/16 1958

## 2016-09-04 ENCOUNTER — Telehealth: Payer: Self-pay | Admitting: *Deleted

## 2016-09-04 ENCOUNTER — Telehealth: Payer: Self-pay | Admitting: Internal Medicine

## 2016-09-04 DIAGNOSIS — E1169 Type 2 diabetes mellitus with other specified complication: Secondary | ICD-10-CM

## 2016-09-04 DIAGNOSIS — E669 Obesity, unspecified: Principal | ICD-10-CM

## 2016-09-04 NOTE — Telephone Encounter (Signed)
Rec'd fax pt requesting refill on Metformin. Med is not on med list pls advise...Jonathan Taylor

## 2016-09-04 NOTE — Telephone Encounter (Signed)
Patient is looking for a referral to endocrinology per past visits

## 2016-09-04 NOTE — Telephone Encounter (Signed)
I'm not sure what referral he is talking about? Have not seen since June and nothing discussed about that at that time.

## 2016-09-04 NOTE — Telephone Encounter (Signed)
Denied, he should not be taking now.

## 2016-09-04 NOTE — Telephone Encounter (Signed)
Notified pt w/MD response. Pt states he never did take the trulicity. He didn't like the side effects he read about. He states he would rather stay on the metformin...Johny Chess

## 2016-09-05 MED ORDER — METFORMIN HCL 500 MG PO TABS: 500.0000 mg | ORAL_TABLET | Freq: Two times a day (BID) | ORAL | 3 refills | Status: DC

## 2016-09-05 NOTE — Telephone Encounter (Signed)
Left message asking patient to call back so I could discuss what he needs.

## 2016-09-05 NOTE — Telephone Encounter (Signed)
Left message informing patient.

## 2016-09-05 NOTE — Telephone Encounter (Signed)
Sent in metformin. Needs visit December for follow up diabetes.

## 2016-09-05 NOTE — Telephone Encounter (Signed)
Patient called back. I informed him of notes and Patient states he is wanting a referral because he states he wanted to see a endocrinology to deal with his diabetes. But keep Dr. Sharlet Salina as a PCP. That is what his question was. Please follow up with patient. Thank you.

## 2016-09-06 NOTE — Telephone Encounter (Signed)
Referral placed.

## 2016-09-12 ENCOUNTER — Ambulatory Visit (INDEPENDENT_AMBULATORY_CARE_PROVIDER_SITE_OTHER): Payer: BLUE CROSS/BLUE SHIELD | Admitting: Internal Medicine

## 2016-09-12 ENCOUNTER — Other Ambulatory Visit (INDEPENDENT_AMBULATORY_CARE_PROVIDER_SITE_OTHER): Payer: BLUE CROSS/BLUE SHIELD

## 2016-09-12 ENCOUNTER — Encounter: Payer: Self-pay | Admitting: Internal Medicine

## 2016-09-12 VITALS — BP 148/90 | HR 81 | Temp 98.9°F | Resp 16 | Ht 78.5 in | Wt 376.0 lb

## 2016-09-12 DIAGNOSIS — E669 Obesity, unspecified: Secondary | ICD-10-CM | POA: Diagnosis not present

## 2016-09-12 DIAGNOSIS — Z23 Encounter for immunization: Secondary | ICD-10-CM | POA: Diagnosis not present

## 2016-09-12 DIAGNOSIS — E1169 Type 2 diabetes mellitus with other specified complication: Secondary | ICD-10-CM

## 2016-09-12 DIAGNOSIS — Z202 Contact with and (suspected) exposure to infections with a predominantly sexual mode of transmission: Secondary | ICD-10-CM

## 2016-09-12 DIAGNOSIS — E119 Type 2 diabetes mellitus without complications: Secondary | ICD-10-CM

## 2016-09-12 LAB — COMPREHENSIVE METABOLIC PANEL
ALT: 30 U/L (ref 0–53)
AST: 26 U/L (ref 0–37)
Albumin: 3.9 g/dL (ref 3.5–5.2)
Alkaline Phosphatase: 54 U/L (ref 39–117)
BUN: 13 mg/dL (ref 6–23)
CO2: 25 mEq/L (ref 19–32)
Calcium: 9.4 mg/dL (ref 8.4–10.5)
Chloride: 107 mEq/L (ref 96–112)
Creatinine, Ser: 1.11 mg/dL (ref 0.40–1.50)
GFR: 99.86 mL/min (ref >60.00)
Glucose, Bld: 201 mg/dL — ABNORMAL HIGH (ref 70–99)
Potassium: 3.9 mEq/L (ref 3.5–5.1)
Sodium: 139 mEq/L (ref 135–145)
Total Bilirubin: 0.4 mg/dL (ref 0.2–1.2)
Total Protein: 6.7 g/dL (ref 6.0–8.3)

## 2016-09-12 LAB — HEMOGLOBIN A1C: Hgb A1c MFr Bld: 6.9 % — ABNORMAL HIGH (ref 4.6–6.5)

## 2016-09-12 NOTE — Assessment & Plan Note (Addendum)
Checking STD screening per his request and exposure. Treat as appropriate.

## 2016-09-12 NOTE — Progress Notes (Signed)
   Subjective:    Patient ID: Jonathan Taylor, male    DOB: November 04, 1986, 30 y.o.   MRN: [id number]  HPI The patient is a 30 YO man coming in for STD screening. He is very concerned about STDs and with some ongoing sexual activities he is worried about STDs. He denies drainage, discharge, pain with urination, lesion on genitals, no swelling. Also needs check of his diabetes. He never did switch to trulicity after last visit and has been taking metformin 500 mg BID. No complaints about that. No low sugars. Weight is up 1 pound since last visit. No change to diet. Not exercising. Has not had an eye exam recently.   Review of Systems  Constitutional: Positive for activity change and appetite change. Negative for fatigue, fever and unexpected weight change.  HENT: Negative.   Respiratory: Negative.   Cardiovascular: Negative.   Gastrointestinal: Negative.   Genitourinary: Negative.   Neurological: Negative.       Objective:   Physical Exam  Constitutional: He is oriented to person, place, and time. He appears well-developed and well-nourished.  Overweight  HENT:  Head: Normocephalic and atraumatic.  Eyes: EOM are normal.  Cardiovascular: Normal rate and regular rhythm.   Pulmonary/Chest: Effort normal and breath sounds normal. No respiratory distress. He has no wheezes. He has no rales.  Abdominal: Soft. Bowel sounds are normal. He exhibits no distension. There is no tenderness. There is no rebound.  Musculoskeletal: He exhibits no edema.  Neurological: He is alert and oriented to person, place, and time. Coordination normal.  Skin: Skin is warm and dry.   Vitals:   09/12/16 1315  BP: (!) 148/90  Pulse: 81  Resp: 16  Temp: 98.9 F (37.2 C)  TempSrc: Oral  SpO2: 98%  Weight: (!) 376 lb (170.6 kg)  Height: 6' 6.5" (1.994 m)      Assessment & Plan:  Pneumonia 23 given at visit.

## 2016-09-12 NOTE — Progress Notes (Signed)
Pre visit review using our clinic review tool, if applicable. No additional management support is needed unless otherwise documented below in the visit note. 

## 2016-09-12 NOTE — Addendum Note (Signed)
Addended by: Resa Miner R on: 09/12/2016 01:42 PM   Modules accepted: Orders

## 2016-09-12 NOTE — Patient Instructions (Signed)
We will check the labs today and send the results on mychart. If you have forgotten the password you can reset it on the website.   You will get a call about the endocrinologist for the diabetes.   Make sure to get an eye exam soon since it checks for the diabetes in the eyes.   Diabetes and Standards of Medical Care Diabetes is complicated. You may find that your diabetes team includes a dietitian, nurse, diabetes educator, eye doctor, and more. To help everyone know what is going on and to help you get the care you deserve, the following schedule of care was developed to help keep you on track. Below are the tests, exams, vaccines, medicines, education, and plans you will need. HbA1c test This test shows how well you have controlled your glucose over the past 2-3 months. It is used to see if your diabetes management plan needs to be adjusted.   It is performed at least 2 times a year if you are meeting treatment goals.  It is performed 4 times a year if therapy has changed or if you are not meeting treatment goals. Blood pressure test  This test is performed at every routine medical visit. The goal is less than 140/90 mm Hg for most people, but 130/80 mm Hg in some cases. Ask your health care provider about your goal. Dental exam  Follow up with the dentist regularly. Eye exam  If you are diagnosed with type 1 diabetes as a child, get an exam upon reaching the age of 27 years or older and having had diabetes for 3-5 years. Yearly eye exams are recommended after that initial eye exam.  If you are diagnosed with type 1 diabetes as an adult, get an exam within 5 years of diagnosis and then yearly.  If you are diagnosed with type 2 diabetes, get an exam as soon as possible after the diagnosis and then yearly. Foot care exam  Visual foot exams are performed at every routine medical visit. The exams check for cuts, injuries, or other problems with the feet.  You should have a complete foot  exam performed every year. This exam includes an inspection of the structure and skin of your feet, a check of the pulses in your feet, and a check of the sensation in your feet.  Type 1 diabetes: The first exam is performed 5 years after diagnosis.  Type 2 diabetes: The first exam is performed at the time of diagnosis.  Check your feet nightly for cuts, injuries, or other problems with your feet. Tell your health care provider if anything is not healing. Kidney function test (urine microalbumin)  This test is performed once a year.  Type 1 diabetes: The first test is performed 5 years after diagnosis.  Type 2 diabetes: The first test is performed at the time of diagnosis.  A serum creatinine and estimated glomerular filtration rate (eGFR) test is done once a year to assess the level of chronic kidney disease (CKD), if present. Lipid profile (cholesterol, HDL, LDL, triglycerides)  Performed every 5 years for most people.  The goal for LDL is less than 100 mg/dL. If you are at high risk, the goal is less than 70 mg/dL.  The goal for HDL is 40 mg/dL-50 mg/dL for men and 50 mg/dL-60 mg/dL for women. An HDL cholesterol of 60 mg/dL or higher gives some protection against heart disease.  The goal for triglycerides is less than 150 mg/dL. Immunizations  The flu (  influenza) vaccine is recommended yearly for every person 61 months of age or older who has diabetes.  The pneumonia (pneumococcal) vaccine is recommended for every person 74 years of age or older who has diabetes. Adults 29 years of age or older may receive the pneumonia vaccine as a series of two separate shots.  The hepatitis B vaccine is recommended for adults shortly after they have been diagnosed with diabetes.  The Tdap (tetanus, diphtheria, and pertussis) vaccine should be given:  According to normal childhood vaccination schedules, for children.  Every 10 years, for adults who have diabetes. Diabetes self-management  education  Education is recommended at diagnosis and ongoing as needed. Treatment plan  Your treatment plan is reviewed at every medical visit.   This information is not intended to replace advice given to you by your health care provider. Make sure you discuss any questions you have with your health care provider.   Document Released: 08/20/2009 Document Revised: 11/13/2014 Document Reviewed: 03/25/2013 Elsevier Interactive Patient Education [employer].

## 2016-09-12 NOTE — Assessment & Plan Note (Addendum)
Refer to endo per patient request. Taking metformin 500 mg BID and got symptoms with his bowels at 1000 mg BID dosing. Checking HgA1c today and adjust as needed. Not complicated at this time. Given pneumonia 23 today and reminded about the need for the eye exam.

## 2016-09-13 LAB — GC/CHLAMYDIA PROBE AMP
CT Probe RNA: NOT DETECTED
GC Probe RNA: NOT DETECTED

## 2016-09-13 LAB — HIV ANTIBODY (ROUTINE TESTING W REFLEX): HIV 1&2 Ab, 4th Generation: NONREACTIVE

## 2016-09-13 LAB — RPR

## 2016-10-02 ENCOUNTER — Encounter: Payer: Self-pay | Admitting: Endocrinology

## 2016-10-02 ENCOUNTER — Ambulatory Visit (INDEPENDENT_AMBULATORY_CARE_PROVIDER_SITE_OTHER): Payer: BLUE CROSS/BLUE SHIELD | Admitting: Endocrinology

## 2016-10-02 VITALS — BP 144/98 | HR 82 | Ht 79.0 in | Wt 376.0 lb

## 2016-10-02 DIAGNOSIS — E1165 Type 2 diabetes mellitus with hyperglycemia: Secondary | ICD-10-CM | POA: Diagnosis not present

## 2016-10-02 DIAGNOSIS — E1169 Type 2 diabetes mellitus with other specified complication: Secondary | ICD-10-CM

## 2016-10-02 DIAGNOSIS — I10 Essential (primary) hypertension: Secondary | ICD-10-CM

## 2016-10-02 DIAGNOSIS — N521 Erectile dysfunction due to diseases classified elsewhere: Secondary | ICD-10-CM

## 2016-10-02 MED ORDER — SILDENAFIL CITRATE 100 MG PO TABS: 50.0000 mg | ORAL_TABLET | Freq: Every day | ORAL | 1 refills | Status: DC | PRN

## 2016-10-02 NOTE — Progress Notes (Signed)
Patient ID: Jonathan Taylor, male   DOB: [birth year], 30 y.o.   MRN: [id number]            Reason for Appointment: Consultation for Type 2 Diabetes  Referring physician: Sharlet Salina   History of Present Illness:          Date of diagnosis of type 2 diabetes mellitus: 11/2014        Background history:   Appears to have been diagnosed on routine lab work with baseline A1c of 7.2 He has been prescribed metformin since then but has not been able to tolerate this because of diarrhea and abdominal cramps He has previously been irregular with treatment and follow-up because of periodically financial issues He was also prescribed Trulicity in [id number] when his A1c was 6.9 but he did not take this  Recent history:    Non-insulin hypoglycemic drugs the patient is taking are: Metformin 500 bid  Current management, blood sugar patterns and problems identified:  He has not been able take metformin consistently because of problems with tendency to diarrhea and abdominal discomfort  However has taken it for about 3 weeks since he was told that his sugars are high  He has not been instructed on home glucose monitoring, previous lab glucose readings have been mostly in the 120-140 range, most recently 201 after lunch  Also has no meal planning or instructions on diet, tends to have relatively high fat diet and eating fast food regularly  Usually avoiding drinks with sugar but not consistently.  Does not have any exercise regimen and has not been motivated to exercise recently    He is wanting to be more informed about his diabetes and treatment options       Side effects from medications have been: Diarrhea and abdominal cramps with metformin  Compliance with the medical regimen: Fair  Glucose monitoring:  not done, does not have a monitor      Self-care: The diet that the patient has been following is: tries to limit drinks with sugar .     Meal times are:  Breakfast is at 12 PM Dinner: 10:30  PM, he does work night shift    Typical meal intake: Breakfast is eggs and sausage, dinner variable               Dietician visit, most recent: Never               Exercise: none   Weight history: Previous range 340-377  Wt Readings from Last 3 Encounters:  10/02/16 (!) 376 lb (170.6 kg)  09/12/16 (!) 376 lb (170.6 kg)  04/27/16 (!) 377 lb (171 kg)    Glycemic control:   Lab Results  Component Value Date   HGBA1C 6.9 (H) 09/12/2016   HGBA1C 6.9 (H) 04/27/2016   HGBA1C 6.9 (H) 10/07/2015   Lab Results  Component Value Date   MICROALBUR 2.2 (H) 04/27/2016   LDLCALC 84 04/27/2016   CREATININE 1.11 09/12/2016   Lab Results  Component Value Date   MICRALBCREAT 0.7 04/27/2016    Other active problems: See review of systems      Medication List       Accurate as of 10/02/16  8:48 PM. Always use your most recent med list.          naproxen 500 MG tablet Commonly known as:  NAPROSYN Take 1 tablet (500 mg total) by mouth 2 (two) times daily with a meal.   sildenafil 100 MG tablet Commonly known  as:  VIAGRA Take 0.5-1 tablets (50-100 mg total) by mouth daily as needed for erectile dysfunction.       Allergies:  Allergies  Allergen Reactions  . Tomato Anaphylaxis  . Mushroom Extract Complex Swelling    Lips    Past Medical History:  Diagnosis Date  . Blood in stool   . Diabetes mellitus without complication ([place])   . GERD (gastroesophageal reflux disease)   . History of anal fissures     Past Surgical History:  Procedure Laterality Date  . ANAL FISTULECTOMY  2012   with sphhincterotomy  . ANAL FISTULOTOMY N/A 06/29/2015   Procedure: EXCISION PERI RECTAL SCAR/FISTULA, INTERNAL HEMMORRHOIDAL LIGATION, PEXY, MARSUPIALIZATION;  Surgeon: Michael Boston, MD;  Location: [facility];  Service: General;  Laterality: N/A;  . EXAMINATION UNDER ANESTHESIA N/A 06/29/2015   Procedure: EXAM UNDER ANESTHESIA;  Surgeon: Michael Boston, MD;  Location: [facility];  Service:  General;  Laterality: N/A;  . RECTAL SURGERY  08/2010   Dr Zenia Resides    Family History  Problem Relation Age of Onset  . Colon cancer Neg Hx   . Colon polyps Neg Hx   . Diabetes Mother   . Throat cancer Maternal Aunt   . Kidney disease Neg Hx   . Hypertension Mother     Social History:  reports that he quit smoking about 16 months ago. His smoking use included Cigarettes. He smoked 0.50 packs per day. He has never used smokeless tobacco. He reports that he drinks alcohol. He reports that he does not use drugs.   Review of Systems  Constitutional: Negative for weight loss.  HENT: Negative for headaches.   Eyes: Negative for blurred vision.  Respiratory: Negative for shortness of breath.   Cardiovascular: Negative for leg swelling and claudication.  Gastrointestinal: Positive for diarrhea.       Due to metformin  Endocrine: Positive for erectile dysfunction. Negative for fatigue, polydipsia and decreased libido.  Genitourinary: Negative for frequency.  Musculoskeletal: Negative for joint pain.  Skin: Negative for rash.  Neurological: Positive for numbness and tingling.       Mild tingling, occasional numbness in feet     Lipid history: Labs as follows    Lab Results  Component Value Date   CHOL 141 04/27/2016   HDL 43.00 04/27/2016   LDLCALC 84 04/27/2016   TRIG 69.0 04/27/2016   CHOLHDL 3 04/27/2016           Hypertension:Has not been told to have hypertension or treated recently   BP Readings from Last 3 Encounters:  10/02/16 (!) 144/98  09/12/16 (!) 148/90  07/16/16 149/88    Most recent eye exam was never  Most recent foot exam: 11/17    LABS:  No visits with results within 1 Week(s) from this visit.  Latest known visit with results is:  Appointment on 09/12/2016  Component Date Value Ref Range Status  . Hgb A1c MFr Bld 09/12/2016 6.9* 4.6 - 6.5 % Final  . Sodium 09/12/2016 139  135 - 145 mEq/L Final  . Potassium 09/12/2016 3.9  3.5 - 5.1 mEq/L  Final  . Chloride 09/12/2016 107  96 - 112 mEq/L Final  . CO2 09/12/2016 25  19 - 32 mEq/L Final  . Glucose, Bld 09/12/2016 201* 70 - 99 mg/dL Final  . BUN 09/12/2016 13  6 - 23 mg/dL Final  . Creatinine, Ser 09/12/2016 1.11  0.40 - 1.50 mg/dL Final  . Total Bilirubin 09/12/2016 0.4  0.2 - 1.2  mg/dL Final  . Alkaline Phosphatase 09/12/2016 54  39 - 117 U/L Final  . AST 09/12/2016 26  0 - 37 U/L Final  . ALT 09/12/2016 30  0 - 53 U/L Final  . Total Protein 09/12/2016 6.7  6.0 - 8.3 g/dL Final  . Albumin 09/12/2016 3.9  3.5 - 5.2 g/dL Final  . Calcium 09/12/2016 9.4  8.4 - 10.5 mg/dL Final  . GFR 09/12/2016 99.86  >60.00 mL/min Final  . CT Probe RNA 09/13/2016 NOT DETECTED   Final   Comment:                    **Normal Reference Range: NOT DETECTED**   This test was performed using the APTIMA COMBO2 Assay ([employer]   The analytical performance characteristics of this assay, when used to test Sure[employer] specimens have been determined by [employer]     . GC Probe RNA 09/13/2016 NOT DETECTED   Final   Comment:                    **Normal Reference Range: NOT DETECTED**   This test was performed using the APTIMA COMBO2 Assay ([place].).   The analytical performance characteristics of this assay, when used to test [facility]Path specimens have been determined by [employer]     . HIV 1&2 Ab, 4th Generation 09/13/2016 NONREACTIVE  NONREACTIVE Final   Comment:   HIV-1 antigen and HIV-1/HIV-2 antibodies were not detected.  There is no laboratory evidence of HIV infection.   HIV-1/2 Antibody Diff        Not indicated. HIV-1 RNA, Qual TMA          Not indicated.     PLEASE NOTE: This information has been disclosed to you from records whose confidentiality may be protected by state law. If your state requires such protection, then the state law prohibits you from making any further disclosure of the information without the specific written consent of the person  to whom it pertains, or as otherwise permitted by law. A general authorization for the release of medical or other information is NOT sufficient for this purpose.   The performance of this assay has not been clinically validated in patients less than 51 years old.   For additional information please refer to http://education.quest[employer].com/faq/FAQ106.  (This link is being provided for informational/educational purposes only.)     . RPR Ser Ql 09/13/2016 NON REAC  NON REAC Final    Physical Examination:  BP (!) 144/98 (Cuff Size: Large)   Pulse 82   Ht 6\' 7"  (2.007 m)   Wt (!) 376 lb (170.6 kg)   SpO2 98%   BMI 42.36 kg/m   GENERAL:         Patient has generalized obesity.   HEENT:         Eye exam shows normal external appearance. Fundus exam shows no retinopathy. Oral exam shows normal mucosa .  NECK:   There is no lymphadenopathy Thyroid is not enlarged and no nodules felt.  Carotids are normal to palpation and no bruit heard Chest: Mild gynecomastia present on the left LUNGS:         Chest is symmetrical. Lungs are clear to auscultation.Marland Kitchen   HEART:         Heart sounds:  S1 and S2 are normal. No murmur or click heard., no S3 or S4.   ABDOMEN:   There is no distention present. Liver and spleen are not  palpable. No other mass or tenderness present.   NEUROLOGICAL:   Ankle jerks are absent bilaterally.    Diabetic Foot Exam - Simple   Simple Foot Form Diabetic Foot exam was performed with the following findings:  Yes   Visual Inspection No deformities, no ulcerations, no other skin breakdown bilaterally:  Yes Sensation Testing See comments:  Yes Pulse Check Posterior Tibialis and Dorsalis pulse intact bilaterally:  Yes Comments Decreased to absent monofilament sensation on the fourth and fifth toes especially left, mildly decreased on the second and third toes            Vibration sense is Mildly reduced in distal first toes. MUSCULOSKELETAL:  There is no swelling  or deformity of the peripheral joints. Spine is normal to inspection.   EXTREMITIES:     There is no edema. No skin lesions present.Marland Kitchen SKIN:       No rash or lesions of concern.        ASSESSMENT:  Diabetes type 2, mild with A1c 6.9 Morbid obesity     He has had minimal diabetes education Although A1c is 6.9 his blood sugar values have not been consistently high on labs Currently has not monitored blood sugar at home, he does not have a monitor He is regularly consuming high-fat foods and fast food and not exercising Currently however he is wanting to take better care of his diabetes and lose weight Unable to tolerate 500 mg twice a day of metformin because of GI side effects  Complications of diabetes: Peripheral neuropathy, erectile dysfunction  HYPERTENSION: Most likely he has hypertension as blood pressure appears persistently high He will need to be on treatment as per guidelines but he is reluctant to start this today, he wants to work on lifestyle changes first  ?  Hypogonadism: He has no significant decrease in libido but has some erectile dysfunction findings of mild gynecomastia  PLAN:     Patient needs significant lifestyle changes and weight loss for management of his mild diabetes  Also needs significant diabetes education with meal planning and starting home glucose monitoring  He will be referred to the diabetes educator and dietitian to facilitate that.  If he starts home glucose monitoring with the nurse educator he will check once a day or every other day alternating fasting and 2 hours after meals  In the meantime he was encouraged to start exercising at [place]  He was also told to avoid fast food which he is having regularly.  Since he has diarrhea from a very low-dose metformin he can stop this  Consider alternative medications to metformin on the next visit if blood sugars are consistently high and he is not benefiting from efforts to lose weight  May  stop metformin now because of intolerance.  Needs to have baseline eye exam   Trial of VIAGRA 50 or 100 mg as needed for erectile dysfunction Consider checking free testosterone level if symptoms of decreased libido or fatigue are present  Patient Instructions  Avoid fast food    Consultation note has been sent to the referring physician  [facility] 10/02/2016, 8:48 PM   Note: This office note was prepared with Dragon voice recognition system technology. Any transcriptional errors that result from this process are unintentional.

## 2016-10-02 NOTE — Patient Instructions (Signed)
Avoid fast food

## 2016-10-05 ENCOUNTER — Encounter: Payer: BLUE CROSS/BLUE SHIELD | Attending: Endocrinology | Admitting: Skilled Nursing Facility1

## 2016-10-05 ENCOUNTER — Encounter: Payer: Self-pay | Admitting: Skilled Nursing Facility1

## 2016-10-05 DIAGNOSIS — E1169 Type 2 diabetes mellitus with other specified complication: Secondary | ICD-10-CM

## 2016-10-05 DIAGNOSIS — E669 Obesity, unspecified: Secondary | ICD-10-CM

## 2016-10-05 DIAGNOSIS — Z713 Dietary counseling and surveillance: Secondary | ICD-10-CM | POA: Insufficient documentation

## 2016-10-05 DIAGNOSIS — E1165 Type 2 diabetes mellitus with hyperglycemia: Secondary | ICD-10-CM | POA: Diagnosis not present

## 2016-10-05 NOTE — Patient Instructions (Addendum)
-  Always bring your meter with you everywhere you go -Always Properly dispose of your needles:  -Discard in a hard plastic/metal container with a lid (something the needle can't puncture)  -Write Do Not Recycle on the outside of the container  -Example: A laundry detergent bottle -Never use the same needle more than once -Eat 4 carbohydrate choices for each meal and 2 for each snack -A meal: carbohydrates, protein, vegetable -A snack: A Fruit OR Vegetable AND Protein  -Try to be more active -Always pay attention to your body keeping watchful of possible low blood sugar (below 70) or high blood sugar (above 200)  -Before you eat anything for the day: 80-130 -2 hours after you eat 80-180  -Check your blood sugar every day at different times for 2 weeks and then you can start testing a few times a week to keep up with your numbers  -Try to go to the gym 2 days a week for 1 hour: 30 of cardio and 30 weights  -To determine your amount of carbohydrates:  Weight Gain or Loss  Energy Level  Hunger/Fullness Cues  Your blood sugar numbers

## 2016-10-05 NOTE — Progress Notes (Signed)
Diabetes Self-Management Education  Visit Type: First/Initial  Appt. Start Time: 2:00 Appt. End Time: 3:30  10/05/2016  Mr. Jonathan Taylor, identified by name and date of birth, is a 30 y.o. male with a diagnosis of Diabetes: Type 2.   ASSESSMENT  Height 6\' 7"  (2.007 m), weight (!) 372 lb (168.7 kg). Body mass index is 41.91 kg/m. Pt states he works 3rd shift 13 hours 6 days a week. Pt states he wants to have children. Pt states he needs to lose wt and fast. pts states he wants to lose 70 pounds Pt states he Wants to pay a guy to plan his meals. Pt was given contour next lot: [id number], exp: [phone] was his number      Diabetes Self-Management Education - 10/05/16 1407      Visit Information   Visit Type First/Initial     Initial Visit   Diabetes Type Type 2   Are you currently following a meal plan? No   Are you taking your medications as prescribed? No   Date Diagnosed about a year ago     Health Coping   How would you rate your overall health? Fair     Psychosocial Assessment   Patient Belief/Attitude about Diabetes Defeat/Burnout     Pre-Education Assessment   Patient understands the diabetes disease and treatment process. Needs Instruction   Patient understands incorporating nutritional management into lifestyle. Needs Instruction   Patient undertands incorporating physical activity into lifestyle. Needs Instruction   Patient understands using medications safely. Needs Instruction   Patient understands monitoring blood glucose, interpreting and using results Needs Instruction   Patient understands prevention, detection, and treatment of acute complications. Needs Instruction   Patient understands prevention, detection, and treatment of chronic complications. Needs Instruction   Patient understands how to develop strategies to address psychosocial issues. Needs Instruction   Patient understands how to develop strategies to promote health/change behavior. Needs  Instruction     Complications   Last HgB A1C per patient/outside source 6.9 %   How often do you check your blood sugar? 0 times/day (not testing)   Have you had a dilated eye exam in the past 12 months? No   Have you had a dental exam in the past 12 months? No   Are you checking your feet? No     Dietary Intake   Lunch [employer]   Dinner salad----chicken, salad   Beverage(s) water, every saturday 3-4 shots, 5 beers     Exercise   Exercise Type ADL's     Patient Education   Previous Diabetes Education No   Disease state  Definition of diabetes, type 1 and 2, and the diagnosis of diabetes;Factors that contribute to the development of diabetes   Nutrition management  Role of diet in the treatment of diabetes and the relationship between the three main macronutrients and blood glucose level;Food label reading, portion sizes and measuring food.;Carbohydrate counting;Reviewed blood glucose goals for pre and post meals and how to evaluate the patients' food intake on their blood glucose level.;Meal timing in regards to the patients' current diabetes medication.;Effects of alcohol on blood glucose and safety factors with consumption of alcohol.;Information on hints to eating out and maintain blood glucose control.   Physical activity and exercise  Role of exercise on diabetes management, blood pressure control and cardiac health.   Monitoring Taught/evaluated SMBG meter.;Purpose and frequency of SMBG.;Yearly dilated eye exam;Daily foot exams   Acute complications Taught treatment of hypoglycemia - the 15 rule.;Discussed  and identified patients' treatment of hyperglycemia.   Chronic complications Assessed and discussed foot care and prevention of foot problems;Retinopathy and reason for yearly dilated eye exams;Dental care;Nephropathy, what it is, prevention of, the use of ACE, ARB's and early detection of through urine microalbumia.   Psychosocial adjustment Role of stress on diabetes      Individualized Goals (developed by patient)   Nutrition Follow meal plan discussed;Adjust meds/carbs with exercise as discussed   Physical Activity Exercise 1-2 times per week;15 minutes per day   Monitoring  test my blood glucose as discussed;test blood glucose pre and post meals as discussed     Post-Education Assessment   Patient understands the diabetes disease and treatment process. Demonstrates understanding / competency   Patient understands incorporating nutritional management into lifestyle. Demonstrates understanding / competency   Patient undertands incorporating physical activity into lifestyle. Demonstrates understanding / competency   Patient understands using medications safely. Demonstrates understanding / competency   Patient understands monitoring blood glucose, interpreting and using results Demonstrates understanding / competency   Patient understands prevention, detection, and treatment of acute complications. Demonstrates understanding / competency   Patient understands prevention, detection, and treatment of chronic complications. Demonstrates understanding / competency   Patient understands how to develop strategies to address psychosocial issues. Demonstrates understanding / competency   Patient understands how to develop strategies to promote health/change behavior. Demonstrates understanding / competency     Outcomes   Expected Outcomes Demonstrated interest in learning. Expect positive outcomes   Future DMSE PRN   Program Status Completed      Individualized Plan for Diabetes Self-Management Training:   Learning Objective:  Patient will have a greater understanding of diabetes self-management. Patient education plan is to attend individual and/or group sessions per assessed needs and concerns.   Plan:   Patient Instructions  -Always bring your meter with you everywhere you go -Always Properly dispose of your needles:  -Discard in a hard plastic/metal  container with a lid (something the needle can't puncture)  -Write Do Not Recycle on the outside of the container  -Example: A laundry detergent bottle -Never use the same needle more than once -Eat 4 carbohydrate choices for each meal and 2 for each snack -A meal: carbohydrates, protein, vegetable -A snack: A Fruit OR Vegetable AND Protein  -Try to be more active -Always pay attention to your body keeping watchful of possible low blood sugar (below 70) or high blood sugar (above 200)  -Before you eat anything for the day: 80-130 -2 hours after you eat 80-180  -Check your blood sugar every day at different times for 2 weeks and then you can start testing a few times a week to keep up with your numbers  -Try to go to the gym 2 days a week for 1 hour: 30 of cardio and 30 weights  -To determine your amount of carbohydrates:  Weight Gain or Loss  Energy Level  Hunger/Fullness Cues  Your blood sugar numbers   Expected Outcomes:  Demonstrated interest in learning. Expect positive outcomes  Education material provided: Living Well with Diabetes, Meal plan card, My Plate, Snack sheet and Support group flyer  If problems or questions, patient to contact team via:  Phone  Future DSME appointment: PRN

## 2016-10-17 ENCOUNTER — Encounter: Payer: BLUE CROSS/BLUE SHIELD | Admitting: Nutrition

## 2016-11-03 ENCOUNTER — Ambulatory Visit: Payer: BLUE CROSS/BLUE SHIELD | Admitting: Internal Medicine

## 2016-11-07 ENCOUNTER — Other Ambulatory Visit: Payer: Self-pay | Admitting: Endocrinology

## 2016-11-07 DIAGNOSIS — E1165 Type 2 diabetes mellitus with hyperglycemia: Secondary | ICD-10-CM

## 2016-11-09 ENCOUNTER — Encounter: Payer: BLUE CROSS/BLUE SHIELD | Admitting: Dietician

## 2016-11-13 ENCOUNTER — Other Ambulatory Visit (INDEPENDENT_AMBULATORY_CARE_PROVIDER_SITE_OTHER): Payer: Self-pay

## 2016-11-13 DIAGNOSIS — E1165 Type 2 diabetes mellitus with hyperglycemia: Secondary | ICD-10-CM

## 2016-11-13 LAB — COMPREHENSIVE METABOLIC PANEL
ALT: 31 U/L (ref 0–53)
AST: 28 U/L (ref 0–37)
Albumin: 3.9 g/dL (ref 3.5–5.2)
Alkaline Phosphatase: 60 U/L (ref 39–117)
BUN: 14 mg/dL (ref 6–23)
CO2: 29 mEq/L (ref 19–32)
Calcium: 9.8 mg/dL (ref 8.4–10.5)
Chloride: 103 mEq/L (ref 96–112)
Creatinine, Ser: 1.11 mg/dL (ref 0.40–1.50)
GFR: 99.75 mL/min (ref >60.00)
Glucose, Bld: 195 mg/dL — ABNORMAL HIGH (ref 70–99)
Potassium: 3.9 mEq/L (ref 3.5–5.1)
Sodium: 138 mEq/L (ref 135–145)
Total Bilirubin: 0.4 mg/dL (ref 0.2–1.2)
Total Protein: 6.9 g/dL (ref 6.0–8.3)

## 2016-11-14 LAB — FRUCTOSAMINE: Fructosamine: 265 umol/L (ref 0–285)

## 2016-11-16 ENCOUNTER — Encounter: Payer: Self-pay | Admitting: Endocrinology

## 2016-11-16 ENCOUNTER — Ambulatory Visit (INDEPENDENT_AMBULATORY_CARE_PROVIDER_SITE_OTHER): Payer: BLUE CROSS/BLUE SHIELD | Admitting: Endocrinology

## 2016-11-16 ENCOUNTER — Other Ambulatory Visit: Payer: Self-pay

## 2016-11-16 VITALS — BP 132/86 | HR 73 | Ht 79.0 in | Wt 363.0 lb

## 2016-11-16 DIAGNOSIS — E1165 Type 2 diabetes mellitus with hyperglycemia: Secondary | ICD-10-CM | POA: Diagnosis not present

## 2016-11-16 MED ORDER — SILDENAFIL CITRATE 100 MG PO TABS: 100.0000 mg | ORAL_TABLET | Freq: Every day | ORAL | 1 refills | Status: DC | PRN

## 2016-11-16 MED ORDER — GLUCOSE BLOOD VI STRP: ORAL_STRIP | 5 refills | Status: DC

## 2016-11-16 NOTE — Patient Instructions (Signed)
Check blood sugars on waking up  1-2 x weekly  Also check blood sugars about 2 hours after a meal and do this after different meals by rotation  Recommended blood sugar levels on waking up is 90-130 and about 2 hours after meal is 130-160  Please bring your blood sugar monitor to each visit, thank you

## 2016-11-16 NOTE — Progress Notes (Signed)
Patient ID: Jonathan Taylor, male   DOB: 1986-04-29, 31 y.o.   MRN: [id number]            Reason for Appointment: Follow-up for Type 2 Diabetes  Referring physician: Sharlet Salina   History of Present Illness:          Date of diagnosis of type 2 diabetes mellitus: 11/2014        Background history:   Appears to have been diagnosed on routine lab work with baseline A1c of 7.2 He had been prescribed metformin since then but has not been able to tolerate this because of diarrhea and abdominal cramps He has previously been irregular with treatment and follow-up because of periodically financial issues He was also prescribed Trulicity in [id number] when his A1c was 6.9 but he did not take this  Recent history:    Non-insulin hypoglycemic drugs the patient is taking are: None now, was previously on Metformin 500 bid  Last A1c was 6.9  Current management, blood sugar patterns and problems identified:  His metformin was stopped on his initial visit because of side effects  Although he was advised to start home glucose monitoring he has done this very sporadically and did not bring his monitor today, he was given a meter by the diabetes educator  His fructosamine is upper normal at 265  However lab glucose after lunch was 195, patient however thinks that he has not had any readings over 145 after meals  Patient did see the dietitian on 10/05/16 and he thinks he is making changes in his diet; he is also wanting to go on a vegetable and fruit diet for a month without supervision  He has been able to lose weight  However has not started any exercise program as yet       Side effects from medications have been: Diarrhea and abdominal cramps with metformin  Compliance with the medical regimen: Fair  Glucose monitoring:  using contour meter  Occasionally, highest glucose 145 pc by recall      Self-care: The diet that the patient has been following is: tries to limit drinks with sugar  .       Meal times are:  Breakfast is at 12 PM Dinner: 10:30 PM, he does work night shift    Typical meal intake: Breakfast is eggs and sausage, dinner variable               Dietician visit, most recent: Never               Exercise: none   Weight history: Previous range 340-377  Wt Readings from Last 3 Encounters:  11/16/16 (!) 363 lb (164.7 kg)  10/05/16 (!) 372 lb (168.7 kg)  10/02/16 (!) 376 lb (170.6 kg)    Glycemic control:   Lab Results  Component Value Date   HGBA1C 6.9 (H) 09/12/2016   HGBA1C 6.9 (H) 04/27/2016   HGBA1C 6.9 (H) 10/07/2015   Lab Results  Component Value Date   MICROALBUR 2.2 (H) 04/27/2016   LDLCALC 84 04/27/2016   CREATININE 1.11 11/13/2016   Lab Results  Component Value Date   MICRALBCREAT 0.7 04/27/2016   Lab on 11/13/2016  Component Date Value Ref Range Status  . Sodium 11/13/2016 138  135 - 145 mEq/L Final  . Potassium 11/13/2016 3.9  3.5 - 5.1 mEq/L Final  . Chloride 11/13/2016 103  96 - 112 mEq/L Final  . CO2 11/13/2016 29  19 - 32 mEq/L Final  . Glucose,  Bld 11/13/2016 195* 70 - 99 mg/dL Final  . BUN 11/13/2016 14  6 - 23 mg/dL Final  . Creatinine, Ser 11/13/2016 1.11  0.40 - 1.50 mg/dL Final  . Total Bilirubin 11/13/2016 0.4  0.2 - 1.2 mg/dL Final  . Alkaline Phosphatase 11/13/2016 60  39 - 117 U/L Final  . AST 11/13/2016 28  0 - 37 U/L Final  . ALT 11/13/2016 31  0 - 53 U/L Final  . Total Protein 11/13/2016 6.9  6.0 - 8.3 g/dL Final  . Albumin 11/13/2016 3.9  3.5 - 5.2 g/dL Final  . Calcium 11/13/2016 9.8  8.4 - 10.5 mg/dL Final  . GFR 11/13/2016 99.75  >60.00 mL/min Final  . Fructosamine 11/14/2016 265  0 - 285 umol/L Final   Comment: Published reference interval for apparently healthy subjects between age 27 and 37 is 29 - 285 umol/L and in a poorly controlled diabetic population is 228 - 563 umol/L with a mean of 396 umol/L.     Other active problems: See review of systems    Allergies as of 11/16/2016      Reactions    Tomato Anaphylaxis   Mushroom Extract Complex Swelling   Lips   Shrimp [shellfish Allergy]       Medication List       Accurate as of 11/16/16 11:59 PM. Always use your most recent med list.          glucose blood test strip Commonly known as:  BAYER CONTOUR TEST Use to test blood sugar once daily   naproxen 500 MG tablet Commonly known as:  NAPROSYN Take 1 tablet (500 mg total) by mouth 2 (two) times daily with a meal.   sildenafil 100 MG tablet Commonly known as:  VIAGRA Take 1 tablet (100 mg total) by mouth daily as needed for erectile dysfunction.       Allergies:  Allergies  Allergen Reactions  . Tomato Anaphylaxis  . Mushroom Extract Complex Swelling    Lips  . Shrimp [Shellfish Allergy]     Past Medical History:  Diagnosis Date  . Blood in stool   . Diabetes mellitus without complication ([place])   . GERD (gastroesophageal reflux disease)   . History of anal fissures   . Low sperm motility     Past Surgical History:  Procedure Laterality Date  . ANAL FISTULECTOMY  2012   with sphhincterotomy  . ANAL FISTULOTOMY N/A 06/29/2015   Procedure: EXCISION PERI RECTAL SCAR/FISTULA, INTERNAL HEMMORRHOIDAL LIGATION, PEXY, MARSUPIALIZATION;  Surgeon: Michael Boston, MD;  Location: [facility];  Service: General;  Laterality: N/A;  . EXAMINATION UNDER ANESTHESIA N/A 06/29/2015   Procedure: EXAM UNDER ANESTHESIA;  Surgeon: Michael Boston, MD;  Location: [facility];  Service: General;  Laterality: N/A;  . RECTAL SURGERY  08/2010   Dr Zenia Resides    Family History  Problem Relation Age of Onset  . Diabetes Mother   . Hypertension Mother   . Throat cancer Maternal Aunt   . Colon cancer Neg Hx   . Colon polyps Neg Hx   . Kidney disease Neg Hx     Social History:  reports that he quit smoking about 17 months ago. His smoking use included Cigarettes. He has a 5.00 pack-year smoking history. He has never used smokeless tobacco. He reports that he drinks alcohol. He reports that he does  not use drugs.   Review of Systems     Lipid history: Labs as follows    Lab Results  Component Value Date   CHOL 141 04/27/2016   HDL 43.00 04/27/2016   LDLCALC 84 04/27/2016   TRIG 69.0 04/27/2016   CHOLHDL 3 04/27/2016           Hypertension:Has not been told to have hypertension or treated For this but blood pressure has been previously high   BP Readings from Last 3 Encounters:  11/16/16 132/86  10/02/16 (!) 144/98  09/12/16 (!) 148/90    Most recent eye exam, years ago  Most recent foot exam: 11/17    LABS:  Lab on 11/13/2016  Component Date Value Ref Range Status  . Sodium 11/13/2016 138  135 - 145 mEq/L Final  . Potassium 11/13/2016 3.9  3.5 - 5.1 mEq/L Final  . Chloride 11/13/2016 103  96 - 112 mEq/L Final  . CO2 11/13/2016 29  19 - 32 mEq/L Final  . Glucose, Bld 11/13/2016 195* 70 - 99 mg/dL Final  . BUN 11/13/2016 14  6 - 23 mg/dL Final  . Creatinine, Ser 11/13/2016 1.11  0.40 - 1.50 mg/dL Final  . Total Bilirubin 11/13/2016 0.4  0.2 - 1.2 mg/dL Final  . Alkaline Phosphatase 11/13/2016 60  39 - 117 U/L Final  . AST 11/13/2016 28  0 - 37 U/L Final  . ALT 11/13/2016 31  0 - 53 U/L Final  . Total Protein 11/13/2016 6.9  6.0 - 8.3 g/dL Final  . Albumin 11/13/2016 3.9  3.5 - 5.2 g/dL Final  . Calcium 11/13/2016 9.8  8.4 - 10.5 mg/dL Final  . GFR 11/13/2016 99.75  >60.00 mL/min Final  . Fructosamine 11/14/2016 265  0 - 285 umol/L Final   Comment: Published reference interval for apparently healthy subjects between age 71 and 71 is 5 - 285 umol/L and in a poorly controlled diabetic population is 228 - 563 umol/L with a mean of 396 umol/L.     Physical Examination:  BP 132/86   Pulse 73   Ht 6\' 7"  (2.007 m)   Wt (!) 363 lb (164.7 kg)   SpO2 98%   BMI 40.89 kg/m        ASSESSMENT:  Diabetes type 2, mild with A1c 6.9 Morbid obesity     He has had Improvement in his diet and is able to lose weight now, about 9 pounds However has not  started any exercise regimen Blood sugars may be still relatively higher than normal with lab glucose 195 and fructosamine 265  He does have the ability to start exercising now and is planning to do so Discussed that if he wants to do a vegetarian diet in the next month he will need to have some protein with each meal   HYPERTENSION: Most likely he has mild hypertension but his blood pressure is near normal and improved now, may improve further with exercise and weight loss  Erectile dysfunction: Probably multifactorial reasons for this and he can continue to take Viagra as needed, 50 mg  PLAN:    Exercise program  Diet modifications as discussed  Periodic glucose monitoring and call if blood sugar getting higher  A1c on the next visit  Viagra refilled  Patient Instructions  Check blood sugars on waking up  1-2 x weekly  Also check blood sugars about 2 hours after a meal and do this after different meals by rotation  Recommended blood sugar levels on waking up is 90-130 and about 2 hours after meal is 130-160  Please bring your blood sugar monitor to each visit,  thank you        [facility] 11/17/2016, 12:24 PM   Note: This office note was prepared with Dragon voice recognition system technology. Any transcriptional errors that result from this process are unintentional.

## 2016-11-22 ENCOUNTER — Ambulatory Visit: Payer: BLUE CROSS/BLUE SHIELD | Admitting: Internal Medicine

## 2016-11-29 ENCOUNTER — Encounter: Payer: Self-pay | Admitting: Internal Medicine

## 2016-11-29 ENCOUNTER — Ambulatory Visit (INDEPENDENT_AMBULATORY_CARE_PROVIDER_SITE_OTHER): Payer: BLUE CROSS/BLUE SHIELD | Admitting: Internal Medicine

## 2016-11-29 VITALS — BP 142/102 | HR 76 | Temp 98.1°F | Ht 79.0 in | Wt 367.0 lb

## 2016-11-29 DIAGNOSIS — G479 Sleep disorder, unspecified: Secondary | ICD-10-CM

## 2016-11-29 DIAGNOSIS — E1169 Type 2 diabetes mellitus with other specified complication: Secondary | ICD-10-CM | POA: Diagnosis not present

## 2016-11-29 DIAGNOSIS — E669 Obesity, unspecified: Secondary | ICD-10-CM

## 2016-11-29 DIAGNOSIS — I10 Essential (primary) hypertension: Secondary | ICD-10-CM

## 2016-11-29 NOTE — Patient Instructions (Addendum)
Continue with diet and exercise program  GOAL weight loss is 10lbs by next visit  Follow up in 3 mos for routine visit. Follow up with Dr Dwyane Dee as scheduled

## 2016-11-29 NOTE — Progress Notes (Signed)
Patient ID: Jonathan Taylor, male   DOB: 11/10/1985, 31 y.o.   MRN: [id number]    Location:  [facility] Place of Service: OFFICE    Advanced Directive information Does Patient Have a Medical Advance Directive?: No, Would patient like information on creating a medical advance directive?: No - Patient declined  Chief Complaint  Patient presents with  . Establish Care    New patient to establish care    HPI:  31 yo male seen today as a new pt. 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 He has not had much sleep today as he got off at [place].  DM - followed by Endo Dr Dwyane Dee. A1c 6.9%. He is diet controlled. LDL 84. Urine microalbumin/Cr ratio 0.7.  HTN - diet controlled. He prefers to hold off meds at this time. He has actively changed diet and increased exercise. He has lost 7 lbs in the last month. He is trying to conceive.   Morbid obesity - he is on a diet and exercise program. He is down 7 lbs in last month per pt. BMI 41.34; wt 367 lb  Past Medical History:  Diagnosis Date  . Blood in stool   . Diabetes mellitus without complication ([place])   . GERD (gastroesophageal reflux disease)   . History of anal fissures   . Low sperm motility     Past Surgical History:  Procedure Laterality Date  . ANAL FISTULECTOMY  2012   with sphhincterotomy  . ANAL FISTULOTOMY N/A 06/29/2015   Procedure: EXCISION PERI RECTAL SCAR/FISTULA, INTERNAL HEMMORRHOIDAL LIGATION, PEXY, MARSUPIALIZATION;  Surgeon: Michael Boston, MD;  Location: [facility];  Service: General;  Laterality: N/A;  . EXAMINATION UNDER ANESTHESIA N/A 06/29/2015   Procedure: EXAM UNDER ANESTHESIA;  Surgeon: Michael Boston, MD;  Location: [facility];  Service: General;  Laterality: N/A;  . RECTAL SURGERY  08/2010   Dr Zenia Resides    Patient Care Team: Gildardo Cranker, DO as PCP - General (Internal Medicine)  Social History   Social History  . Marital status: Married    Spouse name: N/A  . Number of children: 0  . Years of education: N/A   Occupational  History  . Glass blower/designer; QC specialist    Social History Main Topics  . Smoking status: Former Smoker    Packs/day: 0.50    Years: 10.00    Types: Cigarettes    Quit date: 05/26/2015  . Smokeless tobacco: Never Used     Comment: Pt given handout  . Alcohol use Yes     Comment: Occassionally  . Drug use: No  . Sexual activity: Yes   Other Topics Concern  . Not on file   Social History Narrative   Diet? Low carb meat/veggies water      Do you drink/eat things with caffeine? sometimes      Marital status?        married                            What year were you married? 2015      Do you live in a house, apartment, assisted living, condo, trailer, etc.? townhouse      Is it one or more stories? 2 story      How many persons live in your home? 3      Do you have any pets in your home? (please list) yes 1 dog      Current or past  profession: Chief Financial Officer      Do you exercise?       Only at work                               Type & how often? daily      Do you have a living will? n/a      Do you have a DNR form? n/a                                 If not, do you want to discuss one?      Do you have signed POA/HPOA for forms?  n/a        reports that he quit smoking about 18 months ago. His smoking use included Cigarettes. He has a 5.00 pack-year smoking history. He has never used smokeless tobacco. He reports that he drinks alcohol. He reports that he does not use drugs.  Family History  Problem Relation Age of Onset  . Diabetes Mother   . Hypertension Mother   . Throat cancer Maternal Aunt   . Colon cancer Neg Hx   . Colon polyps Neg Hx   . Kidney disease Neg Hx    Family Status  Relation Status  . Mother Alive  . Father Alive  . Maternal Aunt   . Daughter Alive  . Neg Hx     Immunization History  Administered Date(s) Administered  . Influenza,inj,Quad PF,36+ Mos 11/16/2014  . Pneumococcal Polysaccharide-23 09/12/2016  . Tdap  11/16/2014    Allergies  Allergen Reactions  . Tomato Anaphylaxis  . Mushroom Extract Complex Swelling    Lips  . Shrimp [Shellfish Allergy]     Medications: Patient's Medications  New Prescriptions   No medications on file  Previous Medications   GLUCOSE BLOOD (BAYER CONTOUR TEST) TEST STRIP    Use to test blood sugar once daily   NAPROXEN (NAPROSYN) 500 MG TABLET    Take 1 tablet (500 mg total) by mouth 2 (two) times daily with a meal.   SILDENAFIL (VIAGRA) 100 MG TABLET    Take 1 tablet (100 mg total) by mouth daily as needed for erectile dysfunction.  Modified Medications   No medications on file  Discontinued Medications   No medications on file    Review of Systems  Constitutional: Positive for diaphoresis, fatigue and unexpected weight change (gain).  Eyes: Positive for visual disturbance (reduced).  All other systems reviewed and are negative.   Vitals:   11/29/16 0835  BP: (!) 142/102  Pulse: 76  Temp: 98.1 F (36.7 C)  TempSrc: Oral  SpO2: 97%  Weight: (!) 367 lb (166.5 kg)  Height: 6\' 7"  (2.007 m)   Body mass index is 41.34 kg/m.  Physical Exam  Constitutional: He is oriented to person, place, and time. He appears well-developed and well-nourished.  Looks tired in NAD  HENT:  Mouth/Throat: Oropharynx is clear and moist.  Eyes: Pupils are equal, round, and reactive to light. No scleral icterus.  Neck: Neck supple. Carotid bruit is not present. No thyromegaly present.  Cardiovascular: Normal rate, regular rhythm, normal heart sounds and intact distal pulses.  Exam reveals no gallop and no friction rub.   No murmur heard. no distal LE swelling. No calf TTP  Pulmonary/Chest: Effort normal and breath sounds normal. He has no wheezes. He has no rales. He  exhibits no tenderness.  Abdominal: Soft. Bowel sounds are normal. He exhibits no distension, no abdominal bruit, no pulsatile midline mass and no mass. There is no hepatomegaly. There is no tenderness.  There is no rebound and no guarding.  obese  Lymphadenopathy:    He has no cervical adenopathy.  Neurological: He is alert and oriented to person, place, and time.  Skin: Skin is warm and dry. No rash noted.  Multiple tattoos  Psychiatric: He has a normal mood and affect. His behavior is normal. Judgment and thought content normal.  He is cranky today due to lack of sleep     Labs reviewed: Lab on 11/13/2016  Component Date Value Ref Range Status  . Sodium 11/13/2016 138  135 - 145 mEq/L Final  . Potassium 11/13/2016 3.9  3.5 - 5.1 mEq/L Final  . Chloride 11/13/2016 103  96 - 112 mEq/L Final  . CO2 11/13/2016 29  19 - 32 mEq/L Final  . Glucose, Bld 11/13/2016 195* 70 - 99 mg/dL Final  . BUN 11/13/2016 14  6 - 23 mg/dL Final  . Creatinine, Ser 11/13/2016 1.11  0.40 - 1.50 mg/dL Final  . Total Bilirubin 11/13/2016 0.4  0.2 - 1.2 mg/dL Final  . Alkaline Phosphatase 11/13/2016 60  39 - 117 U/L Final  . AST 11/13/2016 28  0 - 37 U/L Final  . ALT 11/13/2016 31  0 - 53 U/L Final  . Total Protein 11/13/2016 6.9  6.0 - 8.3 g/dL Final  . Albumin 11/13/2016 3.9  3.5 - 5.2 g/dL Final  . Calcium 11/13/2016 9.8  8.4 - 10.5 mg/dL Final  . GFR 11/13/2016 99.75  >60.00 mL/min Final  . Fructosamine 11/13/2016 265  0 - 285 umol/L Final   Comment: Published reference interval for apparently healthy subjects between age 65 and 21 is 62 - 285 umol/L and in a poorly controlled diabetic population is 228 - 563 umol/L with a mean of 396 umol/L.   Appointment on 09/12/2016  Component Date Value Ref Range Status  . Hgb A1c MFr Bld 09/12/2016 6.9* 4.6 - 6.5 % Final  . Sodium 09/12/2016 139  135 - 145 mEq/L Final  . Potassium 09/12/2016 3.9  3.5 - 5.1 mEq/L Final  . Chloride 09/12/2016 107  96 - 112 mEq/L Final  . CO2 09/12/2016 25  19 - 32 mEq/L Final  . Glucose, Bld 09/12/2016 201* 70 - 99 mg/dL Final  . BUN 09/12/2016 13  6 - 23 mg/dL Final  . Creatinine, Ser 09/12/2016 1.11  0.40 - 1.50 mg/dL  Final  . Total Bilirubin 09/12/2016 0.4  0.2 - 1.2 mg/dL Final  . Alkaline Phosphatase 09/12/2016 54  39 - 117 U/L Final  . AST 09/12/2016 26  0 - 37 U/L Final  . ALT 09/12/2016 30  0 - 53 U/L Final  . Total Protein 09/12/2016 6.7  6.0 - 8.3 g/dL Final  . Albumin 09/12/2016 3.9  3.5 - 5.2 g/dL Final  . Calcium 09/12/2016 9.4  8.4 - 10.5 mg/dL Final  . GFR 09/12/2016 99.86  >60.00 mL/min Final  . CT Probe RNA 09/12/2016 NOT DETECTED   Final   Comment:                    **Normal Reference Range: NOT DETECTED**   This test was performed using the APTIMA COMBO2 Assay ([place].).   The analytical performance characteristics of this assay, when used to test SurePath specimens have been determined by  [facility]     . GC Probe RNA 09/12/2016 NOT DETECTED   Final   Comment:                    **Normal Reference Range: NOT DETECTED**   This test was performed using the APTIMA COMBO2 Assay ([place]   The analytical performance characteristics of this assay, when used to test SurePath specimens have been determined by [employer]     . HIV 1&2 Ab, 4th Generation 09/12/2016 NONREACTIVE  NONREACTIVE Final   Comment:   HIV-1 antigen and HIV-1/HIV-2 antibodies were not detected.  There is no laboratory evidence of HIV infection.   HIV-1/2 Antibody Diff        Not indicated. HIV-1 RNA, Qual TMA          Not indicated.     PLEASE NOTE: This information has been disclosed to you from records whose confidentiality may be protected by state law. If your state requires such protection, then the state law prohibits you from making any further disclosure of the information without the specific written consent of the person to whom it pertains, or as otherwise permitted by law. A general authorization for the release of medical or other information is NOT sufficient for this purpose.   The performance of this assay has not been clinically validated in patients  less than 45 years old.   For additional information please refer to http://education.[facility].com/faq/FAQ106.  (This link is being provided for informational/educational purposes only.)     . RPR Ser Ql 09/12/2016 NON REAC  NON REAC Final    No results found.   Assessment/Plan   ICD-9-CM ICD-10-CM   1. Diabetes mellitus type 2 in obese (HCC) 250.00 E11.69    278.00 E66.9   2. Essential hypertension 401.9 I10   3. Morbid obesity, unspecified obesity type ([place]) 278.01 E66.01   4. Sleep disturbance due to working 3rd shift 780.50 G47.9    Continue with diet and exercise program  GOAL weight loss is 10lbs by next visit  Follow up in 3 mos for routine visit.   Follow up with Dr Dwyane Dee as scheduled  Will need lipid panel next Jasnoor Trussell. Perlie Gold  [facility] [address] [place], [place] [address] [phone] Cell (Monday-Friday 8 AM - 5 PM) [phone] After 5 PM and follow prompts

## 2017-01-03 ENCOUNTER — Ambulatory Visit: Payer: BLUE CROSS/BLUE SHIELD | Admitting: Skilled Nursing Facility1

## 2017-01-12 ENCOUNTER — Other Ambulatory Visit (INDEPENDENT_AMBULATORY_CARE_PROVIDER_SITE_OTHER): Payer: BLUE CROSS/BLUE SHIELD

## 2017-01-12 DIAGNOSIS — E1165 Type 2 diabetes mellitus with hyperglycemia: Secondary | ICD-10-CM

## 2017-01-12 LAB — BASIC METABOLIC PANEL
BUN: 13 mg/dL (ref 6–23)
CO2: 25 mEq/L (ref 19–32)
Calcium: 9.6 mg/dL (ref 8.4–10.5)
Chloride: 107 mEq/L (ref 96–112)
Creatinine, Ser: 0.89 mg/dL (ref 0.40–1.50)
GFR: 128.57 mL/min (ref >60.00)
Glucose, Bld: 136 mg/dL — ABNORMAL HIGH (ref 70–99)
Potassium: 3.9 mEq/L (ref 3.5–5.1)
Sodium: 138 mEq/L (ref 135–145)

## 2017-01-12 LAB — HEMOGLOBIN A1C: Hgb A1c MFr Bld: 7.5 % — ABNORMAL HIGH (ref 4.6–6.5)

## 2017-01-15 ENCOUNTER — Ambulatory Visit: Payer: BLUE CROSS/BLUE SHIELD | Admitting: Endocrinology

## 2017-03-12 ENCOUNTER — Encounter (HOSPITAL_COMMUNITY): Payer: Self-pay | Admitting: *Deleted

## 2017-03-12 ENCOUNTER — Ambulatory Visit (HOSPITAL_COMMUNITY)
Admission: EM | Admit: 2017-03-12 | Discharge: 2017-03-12 | Disposition: A | Discharge status: Home / Self Care | Payer: BLUE CROSS/BLUE SHIELD | Attending: Internal Medicine | Admitting: Internal Medicine

## 2017-03-12 DIAGNOSIS — E119 Type 2 diabetes mellitus without complications: Secondary | ICD-10-CM | POA: Insufficient documentation

## 2017-03-12 DIAGNOSIS — K219 Gastro-esophageal reflux disease without esophagitis: Secondary | ICD-10-CM

## 2017-03-12 DIAGNOSIS — R079 Chest pain, unspecified: Secondary | ICD-10-CM

## 2017-03-12 DIAGNOSIS — R0602 Shortness of breath: Secondary | ICD-10-CM | POA: Diagnosis not present

## 2017-03-12 DIAGNOSIS — I10 Essential (primary) hypertension: Secondary | ICD-10-CM | POA: Diagnosis not present

## 2017-03-12 DIAGNOSIS — E669 Obesity, unspecified: Secondary | ICD-10-CM | POA: Insufficient documentation

## 2017-03-12 DIAGNOSIS — Z113 Encounter for screening for infections with a predominantly sexual mode of transmission: Secondary | ICD-10-CM | POA: Diagnosis not present

## 2017-03-12 DIAGNOSIS — Z79899 Other long term (current) drug therapy: Secondary | ICD-10-CM | POA: Insufficient documentation

## 2017-03-12 DIAGNOSIS — Z91013 Allergy to seafood: Secondary | ICD-10-CM | POA: Diagnosis not present

## 2017-03-12 DIAGNOSIS — R0789 Other chest pain: Secondary | ICD-10-CM | POA: Diagnosis not present

## 2017-03-12 DIAGNOSIS — Z87891 Personal history of nicotine dependence: Secondary | ICD-10-CM | POA: Insufficient documentation

## 2017-03-12 MED ORDER — RANITIDINE HCL 300 MG PO TABS: 300.0000 mg | ORAL_TABLET | Freq: Every day | ORAL | 2 refills | Status: DC

## 2017-03-12 MED ORDER — OMEPRAZOLE 40 MG PO CPDR: 40.0000 mg | DELAYED_RELEASE_CAPSULE | Freq: Two times a day (BID) | ORAL | 0 refills | Status: DC

## 2017-03-12 NOTE — ED Provider Notes (Signed)
CSN: [id number]     Arrival date & time 03/12/17  1637 History   First MD Initiated Contact with Patient 03/12/17 1742     Chief Complaint  Patient presents with  . Chest Pain   (Consider location/radiation/quality/duration/timing/severity/associated sxs/prior Treatment) 31 year old obese male with a prior history of diabetes, hypertension, and gastric reflux presents to clinic with a 24-hour history of chest pressure and discomfort radiating to the left arm.his pain is not related to exertion, occurring at rest. He does sleep with 3 pillows, states that he has had a burning, and fullness in his throat. Family history is significant for diabetes, hypertension, and various types of cancer. He does smoke, has a 10 year pack history, denies alcohol use, denies recreational drug use.   The history is provided by the patient.    Past Medical History:  Diagnosis Date  . Blood in stool   . Diabetes mellitus without complication ([place])   . GERD (gastroesophageal reflux disease)   . History of anal fissures   . Low sperm motility    Past Surgical History:  Procedure Laterality Date  . ANAL FISTULECTOMY  2012   with sphhincterotomy  . ANAL FISTULOTOMY N/A 06/29/2015   Procedure: EXCISION PERI RECTAL SCAR/FISTULA, INTERNAL HEMMORRHOIDAL LIGATION, PEXY, MARSUPIALIZATION;  Surgeon: Michael Boston, MD;  Location: [facility];  Service: General;  Laterality: N/A;  . EXAMINATION UNDER ANESTHESIA N/A 06/29/2015   Procedure: EXAM UNDER ANESTHESIA;  Surgeon: Michael Boston, MD;  Location: [facility];  Service: General;  Laterality: N/A;  . RECTAL SURGERY  08/2010   Dr Zenia Resides   Family History  Problem Relation Age of Onset  . Diabetes Mother   . Hypertension Mother   . Throat cancer Maternal Aunt   . Colon cancer Neg Hx   . Colon polyps Neg Hx   . Kidney disease Neg Hx    Social History  Substance Use Topics  . Smoking status: Former Smoker    Packs/day: 0.50    Years: 10.00    Types: Cigarettes    Quit  date: 05/26/2015  . Smokeless tobacco: Never Used     Comment: Pt given handout  . Alcohol use Yes     Comment: Occassionally    Review of Systems  Constitutional: Negative.   HENT: Negative.   Respiratory: Positive for cough, chest tightness and shortness of breath. Negative for wheezing.   Cardiovascular: Positive for chest pain. Negative for palpitations.  Gastrointestinal: Negative for abdominal pain, diarrhea, nausea and vomiting.  Genitourinary: Negative.   Musculoskeletal: Negative.   Skin: Negative.   Neurological: Negative.     Allergies  Tomato; Mushroom extract complex; and Shrimp [shellfish allergy]  Home Medications   Prior to Admission medications   Medication Sig Start Date End Date Taking? Authorizing Provider  glucose blood (BAYER CONTOUR TEST) test strip Use to test blood sugar once daily 11/16/16   Elayne Snare, MD  naproxen (NAPROSYN) 500 MG tablet Take 1 tablet (500 mg total) by mouth 2 (two) times daily with a meal. 06/29/15   Michael Boston, MD  omeprazole (PRILOSEC) 40 MG capsule Take 1 capsule (40 mg total) by mouth 2 (two) times daily. 03/12/17 03/26/17  Barnet Glasgow, NP  ranitidine (ZANTAC) 300 MG tablet Take 1 tablet (300 mg total) by mouth at bedtime. 03/12/17   Barnet Glasgow, NP  sildenafil (VIAGRA) 100 MG tablet Take 1 tablet (100 mg total) by mouth daily as needed for erectile dysfunction. 11/16/16   Elayne Snare, MD   Meds  Ordered and Administered this Visit  Medications - No data to display  BP (!) 142/77 (BP Location: Right Arm)   Pulse 82   Temp 98.8 F (37.1 C) (Oral)   Resp 18   SpO2 99%  No data found.   Physical Exam  Constitutional: He is oriented to person, place, and time. He appears well-developed and well-nourished. No distress.  HENT:  Head: Normocephalic and atraumatic.  Right Ear: External ear normal.  Left Ear: External ear normal.  Eyes: Conjunctivae are normal.  Neck: Normal range of motion. No JVD present.   Cardiovascular: Normal rate and regular rhythm.   Pulmonary/Chest: Effort normal and breath sounds normal.  Abdominal: Soft. Bowel sounds are normal.  Musculoskeletal: He exhibits no edema.  Lymphadenopathy:    He has no cervical adenopathy.  Neurological: He is alert and oriented to person, place, and time.  Skin: Skin is warm and dry. Capillary refill takes less than 2 seconds. He is not diaphoretic.  Psychiatric: He has a normal mood and affect. His behavior is normal.  Nursing note and vitals reviewed.   Urgent Care Course     ED EKG Date/Time: 03/12/2017 7:07 PM Performed by: Barnet Glasgow Authorized by: Sherlene Shams   ECG reviewed by ED Physician in the absence of a cardiologist: yes   Interpretation:    Interpretation: normal   Rate:    ECG rate:  84   ECG rate assessment: normal   Rhythm:    Rhythm: sinus rhythm   Ectopy:    Ectopy: none   QRS:    QRS axis:  Normal Conduction:    Conduction: normal   ST segments:    ST segments:  Normal T waves:    T waves: normal   Comments:     PR 136 ms QRS 86 ms QT/QTc 390/460 ms ED EKG Date/Time: 03/12/2017 7:09 PM Performed by: Barnet Glasgow Authorized by: Barnet Glasgow   ECG reviewed by ED Physician in the absence of a cardiologist: yes   Previous ECG:    Previous ECG:  Compared to current   Similarity:  No change Interpretation:    Interpretation: normal   Rate:    ECG rate:  67   ECG rate assessment: normal   Rhythm:    Rhythm: sinus rhythm   Ectopy:    Ectopy: none   QRS:    QRS axis:  Normal Conduction:    Conduction: normal   ST segments:    ST segments:  Normal T waves:    T waves: normal   Comments:     PR 136 ms QRS 90 ms QT/QTc 420/443 ms   (including critical care time)  Labs Review Labs Reviewed  HIV ANTIBODY (ROUTINE TESTING)  RPR  URINE CYTOLOGY ANCILLARY ONLY    Imaging Review No results found.    MDM   1. Gastroesophageal reflux disease, esophagitis  presence not specified   2. Screening examination for STD (sexually transmitted disease)     Started on prilosec BID, Zantac QHS. Follow up with PCP, go to ER if chest pain worsens with exertion.  For STD screening, will notify if positive in 3-5 business days.      Barnet Glasgow, NP 03/12/17 1912

## 2017-03-12 NOTE — ED Triage Notes (Signed)
Chest   Pain   And  heavyness      Started  Yesterday    Pt   Also  Wants  To  Be  Checked  For    Std        Pt   Reports    Short  Of  Breath     At  Rest

## 2017-03-12 NOTE — Discharge Instructions (Signed)
Both of your EKGs looked normal, without any appreciable difference. Symptoms are most likely related to gastric reflux, given you a prescription for Prilosec. Take one tablet twice a day for 2 weeks I have also started you on Zantac, take one tablet at night at bedtime. I, recommend following up with your primary care provider if symptoms persist. At any time your symptoms occur with exertion such as climbing stairs, lateral, walking long distances, and I recommend going to the emergency room as soon as possible  With regard to your STD testing, you will be notified if anything is positive in 3-5 business days and given follow up instructions.

## 2017-03-13 LAB — HIV ANTIBODY (ROUTINE TESTING W REFLEX): HIV Screen 4th Generation wRfx: NONREACTIVE

## 2017-03-13 LAB — RPR: RPR Ser Ql: NONREACTIVE

## 2017-03-14 LAB — URINE CYTOLOGY ANCILLARY ONLY
Chlamydia: NEGATIVE
Neisseria Gonorrhea: NEGATIVE
Trichomonas: NEGATIVE

## 2017-04-11 ENCOUNTER — Encounter: Payer: Self-pay | Admitting: Internal Medicine

## 2017-04-11 ENCOUNTER — Ambulatory Visit (INDEPENDENT_AMBULATORY_CARE_PROVIDER_SITE_OTHER): Payer: BLUE CROSS/BLUE SHIELD | Admitting: Internal Medicine

## 2017-04-11 VITALS — BP 144/110 | HR 78 | Temp 98.2°F | Ht 79.0 in | Wt 364.0 lb

## 2017-04-11 DIAGNOSIS — I10 Essential (primary) hypertension: Secondary | ICD-10-CM | POA: Diagnosis not present

## 2017-04-11 DIAGNOSIS — E1169 Type 2 diabetes mellitus with other specified complication: Secondary | ICD-10-CM | POA: Diagnosis not present

## 2017-04-11 DIAGNOSIS — R079 Chest pain, unspecified: Secondary | ICD-10-CM | POA: Diagnosis not present

## 2017-04-11 DIAGNOSIS — E669 Obesity, unspecified: Secondary | ICD-10-CM

## 2017-04-11 DIAGNOSIS — F17200 Nicotine dependence, unspecified, uncomplicated: Secondary | ICD-10-CM | POA: Diagnosis not present

## 2017-04-11 DIAGNOSIS — R7989 Other specified abnormal findings of blood chemistry: Secondary | ICD-10-CM

## 2017-04-11 MED ORDER — SILDENAFIL CITRATE 100 MG PO TABS: 100.0000 mg | ORAL_TABLET | Freq: Every day | ORAL | 1 refills | Status: DC | PRN

## 2017-04-11 NOTE — Patient Instructions (Addendum)
Will call with lab results  Will call with referral and lung function study appts  Continue current medications as ordered  Follow up in 1-2 mos CPE

## 2017-04-11 NOTE — Progress Notes (Signed)
Patient ID: Jonathan Taylor, male   DOB: 1986/10/28, 31 y.o.   MRN: [id number]    Location:  [facility] Place of Service: OFFICE  Chief Complaint  Patient presents with  . Chest Pain    patient states he went to Urgent care with chest pains and wanted referral to cardiologist  . Advanced Directive    discuss advance directives    HPI:  31 yo male seen today for 3 concerns:  1 - Interested in [place] - he was told level was low at [facility] weight loss center. He has a hx low sperm count and would like to conceive. He never returned to weight loss center but states he was going to be rx clomiphene which he has taken in the past. He does have ED and takes viagra prn.  2 - He experienced CP at work on 03/12/17. He went to UC and ECG neg for ischemic changes. GERD sx's improved on prilosec. He never tried zantac. He has never had PFTs. He has a hx asthma. He smokes 1 cig per day.  3 - He underwent STD testing while in the ED as he and his wife participate in a threesome at times. Testing was negative.  DM - followed by Endo Dr Dwyane Dee. A1c 7.5%. He is diet controlled. LDL 84. Urine microalbumin/Cr ratio 0.7.  HTN - diet controlled. He prefers to hold off meds at this time. He has actively changed diet and increased exercise. He has lost 7 lbs in the last month. He is trying to conceive.   Morbid obesity - he is on a diet and exercise program. He is down 3 lbs since last OV. BMI 41.1; wt 364 lb  Past Medical History:  Diagnosis Date  . Blood in stool   . Diabetes mellitus without complication ([place])   . GERD (gastroesophageal reflux disease)   . History of anal fissures   . Low sperm motility     Past Surgical History:  Procedure Laterality Date  . ANAL FISTULECTOMY  2012   with sphhincterotomy  . ANAL FISTULOTOMY N/A 06/29/2015   Procedure: EXCISION PERI RECTAL SCAR/FISTULA, INTERNAL HEMMORRHOIDAL LIGATION, PEXY, MARSUPIALIZATION;  Surgeon: Michael Boston, MD;  Location: [facility];  Service:  General;  Laterality: N/A;  . EXAMINATION UNDER ANESTHESIA N/A 06/29/2015   Procedure: EXAM UNDER ANESTHESIA;  Surgeon: Michael Boston, MD;  Location: [facility];  Service: General;  Laterality: N/A;  . RECTAL SURGERY  08/2010   Dr Zenia Resides    Patient Care Team: Gildardo Cranker, DO as PCP - General (Internal Medicine)  Social History   Social History  . Marital status: Married    Spouse name: N/A  . Number of children: 0  . Years of education: N/A   Occupational History  . Glass blower/designer; QC specialist    Social History Main Topics  . Smoking status: Former Smoker    Packs/day: 0.50    Years: 10.00    Types: Cigarettes    Quit date: 05/26/2015  . Smokeless tobacco: Never Used     Comment: Pt given handout  . Alcohol use Yes     Comment: Occassionally  . Drug use: No  . Sexual activity: Yes   Other Topics Concern  . Not on file   Social History Narrative   Diet? Low carb meat/veggies water      Do you drink/eat things with caffeine? sometimes      Marital status?        married  What year were you married? 2015      Do you live in a house, apartment, assisted living, condo, trailer, etc.? townhouse      Is it one or more stories? 2 story      How many persons live in your home? 3      Do you have any pets in your home? (please list) yes 1 dog      Current or past profession: forklift/machine operator      Do you exercise?       Only at work                               Type & how often? daily      Do you have a living will? n/a      Do you have a DNR form? n/a                                 If not, do you want to discuss one?      Do you have signed POA/HPOA for forms?  n/a        reports that he quit smoking about 22 months ago. His smoking use included Cigarettes. He has a 5.00 pack-year smoking history. He has never used smokeless tobacco. He reports that he drinks alcohol. He reports that he does not use drugs.  Family History    Problem Relation Age of Onset  . Diabetes Mother   . Hypertension Mother   . Throat cancer Maternal Aunt   . Colon cancer Neg Hx   . Colon polyps Neg Hx   . Kidney disease Neg Hx    Family Status  Relation Status  . Mother Alive  . Father Alive  . Mat Aunt (Not Specified)  . Daughter Alive  . Neg Hx (Not Specified)     Allergies  Allergen Reactions  . Tomato Anaphylaxis  . Mushroom Extract Complex Swelling    Lips  . Shrimp [Shellfish Allergy]     Medications: Patient's Medications  New Prescriptions   No medications on file  Previous Medications   GLUCOSE BLOOD (BAYER CONTOUR TEST) TEST STRIP    Use to test blood sugar once daily   NAPROXEN (NAPROSYN) 500 MG TABLET    Take 1 tablet (500 mg total) by mouth 2 (two) times daily with a meal.   OMEPRAZOLE (PRILOSEC) 40 MG CAPSULE    Take 1 capsule (40 mg total) by mouth 2 (two) times daily.   RANITIDINE (ZANTAC) 300 MG TABLET    Take 1 tablet (300 mg total) by mouth at bedtime.   SILDENAFIL (VIAGRA) 100 MG TABLET    Take 1 tablet (100 mg total) by mouth daily as needed for erectile dysfunction.  Modified Medications   No medications on file  Discontinued Medications   No medications on file    Review of Systems  Cardiovascular: Positive for chest pain.  All other systems reviewed and are negative.   Vitals:   04/11/17 1142  Pulse: 78  Temp: 98.2 F (36.8 C)  TempSrc: Oral  SpO2: 98%  Weight: (!) 364 lb (165.1 kg)  Height: 6\' 7"  (2.007 m)   Body mass index is 41.01 kg/m.  Physical Exam  Constitutional: He is oriented to person, place, and time. He appears well-developed and well-nourished.  Looks tired in NAD  HENT:  Mouth/Throat: Oropharynx is clear and moist.  Eyes: Pupils are equal, round, and reactive to light. No scleral icterus.  Neck: Neck supple. Carotid bruit is not present. No thyromegaly present.  Cardiovascular: Normal rate, regular rhythm, normal heart sounds and intact distal pulses.  Exam  reveals no gallop and no friction rub.   No murmur heard. no distal LE swelling. No calf TTP  Pulmonary/Chest: Effort normal and breath sounds normal. He has no wheezes. He has no rales. He exhibits no tenderness.  Abdominal: Soft. Bowel sounds are normal. He exhibits no distension, no abdominal bruit, no pulsatile midline mass and no mass. There is no hepatomegaly. There is no tenderness. There is no rebound and no guarding.  obese  Lymphadenopathy:    He has no cervical adenopathy.  Neurological: He is alert and oriented to person, place, and time. He has normal reflexes.  Skin: Skin is warm and dry. No rash noted.  Multiple tattoos  Psychiatric: He has a normal mood and affect. His behavior is normal. Judgment and thought content normal.     Labs reviewed: Admission on 03/12/2017, Discharged on 03/12/2017  Component Date Value Ref Range Status  . Chlamydia 03/12/2017 Negative   Final   Normal Reference Range - Negative  . Neisseria gonorrhea 03/12/2017 Negative   Final   Normal Reference Range - Negative  . Trichomonas 03/12/2017 Negative   Final   Normal Reference Range - Negative  . HIV Screen 4th Generation wRfx 03/12/2017 Non Reactive  Non Reactive Final   Comment: (NOTE) Performed At: [facility] [place], [place] [id number] Lindon Romp MD IW:[id number]   . RPR Ser Ql 03/12/2017 Non Reactive  Non Reactive Final   Comment: (NOTE) Performed At: [facility] [place], [place] [id number] Lindon Romp MD AL:[id number]   Lab on 01/12/2017  Component Date Value Ref Range Status  . Hgb A1c MFr Bld 01/12/2017 7.5* 4.6 - 6.5 % Final   Glycemic Control Guidelines for People with Diabetes:Non Diabetic:  <6%Goal of Therapy: <7%Additional Action Suggested:  >8%   . Sodium 01/12/2017 138  135 - 145 mEq/L Final  . Potassium 01/12/2017 3.9  3.5 - 5.1 mEq/L Final  . Chloride 01/12/2017 107  96 - 112 mEq/L Final  . CO2 01/12/2017  25  19 - 32 mEq/L Final  . Glucose, Bld 01/12/2017 136* 70 - 99 mg/dL Final  . BUN 01/12/2017 13  6 - 23 mg/dL Final  . Creatinine, Ser 01/12/2017 0.89  0.40 - 1.50 mg/dL Final  . Calcium 01/12/2017 9.6  8.4 - 10.5 mg/dL Final  . GFR 01/12/2017 128.57  >60.00 mL/min Final    No results found.   Assessment/Plan   ICD-10-CM   1. Low testosterone in male R79.89 Testosterone, free    Testosterone, Total, LC/MS/MS  2. Chest pain, unspecified type R07.9 Ambulatory referral to Cardiology    Pulmonary function test  3. Diabetes mellitus type 2 in obese [facility]) E11.69 Ambulatory referral to Cardiology   E66.9   4. Essential hypertension I10 Ambulatory referral to Cardiology  5. Current smoker F17.200 Pulmonary function test   Will call with lab results  Will call with referral and lung function study appts  Continue current medications as ordered  Complete smoking cessation discussed and highly urged.   Follow up in 1-2 mos [place]. Perlie Gold  [facility] [place], [place]  [address] [phone] Cell (Monday-Friday 8 AM - 5 PM) [phone] After 5 PM and follow prompts   

## 2017-04-14 LAB — TESTOSTERONE, TOTAL, LC/MS/MS: Testosterone, Total, LC-MS-MS: 286 ng/dL (ref 250–1100)

## 2017-04-16 LAB — TESTOSTERONE, FREE AND TOTAL (INCLUDES SHBG)-(MALES)
Sex Hormone Binding: 23 nmol/L (ref 10–50)
Testosterone, Free: 59.5 pg/mL (ref 47.0–244.0)
Testosterone-% Free: 2.4 % (ref 1.6–2.9)
Testosterone: 253 ng/dL (ref 250–827)

## 2017-04-26 ENCOUNTER — Ambulatory Visit (INDEPENDENT_AMBULATORY_CARE_PROVIDER_SITE_OTHER): Payer: BLUE CROSS/BLUE SHIELD | Admitting: Internal Medicine

## 2017-04-26 DIAGNOSIS — F17200 Nicotine dependence, unspecified, uncomplicated: Secondary | ICD-10-CM

## 2017-04-26 DIAGNOSIS — R079 Chest pain, unspecified: Secondary | ICD-10-CM | POA: Diagnosis not present

## 2017-04-26 LAB — PULMONARY FUNCTION TEST
DL/VA % pred: 84 %
DL/VA: 4.28 ml/min/mmHg/L
DLCO cor % pred: 76 %
DLCO cor: 33.52 ml/min/mmHg
DLCO unc % pred: 77 %
DLCO unc: 33.89 ml/min/mmHg
FEF 25-75 Post: 4.01 L/sec
FEF 25-75 Pre: 3.11 L/sec
FEF2575-%Change-Post: 28 %
FEF2575-%Pred-Post: 80 %
FEF2575-%Pred-Pre: 62 %
FEV1-%Change-Post: 8 %
FEV1-%Pred-Post: 90 %
FEV1-%Pred-Pre: 84 %
FEV1-Post: 4.43 L
FEV1-Pre: 4.09 L
FEV1FVC-%Change-Post: 6 %
FEV1FVC-%Pred-Pre: 87 %
FEV6-%Change-Post: 1 %
FEV6-%Pred-Post: 97 %
FEV6-%Pred-Pre: 95 %
FEV6-Post: 5.65 L
FEV6-Pre: 5.56 L
FEV6FVC-%Change-Post: 0 %
FEV6FVC-%Pred-Post: 101 %
FEV6FVC-%Pred-Pre: 100 %
FVC-%Change-Post: 1 %
FVC-%Pred-Post: 95 %
FVC-%Pred-Pre: 94 %
FVC-Post: 5.65 L
FVC-Pre: 5.57 L
Post FEV1/FVC ratio: 78 %
Post FEV6/FVC ratio: 100 %
Pre FEV1/FVC ratio: 74 %
Pre FEV6/FVC Ratio: 100 %

## 2017-04-26 NOTE — Progress Notes (Signed)
PFT done today. 

## 2017-04-27 ENCOUNTER — Ambulatory Visit (INDEPENDENT_AMBULATORY_CARE_PROVIDER_SITE_OTHER): Payer: BLUE CROSS/BLUE SHIELD | Admitting: Cardiology

## 2017-04-27 ENCOUNTER — Encounter: Payer: Self-pay | Admitting: Cardiology

## 2017-04-27 VITALS — BP 148/110 | Ht 78.5 in | Wt 365.0 lb

## 2017-04-27 DIAGNOSIS — E1169 Type 2 diabetes mellitus with other specified complication: Secondary | ICD-10-CM | POA: Diagnosis not present

## 2017-04-27 DIAGNOSIS — I10 Essential (primary) hypertension: Secondary | ICD-10-CM

## 2017-04-27 DIAGNOSIS — E669 Obesity, unspecified: Secondary | ICD-10-CM

## 2017-04-27 DIAGNOSIS — R079 Chest pain, unspecified: Secondary | ICD-10-CM

## 2017-04-27 NOTE — Patient Instructions (Addendum)
Schedule at [place] has requested that you have an exercise tolerance test. For further information please visit [email or web address]. Please also follow instruction sheet, as given.   NO MEDICATION CHANGES   Your physician recommends that you schedule a follow-up appointment in [place].

## 2017-04-27 NOTE — Progress Notes (Signed)
PCP: Gildardo Cranker, DO  Clinic Note: Chief Complaint  Patient presents with  . New Patient (Initial Visit)    having some chest pain, stressful job and elevated blood presssure     HPI: Jonathan Taylor is a 31 y.o. male who is being seen today for the evaluation of chest pain at the request of Gildardo Cranker, DO.  Olga Seyler was seen on 04/11/2017 by Gildardo Cranker, D.O. follow-up from his ER visit.  Recent Hospitalizations: ER visit in 03/12/2017 with 24-hour episode of chest pain radiating down left arm. Not related to exertion. Symptoms occurred at rest. Sleeps with 3 pillows. Chest pain described as a burning fullness in his throat heaviness in his chest.  Studies Personally Reviewed - (if available, images/films reviewed: From Epic Chart or Care Everywhere)  ER EKG shows sinus rhythm 67 beats minute with sinus arrhythmia and nonspecific ST and T-wave changes. No signs of ischemia.  Interval History: Jonathan Taylor presents for evaluation of intermittent episodes of sharp l sided CP that "takes his breath away".  The first event occurred while at work - midway through a shift.  He had to go sit down, and felt better.  However he had more symptoms as the shift went on.  He notes that work is very stressful - they end up having to do overtime work if they don't make a certain quota during regular hours.  The pain was not really exacerbated by physical exertion, but occurred more with "emotional stress".  He did note that the pain in his chest would radiate down the Left arm.  He denies having any other symptoms since that shift back on May 7th.   He sleeps on 3 pillows - more for comfort, but really denies PND or orthopnea & edema.  He used to be a 1 - 1.5 PPD smoker, but now only smokes at work - 1 or 2 per shift.   He has a diagnosis of DM-2, but is not yet on medications.  No PND, orthopnea or edema. No palpitations, lightheadedness, dizziness, weakness or syncope/near syncope. No  TIA/amaurosis fugax symptoms. No melena, hematochezia, hematuria, or epstaxis. No claudication.  ROS: A comprehensive was performed. Review of Systems  Constitutional: Negative for malaise/fatigue.  HENT: Negative for congestion and nosebleeds.   Respiratory: Negative for cough, shortness of breath and wheezing.   Gastrointestinal: Negative for abdominal pain, blood in stool, heartburn and melena.  Genitourinary: Negative for hematuria.  Musculoskeletal: Negative for joint pain and myalgias.  Neurological: Negative for dizziness and focal weakness.  Psychiatric/Behavioral: Negative for depression and memory loss. The patient is not nervous/anxious and does not have insomnia.        Really only notes stress with work - "always under pressure"  All other systems reviewed and are negative.   I have reviewed and (if needed) personally updated the patient's problem list, medications, allergies, past medical and surgical history, social and family history.   Past Medical History:  Diagnosis Date  . Blood in stool   . Diabetes mellitus without complication ([place])   . GERD (gastroesophageal reflux disease)   . History of anal fissures   . Low sperm motility     Past Surgical History:  Procedure Laterality Date  . ANAL FISTULECTOMY  2012   with sphhincterotomy  . ANAL FISTULOTOMY N/A 06/29/2015   Procedure: EXCISION PERI RECTAL SCAR/FISTULA, INTERNAL HEMMORRHOIDAL LIGATION, PEXY, MARSUPIALIZATION;  Surgeon: Michael Boston, MD;  Location: [facility];  Service: General;  Laterality: N/A;  .  EXAMINATION UNDER ANESTHESIA N/A 06/29/2015   Procedure: EXAM UNDER ANESTHESIA;  Surgeon: Michael Boston, MD;  Location: [facility];  Service: General;  Laterality: N/A;  . RECTAL SURGERY  08/2010   Dr Jonathan Taylor    Current Meds  Medication Sig  . glucose blood (BAYER CONTOUR TEST) test strip Use to test blood sugar once daily  . naproxen (NAPROSYN) 500 MG tablet Take 1 tablet (500 mg total) by mouth 2 (two) times  daily with a meal.  . omeprazole (PRILOSEC) 40 MG capsule Take 1 capsule (40 mg total) by mouth 2 (two) times daily.  . ranitidine (ZANTAC) 300 MG tablet Take 1 tablet (300 mg total) by mouth at bedtime.  . sildenafil (VIAGRA) 100 MG tablet Take 1 tablet (100 mg total) by mouth daily as needed for erectile dysfunction.    Allergies  Allergen Reactions  . Mushroom Extract Complex Swelling    Lips  . Shrimp [Shellfish Allergy] Swelling  . Tomato Anaphylaxis    Social History   Social History  . Marital status: Married    Spouse name: N/A  . Number of children: 0  . Years of education: N/A   Occupational History  . Glass blower/designer; QC specialist    Social History Main Topics  . Smoking status: Former Smoker    Packs/day: 0.50    Years: 10.00    Types: Cigarettes    Quit date: 05/26/2015  . Smokeless tobacco: Never Used     Comment: Pt given handout  . Alcohol use Yes     Comment: Occassionally  . Drug use: No  . Sexual activity: Yes   Other Topics Concern  . None   Social History Narrative   Diet? Low carb meat/veggies water      Do you drink/eat things with caffeine? sometimes      Marital status?        married                            What year were you married? 2015      Do you live in a house, apartment, assisted living, condo, trailer, etc.? townhouse      Is it one or more stories? 2 story      How many persons live in your home? 3      Do you have any pets in your home? (please list) yes 1 dog      Current or past profession: forklift/machine operator      Do you exercise?       Only at work                               Type & how often? daily      Do you have a living will? n/a      Do you have a DNR form? n/a                                 If not, do you want to discuss one?      Do you have signed POA/HPOA for forms?  n/a     10 year smoking history denies recreational drug   family history includes Cirrhosis in his maternal  grandfather; Diabetes in his mother; Hypertension in his mother; Throat cancer in his maternal aunt.  Wt Readings  from Last 3 Encounters:  04/27/17 (!) 365 lb (165.6 kg)  04/11/17 (!) 364 lb (165.1 kg)  11/29/16 (!) 367 lb (166.5 kg)    PHYSICAL EXAM BP (!) 148/110   Ht 6' 6.5" (1.994 m)   Wt (!) 365 lb (165.6 kg)   BMI 41.64 kg/m  General appearance: alert, cooperative, appears stated age, no distress. morbidly obese; well groomed. HEENT: [place]/AT, EOMI, MMM, anicteric sclera Neck: no adenopathy, no carotid bruit and no JVD Lungs: clear to auscultation bilaterally, normal percussion bilaterally and non-labored Heart: regular rate and rhythm, S1 &S2 normal, no murmur, click, rub or gallop; unable to palpate PMI Abdomen: soft, non-tender; bowel sounds normal; no masses,  no organomegaly; no HJR Extremities: extremities normal, atraumatic, no cyanosis, or edema Pulses: 2+ and symmetric;  Skin: normal, mobility and turgor normal, no evidence of bleeding or bruising and no lesions noted or  Neurologic: Mental status: Alert & oriented x 3, thought content appropriate; non-focal exam.  Pleasant mood & affect. Cranial nerves: normal (II-XII grossly intact)    Adult ECG Report  n/a  Other studies Reviewed: Additional studies/ records that were reviewed today include:  Recent Labs:  N/a   ASSESSMENT / PLAN: Problem List Items Addressed This Visit    Chest pain with low risk for cardiac etiology - Primary    He had one episode that was somewhat concerning for possible anginal symptoms, but he has not had any further symptoms. No further chest tightness or pressure with exertion to suggest that this was an anginal equivalent.  Given his young age and essentially metabolic syndrome (obesity, hypertension and borderline diabetes) we will evaluate with a GXT. This is abnormal with an proceed with a more definitive study either stress Myoview or CTA with FFR.      Relevant Orders   EXERCISE  TOLERANCE TEST   Diabetes mellitus type 2 in obese (HCC) (Chronic)    Was originally on metformin as of last November, but now is no longer on any medical management. --  Lab Results  Component Value Date   HGBA1C 7.5 (H) 01/12/2017   With HTN & Obesity - > metabolic Syndrome --> would restart Metformin.      Relevant Orders   EXERCISE TOLERANCE TEST   Essential hypertension (Chronic)    Elevated blood pressure today. But he does indicate he is exhausted and has had a couple cups of coffee in order to stay awake this appointment. Reassess when I see him back and will see his blood pressure response to exercise. Low threshold to initiate therapy      Relevant Orders   EXERCISE TOLERANCE TEST   Obesity, morbid, BMI 40.0-49.9 (HCC) (Chronic)    The patient understands the need to lose weight with diet and exercise. We have discussed specific strategies for this.      Relevant Orders   EXERCISE TOLERANCE TEST      Current medicines are reviewed at length with the patient today. (+/- concerns) n/a The following changes have been made: n/a  Patient Instructions  Schedule at [facility] has requested that you have an exercise tolerance test. For further information please visit [email or web address]. Please also follow instruction sheet, as given.   NO MEDICATION CHANGES   Your physician recommends that you schedule a follow-up appointment in [place].   Studies Ordered:   Orders Placed This Encounter  Procedures  . EXERCISE TOLERANCE TEST      Shanon Brow  Ellyn Hack, M.D., M.S. Interventional Cardiologist   Pager # [phone] Phone # [phone] [address] [place], [place] [address]

## 2017-04-29 ENCOUNTER — Encounter: Payer: Self-pay | Admitting: Cardiology

## 2017-04-29 DIAGNOSIS — R079 Chest pain, unspecified: Secondary | ICD-10-CM | POA: Insufficient documentation

## 2017-04-29 NOTE — Assessment & Plan Note (Signed)
He had one episode that was somewhat concerning for possible anginal symptoms, but he has not had any further symptoms. No further chest tightness or pressure with exertion to suggest that this was an anginal equivalent.  Given his young age and essentially metabolic syndrome (obesity, hypertension and borderline diabetes) we will evaluate with a GXT. This is abnormal with an proceed with a more definitive study either stress Myoview or CTA with FFR.

## 2017-04-29 NOTE — Assessment & Plan Note (Signed)
Elevated blood pressure today. But he does indicate he is exhausted and has had a couple cups of coffee in order to stay awake this appointment. Reassess when I see him back and will see his blood pressure response to exercise. Low threshold to initiate therapy

## 2017-04-29 NOTE — Assessment & Plan Note (Signed)
The patient understands the need to lose weight with diet and exercise. We have discussed specific strategies for this.  

## 2017-04-29 NOTE — Assessment & Plan Note (Signed)
Was originally on metformin as of last November, but now is no longer on any medical management. --  Lab Results  Component Value Date   HGBA1C 7.5 (H) 01/12/2017   With HTN & Obesity - > metabolic Syndrome --> would restart Metformin.

## 2017-04-30 ENCOUNTER — Other Ambulatory Visit: Payer: Self-pay | Admitting: *Deleted

## 2017-04-30 DIAGNOSIS — R942 Abnormal results of pulmonary function studies: Secondary | ICD-10-CM

## 2017-04-30 DIAGNOSIS — R0600 Dyspnea, unspecified: Secondary | ICD-10-CM

## 2017-04-30 NOTE — Progress Notes (Signed)
Per Dr. Eulas Post  Order placed due to abnormal PFT

## 2017-05-03 ENCOUNTER — Ambulatory Visit
Admission: RE | Admit: 2017-05-03 | Discharge: 2017-05-03 | Disposition: A | Discharge status: Home / Self Care | Payer: BLUE CROSS/BLUE SHIELD | Source: Ambulatory Visit | Attending: Internal Medicine | Admitting: Internal Medicine

## 2017-05-03 DIAGNOSIS — R0600 Dyspnea, unspecified: Secondary | ICD-10-CM

## 2017-05-03 DIAGNOSIS — R942 Abnormal results of pulmonary function studies: Secondary | ICD-10-CM

## 2017-05-04 ENCOUNTER — Other Ambulatory Visit: Payer: Self-pay

## 2017-05-04 MED ORDER — FLUTICASONE-SALMETEROL 250-50 MCG/DOSE IN AEPB: 1.0000 | INHALATION_SPRAY | Freq: Two times a day (BID) | RESPIRATORY_TRACT | 3 refills | Status: DC

## 2017-05-16 ENCOUNTER — Telehealth (HOSPITAL_COMMUNITY): Payer: Self-pay

## 2017-05-16 NOTE — Telephone Encounter (Signed)
Encounter complete. 

## 2017-05-17 ENCOUNTER — Ambulatory Visit (HOSPITAL_COMMUNITY)
Admission: RE | Admit: 2017-05-17 | Discharge: 2017-05-17 | Disposition: A | Discharge status: Home / Self Care | Payer: BLUE CROSS/BLUE SHIELD | Source: Ambulatory Visit | Attending: Cardiovascular Disease | Admitting: Cardiovascular Disease

## 2017-05-17 DIAGNOSIS — R079 Chest pain, unspecified: Secondary | ICD-10-CM

## 2017-05-17 DIAGNOSIS — E669 Obesity, unspecified: Secondary | ICD-10-CM | POA: Diagnosis not present

## 2017-05-17 DIAGNOSIS — E1169 Type 2 diabetes mellitus with other specified complication: Secondary | ICD-10-CM | POA: Diagnosis not present

## 2017-05-17 DIAGNOSIS — I10 Essential (primary) hypertension: Secondary | ICD-10-CM

## 2017-05-17 DIAGNOSIS — E119 Type 2 diabetes mellitus without complications: Secondary | ICD-10-CM | POA: Insufficient documentation

## 2017-05-17 LAB — EXERCISE TOLERANCE TEST
Estimated workload: 7.2 METS
Exercise duration (min): 5 min
Exercise duration (sec): 34 s
MPHR: 190 {beats}/min
Peak HR: 162 {beats}/min
Percent HR: 85 %
RPE: 19
Rest HR: 67 {beats}/min

## 2017-06-08 ENCOUNTER — Ambulatory Visit: Payer: BLUE CROSS/BLUE SHIELD | Admitting: Cardiology

## 2017-07-08 ENCOUNTER — Encounter (HOSPITAL_COMMUNITY): Payer: Self-pay | Admitting: Emergency Medicine

## 2017-07-08 ENCOUNTER — Ambulatory Visit (HOSPITAL_COMMUNITY)
Admission: EM | Admit: 2017-07-08 | Discharge: 2017-07-08 | Disposition: A | Discharge status: Home / Self Care | Payer: BLUE CROSS/BLUE SHIELD | Attending: Family Medicine | Admitting: Family Medicine

## 2017-07-08 DIAGNOSIS — Z87891 Personal history of nicotine dependence: Secondary | ICD-10-CM | POA: Insufficient documentation

## 2017-07-08 DIAGNOSIS — Z202 Contact with and (suspected) exposure to infections with a predominantly sexual mode of transmission: Secondary | ICD-10-CM | POA: Insufficient documentation

## 2017-07-08 DIAGNOSIS — J029 Acute pharyngitis, unspecified: Secondary | ICD-10-CM | POA: Diagnosis not present

## 2017-07-08 DIAGNOSIS — N529 Male erectile dysfunction, unspecified: Secondary | ICD-10-CM | POA: Insufficient documentation

## 2017-07-08 DIAGNOSIS — H6982 Other specified disorders of Eustachian tube, left ear: Secondary | ICD-10-CM

## 2017-07-08 DIAGNOSIS — Z91013 Allergy to seafood: Secondary | ICD-10-CM | POA: Insufficient documentation

## 2017-07-08 LAB — POCT RAPID STREP A: Streptococcus, Group A Screen (Direct): NEGATIVE

## 2017-07-08 MED ORDER — FLUTICASONE PROPIONATE 50 MCG/ACT NA SUSP: 2.0000 | Freq: Every day | NASAL | 0 refills | Status: DC

## 2017-07-08 NOTE — ED Triage Notes (Signed)
Pt here for cold sx onset 4 days associated w/ST, nasal congestion/drainage, prod cough, left ear pain  Also wants to be checked for STD... Asymptomatic.   Sexually active... sts condom broke during last SI.   A&O x4... NAD... Ambulatory

## 2017-07-08 NOTE — ED Notes (Signed)
Call back number verified and updated in EPIC... Adv pt to not have SI until lab results comeback neg.... Also adv pt lab results will be on MyChart; instructions given .... Pt verb understanding.

## 2017-07-08 NOTE — ED Provider Notes (Signed)
[facility]    CSN: [id number] Arrival date & time: 07/08/17  1201     History   Chief Complaint Chief Complaint  Patient presents with  . URI  . Exposure to STD    HPI Jonathan Taylor is a 31 y.o. male.   HPI Duration: 3 days  Associated symptoms: sore throat and cold sores Denies: ear drainage, sinus pain or congestion, itchy/watery eyes, SOB, fevers/rigors Treatment to date: Theraflu Sick contacts: Yes- ppl at work He has been in front of air cond vents as of late and needs to clean them.   He also had a sexual encounter with another male, condom broke but he stopped. He did perform oral sex on her. She has no concerns. His wife is not concerned with any STI symptoms either, though she is unaware of his infidelity. He denies fevers, abd pain, urinary complaints, discharge, or skin lesions.  Past Medical History:  Diagnosis Date  . Blood in stool   . Diabetes mellitus without complication ([place])   . GERD (gastroesophageal reflux disease)   . History of anal fissures   . Low sperm motility     Patient Active Problem List   Diagnosis Date Noted  . Chest pain with low risk for cardiac etiology 04/29/2017  . Erectile dysfunction associated with type 2 diabetes mellitus ([place]) 10/02/2016  . Essential hypertension 10/02/2016  . Panic attacks 12/02/2015  . Sleep disturbance 10/07/2015  . Possible exposure to STD 10/07/2015  . Diabetes mellitus type 2 in obese ([place]) 03/14/2015  . Male fertility problem 03/14/2015  . Obesity, morbid, BMI 40.0-49.9 ([place]) 11/17/2014  . Anal fistula 09/12/2011    Past Surgical History:  Procedure Laterality Date  . ANAL FISTULECTOMY  2012   with sphhincterotomy  . ANAL FISTULOTOMY N/A 06/29/2015   Procedure: EXCISION PERI RECTAL SCAR/FISTULA, INTERNAL HEMMORRHOIDAL LIGATION, PEXY, MARSUPIALIZATION;  Surgeon: Michael Boston, MD;  Location: [facility];  Service: General;  Laterality: N/A;  . EXAMINATION UNDER ANESTHESIA N/A 06/29/2015     Procedure: EXAM UNDER ANESTHESIA;  Surgeon: Michael Boston, MD;  Location: [facility];  Service: General;  Laterality: N/A;  . RECTAL SURGERY  08/2010   Dr Zenia Resides       Home Medications    Prior to Admission medications   Medication Sig Start Date End Date Taking? Authorizing Provider  fluticasone (FLONASE) 50 MCG/ACT nasal spray Place 2 sprays into both nostrils daily. 07/08/17   Shelda Pal, DO  Fluticasone-Salmeterol (ADVAIR DISKUS) 250-50 MCG/DOSE AEPB Inhale 1 puff into the lungs 2 (two) times daily. 05/04/17   Gildardo Cranker, DO  glucose blood (BAYER CONTOUR TEST) test strip Use to test blood sugar once daily 11/16/16   Elayne Snare, MD  naproxen (NAPROSYN) 500 MG tablet Take 1 tablet (500 mg total) by mouth 2 (two) times daily with a meal. 06/29/15   Michael Boston, MD  omeprazole (PRILOSEC) 40 MG capsule Take 1 capsule (40 mg total) by mouth 2 (two) times daily. 03/12/17 04/27/17  Barnet Glasgow, NP  ranitidine (ZANTAC) 300 MG tablet Take 1 tablet (300 mg total) by mouth at bedtime. 03/12/17   Barnet Glasgow, NP  sildenafil (VIAGRA) 100 MG tablet Take 1 tablet (100 mg total) by mouth daily as needed for erectile dysfunction. 04/11/17   Gildardo Cranker, DO    Family History Family History  Problem Relation Age of Onset  . Diabetes Mother   . Hypertension Mother   . Throat cancer Maternal Aunt   . Cirrhosis Maternal  Grandfather   . Colon cancer Neg Hx   . Colon polyps Neg Hx   . Kidney disease Neg Hx     Social History Social History  Substance Use Topics  . Smoking status: Former Smoker    Packs/day: 0.50    Years: 10.00    Types: Cigarettes    Quit date: 05/26/2015  . Smokeless tobacco: Never Used     Comment: Pt given handout  . Alcohol use Yes     Comment: Occassionally     Allergies   Mushroom extract complex; Shrimp [shellfish allergy]; and Tomato   Review of Systems Review of Systems  Constitutional: Negative for fever.  HENT: Positive for sore  throat.      Physical Exam Updated Vital Signs BP 139/80 (BP Location: Right Arm)   Pulse 94   Temp 98.8 F (37.1 C) (Oral)   Resp 20   SpO2 99%   Physical Exam  Constitutional: He appears well-developed and well-nourished.  HENT:  Head: Normocephalic and atraumatic.  Right Ear: External ear normal.  Left Ear: External ear normal.  Nose: Nose normal.  Mouth/Throat: Oropharyngeal exudate present.  Eyes: Pupils are equal, round, and reactive to light. EOM are normal.  Neck: Normal range of motion. Neck supple.  Cardiovascular: Normal rate and regular rhythm.   Pulmonary/Chest: Effort normal and breath sounds normal. No respiratory distress.  Lymphadenopathy:    He has no cervical adenopathy.  Skin:  No inguinal adenopathy  Psychiatric: He has a normal mood and affect. Judgment normal.     UC Treatments / Results  Labs (all labs ordered are listed, but only abnormal results are displayed) Labs Reviewed  HIV ANTIBODY (ROUTINE TESTING)  POCT RAPID STREP A  URINE CYTOLOGY ANCILLARY ONLY   Procedures Procedures - none   Initial Impression / Assessment and Plan / UC Course  I have reviewed the triage vital signs and the nursing notes.  Pertinent labs & imaging results that were available during my care of the patient were reviewed by me and considered in my medical decision making (see chart for details).     30 yo BM presents with URI complaints without suggestive of bacterial infection and new sexual partner. He is asymptomatic for STI. Will obtain HIV and cytologies and let him know if they are positive. Tx symptomatically given neg strep and 1/4 Cent[facility]. INCS called in, OTC recs also given. F/u with PCP if symptoms worsen or fail to improve. The patient voiced understanding and agreement to the plan.    Final Clinical Impressions(s) / UC Diagnoses   Final diagnoses:  Viral pharyngitis  Dysfunction of left eustachian tube  Possible exposure to STD    New  Prescriptions New Prescriptions   FLUTICASONE (FLONASE) 50 MCG/ACT NASAL SPRAY    Place 2 sprays into both nostrils daily.     Controlled Substance Prescriptions [place] Controlled Substance Registry consulted? Not Applicable   Shelda Pal, DO 07/08/17 1255

## 2017-07-08 NOTE — Discharge Instructions (Signed)
Continue to push fluids, practice good hand hygiene, and cover your mouth if you cough.  If you start having fevers, shaking or shortness of breath, seek immediate care.  Ibuprofen 400-600 mg (2-3 over the counter strength tabs) every 6 hours as needed for pain.  OK to take Tylenol 1000 mg (2 extra strength tabs) or 975 mg (3 regular strength tabs) every 6 hours as needed.  Claritin (loratadine), Allegra (fexofenadine), Zyrtec (cetirizine); these are listed in order from weakest to strongest. Generic, and therefore cheaper, options are in the parentheses.   Flonase (fluticasone); nasal spray that is over the counter. 2 sprays each nostril, once daily. Aim towards the same side eye when you spray.  There are available OTC, and the generic versions, which may be cheaper, are in parentheses. Show this to a pharmacist if you have trouble finding any of these items.

## 2017-07-10 ENCOUNTER — Encounter: Payer: Self-pay | Admitting: Internal Medicine

## 2017-07-10 ENCOUNTER — Ambulatory Visit (INDEPENDENT_AMBULATORY_CARE_PROVIDER_SITE_OTHER): Payer: BLUE CROSS/BLUE SHIELD | Admitting: Internal Medicine

## 2017-07-10 VITALS — BP 132/82 | HR 72 | Temp 99.1°F | Resp 18 | Ht 79.0 in | Wt 368.6 lb

## 2017-07-10 DIAGNOSIS — R0683 Snoring: Secondary | ICD-10-CM | POA: Diagnosis not present

## 2017-07-10 DIAGNOSIS — R7989 Other specified abnormal findings of blood chemistry: Secondary | ICD-10-CM | POA: Diagnosis not present

## 2017-07-10 DIAGNOSIS — H66002 Acute suppurative otitis media without spontaneous rupture of ear drum, left ear: Secondary | ICD-10-CM | POA: Diagnosis not present

## 2017-07-10 DIAGNOSIS — I10 Essential (primary) hypertension: Secondary | ICD-10-CM | POA: Diagnosis not present

## 2017-07-10 DIAGNOSIS — E669 Obesity, unspecified: Secondary | ICD-10-CM

## 2017-07-10 DIAGNOSIS — E1169 Type 2 diabetes mellitus with other specified complication: Secondary | ICD-10-CM

## 2017-07-10 DIAGNOSIS — Z0000 Encounter for general adult medical examination without abnormal findings: Secondary | ICD-10-CM | POA: Diagnosis not present

## 2017-07-10 LAB — HIV ANTIBODY (ROUTINE TESTING W REFLEX): HIV Screen 4th Generation wRfx: NONREACTIVE

## 2017-07-10 LAB — CULTURE, GROUP A STREP (THRC)

## 2017-07-10 MED ORDER — FLUTICASONE-SALMETEROL 250-50 MCG/DOSE IN AEPB: 1.0000 | INHALATION_SPRAY | Freq: Two times a day (BID) | RESPIRATORY_TRACT | 3 refills | Status: DC

## 2017-07-10 MED ORDER — AMOXICILLIN-POT CLAVULANATE 875-125 MG PO TABS: 1.0000 | ORAL_TABLET | Freq: Two times a day (BID) | ORAL | 0 refills | Status: DC

## 2017-07-10 MED ORDER — SILDENAFIL CITRATE 100 MG PO TABS: 100.0000 mg | ORAL_TABLET | Freq: Every day | ORAL | 1 refills | Status: DC | PRN

## 2017-07-10 MED ORDER — CEFTRIAXONE SODIUM 1 G IJ SOLR
1.0000 g | INTRAMUSCULAR | Status: DC
  Administered 2017-07-10: 1 g via INTRAMUSCULAR

## 2017-07-10 MED ORDER — IBUPROFEN 800 MG PO TABS: 800.0000 mg | ORAL_TABLET | Freq: Three times a day (TID) | ORAL | 1 refills | Status: DC | PRN

## 2017-07-10 NOTE — Progress Notes (Signed)
Patient ID: Jonathan Taylor, male   DOB: September 19, 1986, 31 y.o.   MRN: [id number]   Location:  [facility]  Place of Service:  OFFICE  Provider: Arletha Grippe, DO  Patient Care Team: Gildardo Cranker, DO as PCP - General (Internal Medicine)  Extended Emergency Contact Information Primary Emergency Contact: [place]wood Address: [place], [place] [address] [name] Phone: [phone] Work Phone: [phone] Relation: Spouse Secondary Emergency Contact: Catapano,Catherine Address: [address]          [place], [place] [address] [place] Phone: [phone] Relation: Grandmother Mother: [name] of [place] Phone: [phone]  Code Status: FULL CODE Goals of Care: Advanced Directive information Advanced Directives 07/10/2017  Does Patient Have a Medical Advance Directive? No  Would patient like information on creating a medical advance directive? -     Chief Complaint  Patient presents with  . Medical Management of Chronic Issues    Pt is being seen for a complete physical and follow up on chronic conditions. Pt is having sinus congestion, left ear ache/drainage x 5 days.      HPI: Patient is a 31 y.o. male seen in today for an annual wellness exam.  He c/o 4-5 day hx left ear pain, sore throat and nasal congestion.  He was seen in UC last weekend and w/u neg  He is c/a snoring and choking at night while sleeping. Last sleep study >10 yrs ago. He does not wear CPAP but was dx with sleep apnea at age 75.  DM - followed by Endo Dr Dwyane Dee but he has not seen since Jan 2018. A1c 7.5%. He is diet controlled. LDL 84. Urine microalbumin/Cr ratio 0.7.  HTN - diet controlled. He prefers to hold off meds at this time.    Morbid obesity - he was off diet and exercise program this summer. He is up 4 lbs since last OV. BMI 41.51; wt 368 lb  Tobacco abuse - "occasional" smoker.   Depression screen PHQ 2/9  11/29/2016  Decreased Interest 0  Down, Depressed, Hopeless 0  PHQ - 2 Score 0    Fall Risk  07/10/2017 04/11/2017 11/29/2016  Falls in the past year? No No No   No flowsheet data found.   Health Maintenance  Topic Date Due  . OPHTHALMOLOGY EXAM  06/11/1996  . URINE MICROALBUMIN  04/27/2017  . INFLUENZA VACCINE  09/12/2017 (Originally 06/06/2017)  . HEMOGLOBIN A1C  07/15/2017  . FOOT EXAM  10/02/2017  . PNEUMOCOCCAL POLYSACCHARIDE VACCINE (2) 09/12/2021  . TETANUS/TDAP  11/16/2024  . HIV Screening  Completed    Past Medical History:  Diagnosis Date  . Blood in stool   . Diabetes mellitus without complication ([place])   . GERD (gastroesophageal reflux disease)   . History of anal fissures   . Low sperm motility     Past Surgical History:  Procedure Laterality Date  . ANAL FISTULECTOMY  2012   with sphhincterotomy  . ANAL FISTULOTOMY N/A 06/29/2015   Procedure: EXCISION PERI RECTAL SCAR/FISTULA, INTERNAL HEMMORRHOIDAL LIGATION, PEXY, MARSUPIALIZATION;  Surgeon: Michael Boston, MD;  [facility];  Service: General;  Laterality: N/A;  . EXAMINATION UNDER ANESTHESIA N/A 06/29/2015   Procedure: EXAM UNDER ANESTHESIA;  Surgeon: Michael Boston, MD;  [facility];  Service: General;  Laterality: N/A;  . RECTAL SURGERY  08/2010   Dr Zenia Resides    Family History  Problem Relation  Age of Onset  . Diabetes Mother   . Hypertension Mother   . Throat cancer Maternal Aunt   . Cirrhosis Maternal Grandfather   . Colon cancer Neg Hx   . Colon polyps Neg Hx   . Kidney disease Neg Hx    Family Status  Relation Status  . Mother Alive  . Father Alive  . Mat Aunt (Not Specified)  . Daughter Alive  . MGM Alive  . MGF Deceased  . PGM Alive  . PGF Deceased  . Neg Hx (Not Specified)    Social History   Social History  . Marital status: Married    Spouse name: N/A  . Number of children: 0  . Years of education: N/A   Occupational History  . Glass blower/designer; QC specialist    Social  History Main Topics  . Smoking status: Former Smoker    Packs/day: 0.50    Years: 10.00    Types: Cigarettes    Quit date: 05/26/2015  . Smokeless tobacco: Never Used     Comment: Pt given handout  . Alcohol use Yes     Comment: Occassionally  . Drug use: No  . Sexual activity: Yes   Other Topics Concern  . Not on file   Social History Narrative   Diet? Low carb meat/veggies water      Do you drink/eat things with caffeine? sometimes      Marital status?        married                            What year were you married? 2015      Do you live in a house, apartment, assisted living, condo, trailer, etc.? townhouse      Is it one or more stories? 2 story      How many persons live in your home? 3      Do you have any pets in your home? (please list) yes 1 dog      Current or past profession: forklift/machine operator      Do you exercise?       Only at work                               Type & how often? daily      Do you have a living will? n/a      Do you have a DNR form? n/a                                 If not, do you want to discuss one?      Do you have signed POA/HPOA for forms?  n/a       Allergies  Allergen Reactions  . Mushroom Extract Complex Swelling    Lips  . Shrimp [Shellfish Allergy] Swelling  . Tomato Anaphylaxis    Allergies as of 07/10/2017      Reactions   Mushroom Extract Complex Swelling   Lips   Shrimp [shellfish Allergy] Swelling   Tomato Anaphylaxis      Medication List       Accurate as of 07/10/17  3:22 PM. Always use your most recent med list.          Fluticasone-Salmeterol 250-50 MCG/DOSE Aepb Commonly known as:  ADVAIR DISKUS Inhale  1 puff into the lungs 2 (two) times daily.   glucose blood test strip Commonly known as:  BAYER CONTOUR TEST Use to test blood sugar once daily   sildenafil 100 MG tablet Commonly known as:  VIAGRA Take 1 tablet (100 mg total) by mouth daily as needed for erectile dysfunction.         Review of Systems:  Review of Systems  Constitutional: Positive for unexpected weight change.  HENT: Positive for congestion, ear pain and sore throat.   All other systems reviewed and are negative.   Physical Exam: Vitals:   07/10/17 1450  BP: 132/82  Pulse: 72  Resp: 18  Temp: 99.1 F (37.3 C)  TempSrc: Oral  SpO2: 96%  Weight: (!) 368 lb 9.6 oz (167.2 kg)  Height: 6' 7"  (2.007 m)   Body mass index is 41.52 kg/m. Physical Exam  Constitutional: He is oriented to person, place, and time. He appears well-developed and well-nourished. No distress.  Looks ill in NAD  HENT:  Head: Normocephalic and atraumatic.  Right Ear: External ear normal.  Left Ear: External ear normal.  Mouth/Throat: Oropharynx is clear and moist. No oropharyngeal exudate.  Left TM red, retracted, intact with external ear canal swelling and TTP; right TM dull and intact with no redness; no sinus TTP; nares with enlarged grey dry turbinates; MMM; R>L white tonsillar exudate; oropharynx cobblestoning and red but no exudate  Eyes: Pupils are equal, round, and reactive to light. EOM are normal. No scleral icterus.  Neck: Normal range of motion. Neck supple. Carotid bruit is not present. No tracheal deviation present. No thyromegaly present.  Cardiovascular: Normal rate, regular rhythm and intact distal pulses.  Exam reveals no gallop and no friction rub.   No murmur heard. No LE edema b/l. No calf TTP  Pulmonary/Chest: Effort normal and breath sounds normal. No respiratory distress. He has no wheezes. He has no rales. He exhibits no tenderness.  No rhonchi  Abdominal: Soft. Bowel sounds are normal. He exhibits no distension and no mass. There is no hepatosplenomegaly or hepatomegaly. There is no tenderness. There is no rebound and no guarding. No hernia.  Musculoskeletal: He exhibits edema. He exhibits no deformity.  Lymphadenopathy:    He has cervical adenopathy (right).  Neurological: He is alert and  oriented to person, place, and time. He has normal reflexes.  Skin: Skin is warm and dry. No rash noted.  Psychiatric: He has a normal mood and affect. His behavior is normal. Judgment and thought content normal.  Vitals reviewed.   Labs reviewed:  Basic Metabolic Panel:  Recent Labs  09/12/16 1342 11/13/16 1352 01/12/17 1224  NA 139 138 138  K 3.9 3.9 3.9  CL 107 103 107  CO2 25 29 25   GLUCOSE 201* 195* 136*  BUN 13 14 13   CREATININE 1.11 1.11 0.89  CALCIUM 9.4 9.8 9.6   Liver Function Tests:  Recent Labs  09/12/16 1342 11/13/16 1352  AST 26 28  ALT 30 31  ALKPHOS 54 60  BILITOT 0.4 0.4  PROT 6.7 6.9  ALBUMIN 3.9 3.9   No results for input(s): LIPASE, AMYLASE in the last 8760 hours. No results for input(s): AMMONIA in the last 8760 hours. CBC: No results for input(s): WBC, NEUTROABS, HGB, HCT, MCV, PLT in the last 8760 hours. Lipid Panel: No results for input(s): CHOL, HDL, LDLCALC, TRIG, CHOLHDL, LDLDIRECT in the last 8760 hours. Lab Results  Component Value Date   HGBA1C 7.5 (H) 01/12/2017  Procedures: No results found.  Assessment/Plan   ICD-10-CM   1. Well adult exam Z00.00   2. Acute suppurative otitis media of left ear without spontaneous rupture of tympanic membrane, recurrence not specified H66.002 amoxicillin-clavulanate (AUGMENTIN) 875-125 MG tablet    ibuprofen (ADVIL,MOTRIN) 800 MG tablet    cefTRIAXone (ROCEPHIN) injection 1 g  3. Essential hypertension I10   4. Diabetes mellitus type 2 in obese ([facility]) E11.69 CMP with eGFR   E66.9 Lipid Panel    Hemoglobin A1c    Microalbumin/Creatinine Ratio, Urine  5. Morbid obesity, unspecified obesity type ([place]) E66.01   6. Low testosterone in male R29.89   7. Snoring R06.83 Split night study   Rocephin 1 gm injection given today  START AUGMENTIN 875 MG 2 TIMES DAILY X 10 DAYS - START ON TOMORROW September 5TH  Take OTC probiotic (culturelle, activia or Publishing copy) daily while on  antibiotic  Continue other medications as ordered  Ok to wear cotton in ears due to pain  May take ibuprofen as needed for pain  Follow with Dr Dwyane Dee for diabetes management  Follow up in 3 mos for obesity and HTN Will discuss flu shot at next [facility] S. Perlie Gold  [facility] [address] [place], [place] [address] [phone] Cell (Monday-Friday 8 AM - 5 PM) [phone] After 5 PM and follow prompts

## 2017-07-10 NOTE — Patient Instructions (Signed)
Rocephin 1 gm injection given today  START AUGMENTIN 875 MG 2 TIMES DAILY X 10 DAYS - START ON TOMORROW September 5TH  Take OTC probiotic (culturelle, activia or Publishing copy) daily while on antibiotic  Continue other medications as ordered  Ok to wear cotton in ears due to pain  May take ibuprofen as needed for pain  Follow with Dr Dwyane Dee for diabetes management  Follow up in 3 mos for obesity and HTN

## 2017-07-11 DIAGNOSIS — R0683 Snoring: Secondary | ICD-10-CM | POA: Insufficient documentation

## 2017-07-11 DIAGNOSIS — R7989 Other specified abnormal findings of blood chemistry: Secondary | ICD-10-CM | POA: Insufficient documentation

## 2017-07-11 LAB — COMPLETE METABOLIC PANEL WITH GFR
ALT: 33 U/L (ref 9–46)
AST: 22 U/L (ref 10–40)
Albumin: 4.1 g/dL (ref 3.6–5.1)
Alkaline Phosphatase: 87 U/L (ref 40–115)
BUN: 12 mg/dL (ref 7–25)
CO2: 22 mmol/L (ref 20–32)
Calcium: 9.1 mg/dL (ref 8.6–10.3)
Chloride: 105 mmol/L (ref 98–110)
Creat: 0.86 mg/dL (ref 0.60–1.35)
GFR, Est African American: >89 mL/min (ref >=60)
GFR, Est Non African American: >89 mL/min (ref >=60)
Glucose, Bld: 124 mg/dL — ABNORMAL HIGH (ref 65–99)
Potassium: 4.4 mmol/L (ref 3.5–5.3)
Sodium: 139 mmol/L (ref 135–146)
Total Bilirubin: 0.3 mg/dL (ref 0.2–1.2)
Total Protein: 6.8 g/dL (ref 6.1–8.1)

## 2017-07-11 LAB — HEMOGLOBIN A1C
Hgb A1c MFr Bld: 7.2 % — ABNORMAL HIGH (ref <5.7)
Mean Plasma Glucose: 160 mg/dL

## 2017-07-11 LAB — LIPID PANEL
Cholesterol: 156 mg/dL (ref <200)
HDL: 45 mg/dL (ref >40)
LDL Cholesterol: 100 mg/dL — ABNORMAL HIGH (ref <100)
Total CHOL/HDL Ratio: 3.5 Ratio (ref <5.0)
Triglycerides: 55 mg/dL (ref <150)
VLDL: 11 mg/dL (ref <30)

## 2017-07-11 LAB — MICROALBUMIN / CREATININE URINE RATIO
Creatinine, Urine: 337 mg/dL (ref 20–370)
Microalb Creat Ratio: 11 mcg/mg creat (ref <30)
Microalb, Ur: 3.6 mg/dL

## 2017-07-12 ENCOUNTER — Telehealth (HOSPITAL_COMMUNITY): Payer: Self-pay | Admitting: Internal Medicine

## 2017-07-12 LAB — URINE CYTOLOGY ANCILLARY ONLY
Chlamydia: NEGATIVE
Neisseria Gonorrhea: NEGATIVE
Trichomonas: NEGATIVE

## 2017-07-12 MED ORDER — PENICILLIN V POTASSIUM 500 MG PO TABS: 500.0000 mg | ORAL_TABLET | Freq: Two times a day (BID) | ORAL | 0 refills | Status: DC

## 2017-07-12 NOTE — Telephone Encounter (Signed)
Clinical staff please let patient know that throat culture was positive for Strep.  Rx penicillin V sent to the pharmacy of record, [facility] on [address].  Recheck or followup with PCP for further evaluation if symptoms are not improving.  LM

## 2017-07-13 ENCOUNTER — Telehealth: Payer: Self-pay

## 2017-07-13 DIAGNOSIS — E7800 Pure hypercholesterolemia, unspecified: Secondary | ICD-10-CM

## 2017-07-13 DIAGNOSIS — T50905A Adverse effect of unspecified drugs, medicaments and biological substances, initial encounter: Secondary | ICD-10-CM

## 2017-07-13 MED ORDER — ATORVASTATIN CALCIUM 10 MG PO TABS: 10.0000 mg | ORAL_TABLET | Freq: Every day | ORAL | 3 refills | Status: DC

## 2017-07-13 NOTE — Telephone Encounter (Signed)
Patient states he lost his voice and was out of work Wednesday 07/11/17 and Thursday 07/12/17 and would like to know if Dr.Carter would give him a work note  Patient requesting call when note is complete, patient will pick-up  Last OV 07/10/17  Please advise

## 2017-07-13 NOTE — Telephone Encounter (Signed)
-----   Message from [place], [place] sent at 07/13/2017  2:25 PM EDT ----- lipitor 10mg  #30 take 1 tab po qhs with 3RF; repeat lipid panel and ALT in 1 month for hyperlipidemia

## 2017-07-13 NOTE — Telephone Encounter (Signed)
RX sent to pharmacy, future order placed for recheck of ALT in 1 month   Patient aware

## 2017-07-15 NOTE — Telephone Encounter (Signed)
Jonathan Taylor for work note for 9/5 and 9/6th

## 2017-07-16 ENCOUNTER — Telehealth: Payer: Self-pay | Admitting: *Deleted

## 2017-07-16 MED ORDER — PREDNISONE 10 MG PO TABS: ORAL_TABLET | ORAL | 0 refills | Status: DC

## 2017-07-16 NOTE — Telephone Encounter (Signed)
Patient called and stated that He was seen for ear infection on 07/10/17 and given an antibiotic for Amoxicillin. Patient states that his face is "kinda" swollen and has no control over the left side of face. Stated that when he tries to smile it goes to the right (he stated he looked up webmd and symptoms are similar to Bells Palsy) . Eyes are watering really bad. Congestion. Cannot hear out of right ear.  No fever. Still taking Amoxicillin as directed. Offered appointment for tomorrow but he wants your advise first. Please Advise.

## 2017-07-16 NOTE — Telephone Encounter (Signed)
Patient notified and agreed. Rx faxed to pharmacy.  

## 2017-07-16 NOTE — Telephone Encounter (Signed)
Sounds like Bell's palsy. I do recommend he completes abx especially since throat cx in UC (+) Strep. tx of Bell's palsy is supportive so push fluids. Recommend prednisone taper (prednisone 10mg  take 6 tabs po daily x 3 then 5 tabs x 2 days then 4 tabs x 2 days then 3 tabs x 2 days then 2 tabs x 2 days then 1 tab x 2 days and stop #50 tabs with no RF). May need additional medication such as valtrex if you have a cold sore. highly recommend you be seen in the office for further tx options. Get OTC artificial tears (recommend tears with omega 3 such as Refresh Optive mega 3)  and use prn to prevent dry eye.

## 2017-07-18 ENCOUNTER — Ambulatory Visit: Payer: BLUE CROSS/BLUE SHIELD | Admitting: Nurse Practitioner

## 2017-07-18 ENCOUNTER — Encounter: Payer: Self-pay | Admitting: Nurse Practitioner

## 2017-07-18 ENCOUNTER — Ambulatory Visit (INDEPENDENT_AMBULATORY_CARE_PROVIDER_SITE_OTHER): Payer: BLUE CROSS/BLUE SHIELD | Admitting: Nurse Practitioner

## 2017-07-18 VITALS — BP 124/84 | HR 64 | Temp 99.1°F | Resp 18 | Ht 79.0 in | Wt 372.2 lb

## 2017-07-18 DIAGNOSIS — G510 Bell's palsy: Secondary | ICD-10-CM

## 2017-07-18 DIAGNOSIS — J020 Streptococcal pharyngitis: Secondary | ICD-10-CM

## 2017-07-18 MED ORDER — VALACYCLOVIR HCL 1 G PO TABS: 1000.0000 mg | ORAL_TABLET | Freq: Three times a day (TID) | ORAL | 0 refills | Status: DC

## 2017-07-18 NOTE — Patient Instructions (Signed)
To take prednisone 6 tablets for 5 days then titrate down 1 tablet daily until complete   Make sure to use lubricate or eye gel to keep eye moisturized at night And eye drops continuously throughout the day  Cont antibiotic until complete Follow up if throat is not completely improved.

## 2017-07-18 NOTE — Progress Notes (Signed)
Careteam: Patient Care Team: Gildardo Cranker, DO as PCP - General (Internal Medicine)  Advanced Directive information Does Patient Have a Medical Advance Directive?: No  Allergies  Allergen Reactions  . Mushroom Extract Complex Swelling    Lips  . Shrimp [Shellfish Allergy] Swelling  . Tomato Anaphylaxis    Chief Complaint  Patient presents with  . Acute Visit    Pt is being seen due to Bells Palsey being no better. Pt has left side facial drooping/numbness, a white film that covers left eye at times.  . Other    Pt would like to discuss provider taking him out of work for 2-3 weeks. 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      HPI: Patient is a 31 y.o. male seen in the office today for work note and not doing better Saw Dr Eulas Post on 07/10/17 due to strept throat- was diagnosed in urgent care. Also had ear infections as well.  Called 2 days ago due to face being numb, metal taste in mouth, left side facial droop, left eye not closing. No pain noted Was told he had bells palsy and was started on prednisone with amoxicillin *due to ear infection*, using artifical tears which he is using during hte day.  trying to drink water but due to left sided paralysis having a hard time drinking. Read on line that he needs to rest but unable to do this with his job which is very stressful. 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 Can not hear out of one ear.  Job understands but needs paperwork.  Needs rest and feels like he is unable to do this with work.  Review of Systems:  Review of Systems  Constitutional: Positive for malaise/fatigue.  HENT: Positive for hearing loss (to left ear) and sore throat.   Eyes: Negative for blurred vision.  Respiratory: Negative for shortness of breath.   Cardiovascular: Negative for chest pain.  Musculoskeletal: Negative for myalgias.  Neurological: Positive for tingling, sensory change, focal  weakness and weakness.       Left sided facial droop with numbness, unable to close left eye    Past Medical History:  Diagnosis Date  . Blood in stool   . Diabetes mellitus without complication ([place])   . GERD (gastroesophageal reflux disease)   . History of anal fissures   . Low sperm motility    Past Surgical History:  Procedure Laterality Date  . ANAL FISTULECTOMY  2012   with sphhincterotomy  . ANAL FISTULOTOMY N/A 06/29/2015   Procedure: EXCISION PERI RECTAL SCAR/FISTULA, INTERNAL HEMMORRHOIDAL LIGATION, PEXY, MARSUPIALIZATION;  Surgeon: Michael Boston, MD;  Location: [facility];  Service: General;  Laterality: N/A;  . EXAMINATION UNDER ANESTHESIA N/A 06/29/2015   Procedure: EXAM UNDER ANESTHESIA;  Surgeon: Michael Boston, MD;  Location: [facility];  Service: General;  Laterality: N/A;  . RECTAL SURGERY  08/2010   Dr Zenia Resides   Social History:   reports that he quit smoking about 2 years ago. His smoking use included Cigarettes. He has a 5.00 pack-year smoking history. He has never used smokeless tobacco. He reports that he drinks alcohol. He reports that he does not use drugs.  Family History  Problem Relation Age of Onset  . Diabetes Mother   . Hypertension Mother   . Throat cancer Maternal Aunt   . Cirrhosis Maternal Grandfather   . Colon cancer Neg Hx   .  Colon polyps Neg Hx   . Kidney disease Neg Hx     Medications: Patient's Medications  New Prescriptions   No medications on file  Previous Medications   AMOXICILLIN-CLAVULANATE (AUGMENTIN) 875-125 MG TABLET    Take 1 tablet by mouth 2 (two) times daily. START ON September 5TH   ATORVASTATIN (LIPITOR) 10 MG TABLET    Take 1 tablet (10 mg total) by mouth daily.   FLUTICASONE-SALMETEROL (ADVAIR DISKUS) 250-50 MCG/DOSE AEPB    Inhale 1 puff into the lungs 2 (two) times daily.   GLUCOSE BLOOD (BAYER CONTOUR TEST) TEST STRIP    Use to test blood sugar once daily   IBUPROFEN (ADVIL,MOTRIN) 800 MG TABLET    Take 1 tablet (800 mg total)  by mouth every 8 (eight) hours as needed for headache, mild pain or moderate pain.   PENICILLIN V POTASSIUM (VEETID) 500 MG TABLET    Take 1 tablet (500 mg total) by mouth 2 (two) times daily.   PREDNISONE (DELTASONE) 10 MG TABLET    Take 6 tablets x 3days then 5 tablets x2 days, then 4 tablets x2 days then 3 tablets x2 days then 2 tablets x 2 days then 1 tablet x 2 days and then stop.   SILDENAFIL (VIAGRA) 100 MG TABLET    Take 1 tablet (100 mg total) by mouth daily as needed for erectile dysfunction.  Modified Medications   No medications on file  Discontinued Medications   No medications on file     Physical Exam:  Vitals:   07/18/17 1020  BP: 124/84  Pulse: 64  Resp: 18  Temp: 99.1 F (37.3 C)  TempSrc: Oral  SpO2: 98%  Weight: (!) 372 lb 3.2 oz (168.8 kg)  Height: 6\' 7"  (2.007 m)   Body mass index is 41.93 kg/m.  Physical Exam  Constitutional: He is oriented to person, place, and time. He appears well-developed and well-nourished. No distress.  HENT:  Head: Normocephalic and atraumatic.  Right Ear: External ear normal.  Left Ear: External ear normal.  Mouth/Throat: Oropharynx is clear and moist. No oropharyngeal exudate.  Left TM red, intact with external ear canal tender to touch right TM dull and intact with no redness; no sinus TTP; nares with enlarged grey dry turbinates; MMM; R>L white tonsillar exudate; oropharynx cobblestoning and red but no exudate  Eyes: Pupils are equal, round, and reactive to light. EOM are normal. No scleral icterus.  Neck: Normal range of motion. Neck supple. Carotid bruit is not present. No tracheal deviation present. No thyromegaly present.  Cardiovascular: Normal rate, regular rhythm and intact distal pulses.  Exam reveals no gallop and no friction rub.   No murmur heard. No LE edema b/l. No calf TTP  Pulmonary/Chest: Effort normal and breath sounds normal. No respiratory distress. He has no wheezes. He has no rales. He exhibits no  tenderness.  No rhonchi  Abdominal: Soft. Bowel sounds are normal. There is no hepatosplenomegaly or hepatomegaly.  Musculoskeletal: Normal range of motion.  Strength and sensation  equal bilaterally  Lymphadenopathy:    He has cervical adenopathy (right).  Neurological: He is alert and oriented to person, place, and time. He has normal reflexes. A sensory deficit is present.  Left sided facial droop, unable to close left eye.  No sensation to left face.    Skin: Skin is warm and dry. No rash noted.  Psychiatric: He has a normal mood and affect. His behavior is normal. Judgment and thought content normal.  Vitals  reviewed.   Labs reviewed: Basic Metabolic Panel:  Recent Labs  11/13/16 1352 01/12/17 1224 07/10/17 0412  NA 138 138 139  K 3.9 3.9 4.4  CL 103 107 105  CO2 29 25 22   GLUCOSE 195* 136* 124*  BUN 14 13 12   CREATININE 1.11 0.89 0.86  CALCIUM 9.8 9.6 9.1   Liver Function Tests:  Recent Labs  09/12/16 1342 11/13/16 1352 07/10/17 0412  AST 26 28 22   ALT 30 31 33  ALKPHOS 54 60 87  BILITOT 0.4 0.4 0.3  PROT 6.7 6.9 6.8  ALBUMIN 3.9 3.9 4.1   No results for input(s): LIPASE, AMYLASE in the last 8760 hours. No results for input(s): AMMONIA in the last 8760 hours. CBC: No results for input(s): WBC, NEUTROABS, HGB, HCT, MCV, PLT in the last 8760 hours. Lipid Panel:  Recent Labs  07/10/17 0412  CHOL 156  HDL 45  LDLCALC 100*  TRIG 55  CHOLHDL 3.5   TSH: No results for input(s): TSH in the last 8760 hours. A1C: Lab Results  Component Value Date   HGBA1C 7.2 (H) 07/10/2017     Assessment/Plan 1. Bell's palsy -started on prednisone 60 mg 2 days ago. Will cont prednisone 60 mg by mouth daily for total of 5 days then decrease by 10 mg daily until titration complete.  -discussed eye care in detail with pt and his wife who was on the phone - valACYclovir (VALTREX) 1000 MG tablet; Take 1 tablet (1,000 mg total) by mouth 3 (three) times daily.   Dispense: 21 tablet; Refill: 0 -work note written for 3 weeks, pt with decrease hearing and working around Museum/gallery exhibitions officer.   2. Streptococcal sore throat Ongoing, currently on Augmentin for 10 days. To cont full course.   Next appt: 3 weeks, sooner if  Sore throat does not improve or symptoms worsen.  Carlos American. Harle Battiest  [facility] [phone] 8 am - 5 pm) [phone] (after hours)

## 2017-07-18 NOTE — Telephone Encounter (Signed)
Patient was seen in office today.  

## 2017-07-19 ENCOUNTER — Telehealth: Payer: Self-pay | Admitting: *Deleted

## 2017-07-19 NOTE — Telephone Encounter (Signed)
Received fax from [facility], [employer] [phone] for Short Term Disability. Claim #:[id number] Policy #:[id number]. Janett Billow saw patient yesterday For Bell's Palsy and mentioned in her OV note. Printed OV note and attached to form and placed in [place] folder to fill out and sign. To be faxed back to:[phone]

## 2017-07-23 ENCOUNTER — Telehealth: Payer: Self-pay | Admitting: *Deleted

## 2017-07-23 MED ORDER — PREDNISONE 10 MG PO TABS: ORAL_TABLET | ORAL | 0 refills | Status: DC

## 2017-07-23 NOTE — Telephone Encounter (Signed)
Patient called and stated that you were going to call him in some Prednisone. He is taking it as you directed but is going to run out where Dr. Eulas Post had called in some for him. He stated you were going to call him in some. Pharmacy does not have Rx.   Also, patient states that his left eye is still hazy to see out of and hard to blink.    Appointment scheduled for a 3 week follow up for 10/2 Please Advise.

## 2017-07-23 NOTE — Telephone Encounter (Signed)
Based on the number he was given with Dr Eulas Post he should have had enough, please review instructions on my last office visit and see how many more he needs.

## 2017-07-23 NOTE — Telephone Encounter (Signed)
Patient states he needs 6 more tablets. He understands the directions. Called into pharmacy.

## 2017-08-07 ENCOUNTER — Encounter: Payer: Self-pay | Admitting: Nurse Practitioner

## 2017-08-07 ENCOUNTER — Ambulatory Visit (INDEPENDENT_AMBULATORY_CARE_PROVIDER_SITE_OTHER): Payer: BLUE CROSS/BLUE SHIELD | Admitting: Nurse Practitioner

## 2017-08-07 VITALS — BP 128/82 | HR 89 | Temp 98.8°F | Resp 18 | Ht 79.0 in | Wt 362.4 lb

## 2017-08-07 DIAGNOSIS — G510 Bell's palsy: Secondary | ICD-10-CM | POA: Diagnosis not present

## 2017-08-07 DIAGNOSIS — H66002 Acute suppurative otitis media without spontaneous rupture of ear drum, left ear: Secondary | ICD-10-CM | POA: Diagnosis not present

## 2017-08-07 MED ORDER — CEFDINIR 300 MG PO CAPS: 300.0000 mg | ORAL_CAPSULE | Freq: Two times a day (BID) | ORAL | 0 refills | Status: DC

## 2017-08-07 NOTE — Progress Notes (Signed)
Careteam: Patient Care Team: Gildardo Cranker, DO as PCP - General (Internal Medicine)  Advanced Directive information Does Patient Have a Medical Advance Directive?: No  Allergies  Allergen Reactions  . Mushroom Extract Complex Swelling    Lips  . Shrimp [Shellfish Allergy] Swelling  . Tomato Anaphylaxis    Chief Complaint  Patient presents with  . Follow-up    Pt is being seen for a 3 week follow up on Bell's Palsy. Pt reports that left eye still will not close and left ear has hearing loss.      HPI: Patient is a 31 y.o. male seen in the office today to follow up Bell's Palsy. Completed medication, prednisone and valacyclovir. Face is still numb but getting some sensation back.  still with abnormal smile and unable to close eye all the way.   Would like to go back to work but eye is still blurry, gets better and worse.  Hearing is still muffled and ear still sore.  tonsils still swollen but throat is not sore. No fevers or chills.  1 episode of shortness of breath, none since. No chest pains.   Review of Systems:  Review of Systems  Constitutional: Negative for chills, fever and malaise/fatigue.  HENT: Positive for ear pain and hearing loss (to left ear). Negative for sore throat.   Eyes: Negative for blurred vision.  Respiratory: Negative for shortness of breath.   Cardiovascular: Negative for chest pain.  Musculoskeletal: Negative for myalgias.  Neurological: Positive for tingling, sensory change and focal weakness. Negative for weakness.       Left sided facial droop with numbness, unable to close left eye    Past Medical History:  Diagnosis Date  . Blood in stool   . Diabetes mellitus without complication ([place])   . GERD (gastroesophageal reflux disease)   . History of anal fissures   . Low sperm motility    Past Surgical History:  Procedure Laterality Date  . ANAL FISTULECTOMY  2012   with sphhincterotomy  . ANAL FISTULOTOMY N/A 06/29/2015   Procedure:  EXCISION PERI RECTAL SCAR/FISTULA, INTERNAL HEMMORRHOIDAL LIGATION, PEXY, MARSUPIALIZATION;  Surgeon: Michael Boston, MD;  Location: [facility];  Service: General;  Laterality: N/A;  . EXAMINATION UNDER ANESTHESIA N/A 06/29/2015   Procedure: EXAM UNDER ANESTHESIA;  Surgeon: Michael Boston, MD;  Location: [facility];  Service: General;  Laterality: N/A;  . RECTAL SURGERY  08/2010   Dr Zenia Resides   Social History:   reports that he quit smoking about 2 years ago. His smoking use included Cigarettes. He has a 5.00 pack-year smoking history. He has never used smokeless tobacco. He reports that he drinks alcohol. He reports that he does not use drugs.  Family History  Problem Relation Age of Onset  . Diabetes Mother   . Hypertension Mother   . Throat cancer Maternal Aunt   . Cirrhosis Maternal Grandfather   . Colon cancer Neg Hx   . Colon polyps Neg Hx   . Kidney disease Neg Hx     Medications: Patient's Medications  New Prescriptions   No medications on file  Previous Medications   ATORVASTATIN (LIPITOR) 10 MG TABLET    Take 1 tablet (10 mg total) by mouth daily.   FLUTICASONE-SALMETEROL (ADVAIR DISKUS) 250-50 MCG/DOSE AEPB    Inhale 1 puff into the lungs 2 (two) times daily.   GLUCOSE BLOOD (BAYER CONTOUR TEST) TEST STRIP    Use to test blood sugar once daily   IBUPROFEN (ADVIL,MOTRIN)  800 MG TABLET    Take 1 tablet (800 mg total) by mouth every 8 (eight) hours as needed for headache, mild pain or moderate pain.   SILDENAFIL (VIAGRA) 100 MG TABLET    Take 1 tablet (100 mg total) by mouth daily as needed for erectile dysfunction.  Modified Medications   No medications on file  Discontinued Medications   AMOXICILLIN-CLAVULANATE (AUGMENTIN) 875-125 MG TABLET    Take 1 tablet by mouth 2 (two) times daily. START ON September 5TH   PENICILLIN V POTASSIUM (VEETID) 500 MG TABLET    Take 1 tablet (500 mg total) by mouth 2 (two) times daily.   PREDNISONE (DELTASONE) 10 MG TABLET    Take 6 tablets x 3days then 5  tablets x2 days, then 4 tablets x2 days then 3 tablets x2 days then 2 tablets x 2 days then 1 tablet x 2 days and then stop.   PREDNISONE (DELTASONE) 10 MG TABLET    Take four tablets then three tablets then two tablets then one then stop.   VALACYCLOVIR (VALTREX) 1000 MG TABLET    Take 1 tablet (1,000 mg total) by mouth 3 (three) times daily.     Physical Exam:  Vitals:   08/07/17 1011  BP: 128/82  Pulse: 89  Resp: 18  Temp: 98.8 F (37.1 C)  TempSrc: Oral  SpO2: 97%  Weight: (!) 362 lb 6.4 oz (164.4 kg)  Height: 6\' 7"  (2.007 m)   Body mass index is 40.83 kg/m.  Physical Exam  Constitutional: He is oriented to person, place, and time. He appears well-developed and well-nourished. No distress.  HENT:  Head: Normocephalic and atraumatic.  Right Ear: External ear normal.  Left Ear: External ear normal.  Mouth/Throat: Oropharynx is clear and moist. No oropharyngeal exudate.  Left TM red, intact with external ear canal tender to touch right TM dull and intact with no redness; no sinus TTP; MMM; enlarged tonsils but no exudate L>R  Eyes: Pupils are equal, round, and reactive to light. EOM are normal. No scleral icterus.  Neck: Normal range of motion. Neck supple. Carotid bruit is not present. No tracheal deviation present. No thyromegaly present.  Cardiovascular: Normal rate, regular rhythm and normal heart sounds.   Pulmonary/Chest: Effort normal and breath sounds normal. No respiratory distress. He has no wheezes. He has no rales. He exhibits no tenderness.  Abdominal: Soft. Bowel sounds are normal. There is no hepatosplenomegaly or hepatomegaly.  Musculoskeletal: Normal range of motion.  Strength and sensation  equal bilaterally  Lymphadenopathy:    He has no cervical adenopathy.  Neurological: He is alert and oriented to person, place, and time. He has normal reflexes. A sensory deficit is present.  Left sided facial droop, unable to close left eye  Skin: Skin is warm and  dry. No rash noted.  Psychiatric: He has a normal mood and affect. His behavior is normal. Judgment and thought content normal.  Vitals reviewed.   Labs reviewed: Basic Metabolic Panel:  Recent Labs  11/13/16 1352 01/12/17 1224 07/10/17 0412  NA 138 138 139  K 3.9 3.9 4.4  CL 103 107 105  CO2 29 25 22   GLUCOSE 195* 136* 124*  BUN 14 13 12   CREATININE 1.11 0.89 0.86  CALCIUM 9.8 9.6 9.1   Liver Function Tests:  Recent Labs  09/12/16 1342 11/13/16 1352 07/10/17 0412  AST 26 28 22   ALT 30 31 33  ALKPHOS 54 60 87  BILITOT 0.4 0.4 0.3  PROT 6.7  6.9 6.8  ALBUMIN 3.9 3.9 4.1   No results for input(s): LIPASE, AMYLASE in the last 8760 hours. No results for input(s): AMMONIA in the last 8760 hours. CBC: No results for input(s): WBC, NEUTROABS, HGB, HCT, MCV, PLT in the last 8760 hours. Lipid Panel:  Recent Labs  07/10/17 0412  CHOL 156  HDL 45  LDLCALC 100*  TRIG 55  CHOLHDL 3.5   TSH: No results for input(s): TSH in the last 8760 hours. A1C: Lab Results  Component Value Date   HGBA1C 7.2 (H) 07/10/2017     Assessment/Plan 1. Bell's palsy Has seen minimal but some improvement in symptoms. Still with decreased hearing however middle ear effusion still present and could be contributing. -to cont eye care to avoid abrasion.  2. Acute suppurative otitis media of left ear without spontaneous rupture of tympanic membrane, recurrence not specified - cefdinir (OMNICEF) 300 MG capsule; Take 1 capsule (300 mg total) by mouth 2 (two) times daily.  Dispense: 20 capsule; Refill: 0  Next appt: 2 weeks.  Carlos American. Harle Battiest  [facility] [phone] 8 am - 5 pm) [phone] (after hours)

## 2017-08-07 NOTE — Patient Instructions (Addendum)
Use probiotic and yogurt with antibiotic  omnicef 300 mg by mouth twice daily for 10 days.   Follow up in 2 weeks   Cont with eye care.

## 2017-08-08 ENCOUNTER — Telehealth: Payer: Self-pay | Admitting: *Deleted

## 2017-08-08 NOTE — Telephone Encounter (Signed)
Received STD paperwork for patient. Placed in your folder for completion.

## 2017-08-09 NOTE — Telephone Encounter (Signed)
This has been done.

## 2017-08-10 ENCOUNTER — Telehealth: Payer: Self-pay | Admitting: *Deleted

## 2017-08-10 NOTE — Telephone Encounter (Signed)
Larene Beach with [employer] called and requested the Medical Records for visit 08/07/17 to go with FMLA forms filled out with Janett Billow. To be faxed to [phone]. Faxed.

## 2017-08-21 ENCOUNTER — Other Ambulatory Visit: Payer: Self-pay

## 2017-08-21 ENCOUNTER — Encounter: Payer: Self-pay | Admitting: Nurse Practitioner

## 2017-08-21 ENCOUNTER — Ambulatory Visit (INDEPENDENT_AMBULATORY_CARE_PROVIDER_SITE_OTHER): Payer: BLUE CROSS/BLUE SHIELD | Admitting: Nurse Practitioner

## 2017-08-21 VITALS — BP 132/84 | HR 96 | Temp 98.6°F | Resp 18 | Ht 79.0 in | Wt 364.8 lb

## 2017-08-21 DIAGNOSIS — G510 Bell's palsy: Secondary | ICD-10-CM | POA: Diagnosis not present

## 2017-08-21 DIAGNOSIS — R61 Generalized hyperhidrosis: Secondary | ICD-10-CM

## 2017-08-21 DIAGNOSIS — T50905A Adverse effect of unspecified drugs, medicaments and biological substances, initial encounter: Secondary | ICD-10-CM

## 2017-08-21 DIAGNOSIS — J351 Hypertrophy of tonsils: Secondary | ICD-10-CM | POA: Diagnosis not present

## 2017-08-21 DIAGNOSIS — H66002 Acute suppurative otitis media without spontaneous rupture of ear drum, left ear: Secondary | ICD-10-CM | POA: Diagnosis not present

## 2017-08-21 DIAGNOSIS — E7800 Pure hypercholesterolemia, unspecified: Secondary | ICD-10-CM

## 2017-08-21 DIAGNOSIS — H539 Unspecified visual disturbance: Secondary | ICD-10-CM

## 2017-08-21 LAB — BASIC METABOLIC PANEL WITH GFR
BUN: 14 mg/dL (ref 7–25)
CO2: 23 mmol/L (ref 20–32)
Calcium: 9.7 mg/dL (ref 8.6–10.3)
Chloride: 106 mmol/L (ref 98–110)
Creat: 0.92 mg/dL (ref 0.60–1.35)
GFR, Est African American: 128 mL/min/{1.73_m2} (ref >=60)
GFR, Est Non African American: 110 mL/min/{1.73_m2} (ref >=60)
Glucose, Bld: 147 mg/dL — ABNORMAL HIGH (ref 65–139)
Potassium: 4 mmol/L (ref 3.5–5.3)
Sodium: 138 mmol/L (ref 135–146)

## 2017-08-21 LAB — CBC WITH DIFFERENTIAL/PLATELET
Basophils Absolute: 33 cells/uL (ref 0–200)
Basophils Relative: 0.5 %
Eosinophils Absolute: 39 cells/uL (ref 15–500)
Eosinophils Relative: 0.6 %
HCT: 39.1 % (ref 38.5–50.0)
Hemoglobin: 13.2 g/dL (ref 13.2–17.1)
Lymphs Abs: 2347 cells/uL (ref 850–3900)
MCH: 25.9 pg — ABNORMAL LOW (ref 27.0–33.0)
MCHC: 33.8 g/dL (ref 32.0–36.0)
MCV: 76.7 fL — ABNORMAL LOW (ref 80.0–100.0)
MPV: 11.2 fL (ref 7.5–12.5)
Monocytes Relative: 8.1 %
Neutro Abs: 3556 cells/uL (ref 1500–7800)
Neutrophils Relative %: 54.7 %
Platelets: 297 10*3/uL (ref 140–400)
RBC: 5.1 10*6/uL (ref 4.20–5.80)
RDW: 14 % (ref 11.0–15.0)
Total Lymphocyte: 36.1 %
WBC mixed population: 527 cells/uL (ref 200–950)
WBC: 6.5 10*3/uL (ref 3.8–10.8)

## 2017-08-21 LAB — ALT: ALT: 34 U/L (ref 9–46)

## 2017-08-21 LAB — TSH: TSH: 1.54 mIU/L (ref 0.40–4.50)

## 2017-08-21 NOTE — Patient Instructions (Signed)
To see ophthalmology for eyes [facility] in [place], [place] Address: [address] [place], [place], [place] [address] Phone: [phone]  Suann Larry MD Ophthalmologist in [place], [place] Address: [address] [place], [place], [place] [address] Phone: [phone]

## 2017-08-21 NOTE — Progress Notes (Signed)
Careteam: Patient Care Team: Jonathan Cranker, DO as PCP - General (Internal Medicine)  Advanced Directive information Does Patient Have a Medical Advance Directive?: No  Allergies  Allergen Reactions  . Mushroom Extract Complex Swelling    Lips  . Shrimp [Shellfish Allergy] Swelling  . Tomato Anaphylaxis    Chief Complaint  Patient presents with  . Follow-up    Pt is being seen for a 2 week follow up on Bell's Palsy. Still cannot hear out of left ear, left eye will not close fully, hoarseness comes and goes, feeling has come back on left side of face but still has tingling or flinches.      HPI: Patient is a 31 y.o. male seen in the office today for follow up Bells palsey of left side of face. He says he still has hearing loss in left ear. He says that when he blows his nose there is a pop and he can hear "crystal clear" but a moment then it closes up again. He says that there is some improvement of sensation on left side. The muscles cramp and hurt now for the past week. Before there was nothing.  He states his hearing is still muffled. He says that his left tonsil is swollen denies sore throat, fevers and chills. There is some sensitivity to loud sounds. He says that loud sounds like horns make his left ear hurt.  He says that his eye is blurry a times and also increase tearing. Sometimes he sees fine and then other times reports problems.  Reports it is hard for him to focus at night.  He also says that he has sweats that come and go intermediately without regard to activity, worse at night. Will just start sweating. Again denies fevers or chills.    Review of Systems:  Review of Systems  Constitutional: Negative for chills, fever and weight loss.  HENT: Positive for ear pain and hearing loss. Negative for congestion, ear discharge, nosebleeds, sinus pain, sore throat and tinnitus.   Eyes: Positive for blurred vision (States vision is blurred at night) and double vision.  Negative for photophobia and pain.  Respiratory: Negative for cough and shortness of breath.   Cardiovascular: Negative for chest pain, palpitations and claudication.  Gastrointestinal: Negative for diarrhea, heartburn, nausea and vomiting.  Genitourinary: Negative for dysuria and frequency.  Musculoskeletal: Negative for myalgias and neck pain.  Skin: Negative for rash.  Neurological: Positive for sensory change (Reduced sensation left side of face.). Negative for dizziness, focal weakness and headaches.  Psychiatric/Behavioral: Negative for depression and suicidal ideas. The patient is not nervous/anxious.     Past Medical History:  Diagnosis Date  . Blood in stool   . Diabetes mellitus without complication ([place])   . GERD (gastroesophageal reflux disease)   . History of anal fissures   . Low sperm motility    Past Surgical History:  Procedure Laterality Date  . ANAL FISTULECTOMY  2012   with sphhincterotomy  . ANAL FISTULOTOMY N/A 06/29/2015   Procedure: EXCISION PERI RECTAL SCAR/FISTULA, INTERNAL HEMMORRHOIDAL LIGATION, PEXY, MARSUPIALIZATION;  Surgeon: Michael Boston, MD;  Location: [facility];  Service: General;  Laterality: N/A;  . EXAMINATION UNDER ANESTHESIA N/A 06/29/2015   Procedure: EXAM UNDER ANESTHESIA;  Surgeon: Michael Boston, MD;  Location: [facility];  Service: General;  Laterality: N/A;  . RECTAL SURGERY  08/2010   Dr Jonathan Taylor   Social History:   reports that he quit smoking about 2 years ago. His smoking use  included Cigarettes. He has a 5.00 pack-year smoking history. He has never used smokeless tobacco. He reports that he drinks alcohol. He reports that he does not use drugs.  Family History  Problem Relation Age of Onset  . Diabetes Mother   . Hypertension Mother   . Throat cancer Maternal Aunt   . Cirrhosis Maternal Grandfather   . Colon cancer Neg Hx   . Colon polyps Neg Hx   . Kidney disease Neg Hx     Medications: Patient's Medications  New Prescriptions   No  medications on file  Previous Medications   ATORVASTATIN (LIPITOR) 10 MG TABLET    Take 1 tablet (10 mg total) by mouth daily.   FLUTICASONE-SALMETEROL (ADVAIR DISKUS) 250-50 MCG/DOSE AEPB    Inhale 1 puff into the lungs 2 (two) times daily.   GLUCOSE BLOOD (BAYER CONTOUR TEST) TEST STRIP    Use to test blood sugar once daily   IBUPROFEN (ADVIL,MOTRIN) 800 MG TABLET    Take 1 tablet (800 mg total) by mouth every 8 (eight) hours as needed for headache, mild pain or moderate pain.   SILDENAFIL (VIAGRA) 100 MG TABLET    Take 1 tablet (100 mg total) by mouth daily as needed for erectile dysfunction.  Modified Medications   No medications on file  Discontinued Medications   CEFDINIR (OMNICEF) 300 MG CAPSULE    Take 1 capsule (300 mg total) by mouth 2 (two) times daily.     Physical Exam:  Vitals:   08/21/17 1307  BP: 132/84  Pulse: 96  Resp: 18  Temp: 98.6 F (37 C)  TempSrc: Oral  SpO2: 98%  Weight: (!) 364 lb 12.8 oz (165.5 kg)  Height: 6' 7"  (2.007 m)   Body mass index is 41.1 kg/m.  Physical Exam  Constitutional: He is oriented to person, place, and time. He appears well-developed and well-nourished. No distress.  HENT:  Head: Normocephalic and atraumatic.  Right Ear: External ear normal.  Left Ear: There is tenderness. Tympanic membrane is erythematous and bulging. A middle ear effusion is present.  Mouth/Throat: Tonsils are 0 on the right. Tonsils are 2+ on the left. No tonsillar exudate.  Eyes: Pupils are equal, round, and reactive to light. Conjunctivae and EOM are normal. Right eye exhibits no discharge. Left eye exhibits no discharge. No scleral icterus.  Neck: Normal range of motion. Neck supple. No tracheal deviation present.  Cardiovascular: Normal rate, regular rhythm, normal heart sounds and intact distal pulses.   No murmur heard. Pulmonary/Chest: Effort normal and breath sounds normal. No respiratory distress. He has no wheezes.  Musculoskeletal: Normal range of  motion. He exhibits no tenderness.  Lymphadenopathy:    He has no cervical adenopathy.  Neurological: He is alert and oriented to person, place, and time. A sensory deficit (reduced sensation left side of face.) is present.  Skin: Skin is warm and dry. Capillary refill takes less than 2 seconds. He is not diaphoretic.  Psychiatric: He has a normal mood and affect. His behavior is normal. Judgment and thought content normal.  Nursing note and vitals reviewed.   Labs reviewed: Basic Metabolic Panel:  Recent Labs  11/13/16 1352 01/12/17 1224 07/10/17 0412  NA 138 138 139  K 3.9 3.9 4.4  CL 103 107 105  CO2 29 25 22   GLUCOSE 195* 136* 124*  BUN 14 13 12   CREATININE 1.11 0.89 0.86  CALCIUM 9.8 9.6 9.1   Liver Function Tests:  Recent Labs  09/12/16 1342 11/13/16  1352 07/10/17 0412  AST 26 28 22   ALT 30 31 33  ALKPHOS 54 60 87  BILITOT 0.4 0.4 0.3  PROT 6.7 6.9 6.8  ALBUMIN 3.9 3.9 4.1   No results for input(s): LIPASE, AMYLASE in the last 8760 hours. No results for input(s): AMMONIA in the last 8760 hours. CBC: No results for input(s): WBC, NEUTROABS, HGB, HCT, MCV, PLT in the last 8760 hours. Lipid Panel:  Recent Labs  07/10/17 0412  CHOL 156  HDL 45  LDLCALC 100*  TRIG 55  CHOLHDL 3.5   TSH: No results for input(s): TSH in the last 8760 hours. A1C: Lab Results  Component Value Date   HGBA1C 7.2 (H) 07/10/2017     Assessment/Plan 1. Bell's palsy  Improving as sensation is slowly returning to left side of face. He states cramping as sensation returns. Movement of eye and eyebrow is noted and an improvement from last office visit.  2. Acute suppurative otitis media of left ear without spontaneous rupture of tympanic membrane, recurrence not specified Patient continues to have reduced hearing with effusion and erythema to left ear. - Ambulatory referral to ENT  3. Enlarged tonsils Enlarged left tonsil +2 without exudate or sore throat. - Ambulatory  referral to ENT for further evaluation   4. Night sweats Patient complains of night sweats that occurred started occurring three weeks ago. - CBC with Differential/Platelets - BMP with eGFR - TSH  5. Visual disturbance -plans to see eye specialist due to this. Encouraged him to see ophthalmologist for full exam. Suspect dry eye due to increase tearing but pt also reports other visual changes which come and go.  Ophthalmologist information provided.   Next appt: 2 weeks, sooner if needed  Meir Elwood K. Harle Battiest  [facility] [phone] 8 am - 5 pm) [phone] (after hours)

## 2017-08-27 ENCOUNTER — Telehealth: Payer: Self-pay | Admitting: *Deleted

## 2017-08-27 NOTE — Telephone Encounter (Signed)
Patient called requesting his records from 08/07/17 and 08/21/17 to be faxed to [employer] for his Disability. Faxed. Fax#: [phone]

## 2017-08-30 ENCOUNTER — Telehealth: Payer: Self-pay

## 2017-08-30 NOTE — Telephone Encounter (Signed)
I spoke with patient and he stated that his return to work date is 09/10/17. Janett Billow has completed the short term disability paperwork, so I will fax the forms along with office notes to [employer] at [phone].

## 2017-08-30 NOTE — Telephone Encounter (Signed)
-----   Message from [name], NP sent at 08/29/2017  5:26 PM EDT ----- Regarding: follow up with pt regarding disability paperwork Pt with appt at  [place]; patient is scheduled 08/30/17 @ 11:10 w/ Dr. Melissa Montane,  Can we follow up with him tomorrow afternoons to see what they recommend. I am filling out his paperwork for being out of work and if I need to extend the time I would rather just put it on this paperwork than doing another batch of papers in 1 week  Thanks so much.

## 2017-09-04 ENCOUNTER — Encounter: Payer: Self-pay | Admitting: Nurse Practitioner

## 2017-09-04 ENCOUNTER — Ambulatory Visit (INDEPENDENT_AMBULATORY_CARE_PROVIDER_SITE_OTHER): Payer: BLUE CROSS/BLUE SHIELD | Admitting: Nurse Practitioner

## 2017-09-04 VITALS — BP 140/88 | HR 97 | Temp 98.7°F | Ht 79.0 in | Wt 366.8 lb

## 2017-09-04 DIAGNOSIS — R0683 Snoring: Secondary | ICD-10-CM

## 2017-09-04 DIAGNOSIS — G510 Bell's palsy: Secondary | ICD-10-CM

## 2017-09-04 DIAGNOSIS — H6592 Unspecified nonsuppurative otitis media, left ear: Secondary | ICD-10-CM | POA: Diagnosis not present

## 2017-09-04 NOTE — Progress Notes (Signed)
Careteam: Patient Care Team: Gildardo Cranker, DO as PCP - General (Internal Medicine)  Advanced Directive information    Allergies  Allergen Reactions  . Mushroom Extract Complex Swelling and Other (See Comments)    Other Lips  . Shrimp [Shellfish Allergy] Swelling  . Tomato Anaphylaxis    Chief Complaint  Patient presents with  . Follow-up    Medical Management of Bell's Palsy. No Complaints-"The Best I've felt in a long time"     HPI: Patient is a 31 y.o. male seen in the office today to follow up due to bell's palsy and left middle ear effusion. Pt was seen by ENT and plans for tubes to placed. Still can not hear well due to the fluid. Surgery scheduled Nov 6th Tonsil remain enlarged but reports this can be expected and it may take a while to do down. Went to ophthalmologist due to blurred vision- reports some corneal damage due to not being able to blink but it is healing nicely and should not cause permeant damage. Pt reports vision is better and getting better. Plans to go back to ophthalmology in december.  Sensation is coming back. Smile equal, blinking normal and eyes closing at this time.  Snoring a lot, wife reports he gets choked in his sleep and has episodes were he does not breath.  Would like to get evaluated for sleep apnea.  Review of Systems:  Review of Systems  Constitutional: Negative for chills, fever and weight loss.  HENT: Positive for ear pain and hearing loss. Negative for congestion, ear discharge, nosebleeds, sinus pain, sore throat and tinnitus.   Eyes: Positive for blurred vision (States vision is blurred at night). Negative for photophobia and pain.  Respiratory: Negative for cough and shortness of breath.   Cardiovascular: Negative for chest pain, palpitations and claudication.  Gastrointestinal: Negative for diarrhea, heartburn, nausea and vomiting.  Genitourinary: Negative for dysuria and frequency.  Musculoskeletal: Negative for myalgias and  neck pain.  Skin: Negative for rash.  Neurological: Positive for sensory change (Reduced sensation left side of face-improving ). Negative for dizziness, focal weakness and headaches.  Psychiatric/Behavioral: Negative for depression and suicidal ideas. The patient is not nervous/anxious.     Past Medical History:  Diagnosis Date  . Blood in stool   . Diabetes mellitus without complication ([place])   . GERD (gastroesophageal reflux disease)   . History of anal fissures   . Low sperm motility    Past Surgical History:  Procedure Laterality Date  . ANAL FISTULECTOMY  2012   with sphhincterotomy  . ANAL FISTULOTOMY N/A 06/29/2015   Procedure: EXCISION PERI RECTAL SCAR/FISTULA, INTERNAL HEMMORRHOIDAL LIGATION, PEXY, MARSUPIALIZATION;  Surgeon: Michael Boston, MD;  Location: [facility];  Service: General;  Laterality: N/A;  . EXAMINATION UNDER ANESTHESIA N/A 06/29/2015   Procedure: EXAM UNDER ANESTHESIA;  Surgeon: Michael Boston, MD;  Location: [facility];  Service: General;  Laterality: N/A;  . RECTAL SURGERY  08/2010   Dr Zenia Resides   Social History:   reports that he quit smoking about 2 years ago. His smoking use included Cigarettes. He has a 5.00 pack-year smoking history. He has never used smokeless tobacco. He reports that he drinks alcohol. He reports that he does not use drugs.  Family History  Problem Relation Age of Onset  . Diabetes Mother   . Hypertension Mother   . Throat cancer Maternal Aunt   . Cirrhosis Maternal Grandfather   . Colon cancer Neg Hx   . Colon  polyps Neg Hx   . Kidney disease Neg Hx     Medications: Patient's Medications  New Prescriptions   No medications on file  Previous Medications   ATORVASTATIN (LIPITOR) 10 MG TABLET    Take 1 tablet (10 mg total) by mouth daily.   FLUTICASONE-SALMETEROL (ADVAIR DISKUS) 250-50 MCG/DOSE AEPB    Inhale 1 puff into the lungs 2 (two) times daily.   GLUCOSE BLOOD (BAYER CONTOUR TEST) TEST STRIP    Use to test blood sugar once daily     IBUPROFEN (ADVIL,MOTRIN) 800 MG TABLET    Take 1 tablet (800 mg total) by mouth every 8 (eight) hours as needed for headache, mild pain or moderate pain.   SILDENAFIL (VIAGRA) 100 MG TABLET    Take 1 tablet (100 mg total) by mouth daily as needed for erectile dysfunction.  Modified Medications   No medications on file  Discontinued Medications   No medications on file     Physical Exam:  Vitals:   09/04/17 1302  BP: 140/88  Pulse: 97  Temp: 98.7 F (37.1 C)  TempSrc: Oral  SpO2: 98%  Weight: (!) 366 lb 12.8 oz (166.4 kg)  Height: 6\' 7"  (2.007 m)   Body mass index is 41.32 kg/m.  Physical Exam  Constitutional: He is oriented to person, place, and time. He appears well-developed and well-nourished. No distress.  HENT:  Head: Normocephalic and atraumatic.  Right Ear: External ear normal.  Left Ear: There is tenderness. Tympanic membrane is erythematous and bulging. A middle ear effusion is present.  Mouth/Throat: Tonsils are 0 on the right. Tonsils are 2+ on the left. No tonsillar exudate.  Eyes: Pupils are equal, round, and reactive to light. Conjunctivae and EOM are normal. Right eye exhibits no discharge. Left eye exhibits no discharge. No scleral icterus.  Neck: Normal range of motion. Neck supple. No tracheal deviation present.  Cardiovascular: Normal rate, regular rhythm and normal heart sounds.   No murmur heard. Pulmonary/Chest: Effort normal and breath sounds normal. No respiratory distress. He has no wheezes.  Musculoskeletal: Normal range of motion. He exhibits no tenderness.  Lymphadenopathy:    He has no cervical adenopathy.  Neurological: He is alert and oriented to person, place, and time. A sensory deficit (reduced sensation left side of face.) is present.  Skin: Skin is warm and dry. Capillary refill takes less than 2 seconds. He is not diaphoretic.  Psychiatric: He has a normal mood and affect. His behavior is normal. Judgment and thought content normal.   Nursing note and vitals reviewed.   Labs reviewed: Basic Metabolic Panel:  Recent Labs  01/12/17 1224 07/10/17 0412 08/21/17 1407  NA 138 139 138  K 3.9 4.4 4.0  CL 107 105 106  CO2 25 22 23   GLUCOSE 136* 124* 147*  BUN 13 12 14   CREATININE 0.89 0.86 0.92  CALCIUM 9.6 9.1 9.7  TSH  --   --  1.54   Liver Function Tests:  Recent Labs  09/12/16 1342 11/13/16 1352 07/10/17 0412 08/21/17 1407  AST 26 28 22   --   ALT 30 31 33 34  ALKPHOS 54 60 87  --   BILITOT 0.4 0.4 0.3  --   PROT 6.7 6.9 6.8  --   ALBUMIN 3.9 3.9 4.1  --    No results for input(s): LIPASE, AMYLASE in the last 8760 hours. No results for input(s): AMMONIA in the last 8760 hours. CBC:  Recent Labs  08/21/17 1407  WBC  6.5  NEUTROABS 3,556  HGB 13.2  HCT 39.1  MCV 76.7*  PLT 297   Lipid Panel:  Recent Labs  07/10/17 0412  CHOL 156  HDL 45  LDLCALC 100*  TRIG 55  CHOLHDL 3.5   TSH:  Recent Labs  08/21/17 1407  TSH 1.54   A1C: Lab Results  Component Value Date   HGBA1C 7.2 (H) 07/10/2017     Assessment/Plan .1. Bell's palsy -improving.   2. Fluid level behind tympanic membrane of left ear -ongoing; plans to have a tube placed by ENT on 09/11/17  3. Snoring - Ambulatory referral to Pulmonology- for evaluation of OSA  Next appt: as scheduled Keyvin Rison K. Harle Battiest  [facility] [phone] 8 am - 5 pm) [phone] (after hours)

## 2017-09-10 ENCOUNTER — Telehealth: Payer: Self-pay

## 2017-09-10 NOTE — Telephone Encounter (Signed)
Patient called with Jonathan Taylor (Dr.Byers assistant) on the phone. Jonathan Taylor states patient needs to have a tube placed in his ear and the insurance company is requiring office notes and endoscopy if performed.  I questioned if OV notes were sent with the referral and Jonathan Taylor responded no. I faxed last 5 OV notes and ED note from September to [place] office.    Melissa Montane was added to patient's Communication Care Team

## 2017-10-02 ENCOUNTER — Ambulatory Visit: Payer: BLUE CROSS/BLUE SHIELD | Admitting: Internal Medicine

## 2017-10-22 ENCOUNTER — Ambulatory Visit (INDEPENDENT_AMBULATORY_CARE_PROVIDER_SITE_OTHER): Payer: BLUE CROSS/BLUE SHIELD | Admitting: Pulmonary Disease

## 2017-10-22 ENCOUNTER — Encounter: Payer: Self-pay | Admitting: Pulmonary Disease

## 2017-10-22 VITALS — BP 130/92 | HR 107 | Ht 77.0 in | Wt 372.0 lb

## 2017-10-22 DIAGNOSIS — Z716 Tobacco abuse counseling: Secondary | ICD-10-CM

## 2017-10-22 DIAGNOSIS — G4726 Circadian rhythm sleep disorder, shift work type: Secondary | ICD-10-CM

## 2017-10-22 DIAGNOSIS — R29818 Other symptoms and signs involving the nervous system: Secondary | ICD-10-CM | POA: Diagnosis not present

## 2017-10-22 DIAGNOSIS — Z6841 Body Mass Index (BMI) 40.0 and over, adult: Secondary | ICD-10-CM | POA: Diagnosis not present

## 2017-10-22 NOTE — Progress Notes (Signed)
[facility]  Chief Complaint  Patient presents with  . sleep consult    Pt referred by Dr. Sherrie Mustache MD. Pt works 3rd shift; snores louding , pt will wake up coughing, and wakes up gasp for air at times.     Vital signs: BP (!) 130/92 (BP Location: Left Arm, Cuff Size: Large)   Pulse (!) 107   Ht 6\' 5"  (1.956 m)   Wt (!) 372 lb (168.7 kg)   SpO2 96%   BMI 44.11 kg/m   History of Present Illness: Jonathan Taylor is a 31 y.o. male for evaluation of sleep problems.  His wife has been concerned about his snoring.  He will stop breathing.  He falls asleep easily if his is sitting quiet.  He wakes up hearing himself snore.  He can't sleep on his back.  He wakes up frequently during dreams.  He goes to sleep at 6 am.  He falls asleep in 20 minutes.  He wakes up some times to use the bathroom.  He gets out of bed at 12 pm.  He feels tired in the morning.  He denies morning headache.  He does not use anything to help him fall sleep or stay awake.  He works 3rd shift, and and his shifts are 12 to 14 hours long.  He has been told he grinds his teeth while asleep.  He doesn't wear a mouth guard.  He had Bell's Palsy and strep throat recently, and was told he had enlarged tonsils.  He denies sleep walking, sleep talking, bruxism, or nightmares.  There is no history of restless legs.  He denies sleep hallucinations, sleep paralysis, or cataplexy.  The Epworth score is 14 out of 24.   Physical Exam:  General - pleasant Eyes - pupils reactive ENT - no sinus tenderness, no oral exudate, no LAN, MP 3, high arched palate, 3+ tonsils Cardiac - regular, no murmur Chest - no wheeze, rales Abd - soft, non tender Ext - no edema Skin - no rashes Neuro - normal strength Psych - normal mood  Discussion: He has snoring, sleep disruption, witnessed apnea and daytime sleepiness.  I am concerned he could have sleep apnea.  We discussed how sleep apnea can  affect various health problems, including risks for hypertension, cardiovascular disease, and diabetes.  We also discussed how sleep disruption can increase risks for accidents, such as while driving.  Weight loss as a means of improving sleep apnea was also reviewed.  Additional treatment options discussed were CPAP therapy, oral appliance, and surgical intervention.  He works 3rd shift.  He is a smoker.  Assessment/Plan:  Suspected sleep apnea. - will arrange for home sleep study  Shift work. - discussed importance of maintaining regular sleep-wake schedule and consolidating his sleep  Tobacco abuse. - reviewed options to help quit smoking - he has sent New Years as his quit date and plans to quit on his own  Tonsillar hypertrophy. - might need further ENT evaluation if he is found to have sleep apnea and has trouble tolerating therapy   Patient Instructions  Will arrange for in sleep study Will call to arrange for follow up after sleep study reviewed     Chesley Mires, MD [place] 10/22/2017, 2:41 PM Pager:  [phone]  Flow Sheet  Pulmonary tests: PFT 04/26/17 >> FEV1 4.43 (90%), FEV1% 78, TLC 5.49 (63%), DLCO 77% CT chest 05/03/17 >> mild pleural thickening  Sleep tests:  Cardiac  tests:  Events:  Review of Systems: Constitutional: Negative for fever and unexpected weight change.  HENT: Positive for postnasal drip. Negative for congestion, dental problem, ear pain, nosebleeds, rhinorrhea, sinus pressure, sneezing, sore throat and trouble swallowing.   Eyes: Negative for redness and itching.  Respiratory: Positive for cough and shortness of breath. Negative for chest tightness and wheezing.   Cardiovascular: Negative for palpitations and leg swelling.  Gastrointestinal: Negative for nausea and vomiting.  Genitourinary: Negative for dysuria.  Musculoskeletal: Negative for joint swelling.  Skin: Negative for rash.  Allergic/Immunologic:  Positive for food allergies. Negative for environmental allergies and immunocompromised state.  Neurological: Negative for headaches.  Hematological: Does not bruise/bleed easily.  Psychiatric/Behavioral: Negative for dysphoric mood. The patient is not nervous/anxious.    Past Medical History: He  has a past medical history of Blood in stool, Diabetes mellitus without complication ([place]), GERD (gastroesophageal reflux disease), History of anal fissures, and Low sperm motility.  Past Surgical History: He  has a past surgical history that includes Rectal surgery (08/2010); Anal fistulectomy (2012); Anal fistulotomy (N/A, 06/29/2015); and Examination under anesthesia (N/A, 06/29/2015).  Family History: His family history includes Cirrhosis in his maternal grandfather; Diabetes in his mother; Hypertension in his mother; Throat cancer in his maternal aunt.  Social History: He  reports that he has been smoking cigarettes.  He has a 5.00 pack-year smoking history. he has never used smokeless tobacco. He reports that he drinks alcohol. He reports that he does not use drugs.  Medications: Allergies as of 10/22/2017      Reactions   Mushroom Extract Complex Swelling, Other (See Comments)   Other Lips   Shrimp [shellfish Allergy] Swelling   Tomato Anaphylaxis      Medication List        Accurate as of 10/22/17  2:41 PM. Always use your most recent med list.          atorvastatin 10 MG tablet Commonly known as:  LIPITOR Take 1 tablet (10 mg total) by mouth daily.   Fluticasone-Salmeterol 250-50 MCG/DOSE Aepb Commonly known as:  ADVAIR DISKUS Inhale 1 puff into the lungs 2 (two) times daily.   glucose blood test strip Commonly known as:  BAYER CONTOUR TEST Use to test blood sugar once daily   ibuprofen 800 MG tablet Commonly known as:  ADVIL,MOTRIN Take 1 tablet (800 mg total) by mouth every 8 (eight) hours as needed for headache, mild pain or moderate pain.   sildenafil 100 MG  tablet Commonly known as:  VIAGRA Take 1 tablet (100 mg total) by mouth daily as needed for erectile dysfunction.

## 2017-10-22 NOTE — Patient Instructions (Signed)
Will arrange for in sleep study Will call to arrange for follow up after sleep study reviewed

## 2017-10-22 NOTE — Progress Notes (Signed)
   Subjective:    Patient ID: Jonathan Taylor, male    DOB: [birth year], 31 y.o.   MRN: [id number]  HPI    Review of Systems  Constitutional: Negative for fever and unexpected weight change.  HENT: Positive for postnasal drip. Negative for congestion, dental problem, ear pain, nosebleeds, rhinorrhea, sinus pressure, sneezing, sore throat and trouble swallowing.   Eyes: Negative for redness and itching.  Respiratory: Positive for cough and shortness of breath. Negative for chest tightness and wheezing.   Cardiovascular: Negative for palpitations and leg swelling.  Gastrointestinal: Negative for nausea and vomiting.  Genitourinary: Negative for dysuria.  Musculoskeletal: Negative for joint swelling.  Skin: Negative for rash.  Allergic/Immunologic: Positive for food allergies. Negative for environmental allergies and immunocompromised state.  Neurological: Negative for headaches.  Hematological: Does not bruise/bleed easily.  Psychiatric/Behavioral: Negative for dysphoric mood. The patient is not nervous/anxious.        Objective:   Physical Exam        Assessment & Plan:

## 2017-11-16 DIAGNOSIS — G4733 Obstructive sleep apnea (adult) (pediatric): Secondary | ICD-10-CM | POA: Diagnosis not present

## 2017-11-20 ENCOUNTER — Telehealth: Payer: Self-pay | Admitting: Pulmonary Disease

## 2017-11-20 DIAGNOSIS — G4733 Obstructive sleep apnea (adult) (pediatric): Secondary | ICD-10-CM | POA: Diagnosis not present

## 2017-11-20 NOTE — Telephone Encounter (Signed)
HST 11/16/17 >> AHI 47.6, SaO2 low 80%.   Will have my nurse inform pt that sleep study shows severe sleep apnea.  Options are 1) CPAP now, 2) ROV first.  If He is agreeable to CPAP, then please send order for auto CPAP range 5 to 15 cm H2O with heated humidity and mask of choice.  Have download sent 1 month after starting CPAP and set up ROV 2 months after starting CPAP.  ROV can be with me or NP.

## 2017-11-22 ENCOUNTER — Other Ambulatory Visit: Payer: Self-pay | Admitting: *Deleted

## 2017-11-22 DIAGNOSIS — R29818 Other symptoms and signs involving the nervous system: Secondary | ICD-10-CM

## 2017-11-22 NOTE — Telephone Encounter (Signed)
Called and spoke with patient today regarding results and recommendations.  Informed the patient of results today. The patient verbalized understanding and denied any questions or concerns at this time.   Pt agreed to be placed on CPAP machine Placed order today for cpap machine auto CPAP range 5 to 15 cm H2O with heated humidity and mask of choice.   Have download sent 1 month after starting CPAP and set up ROV 2 months after starting CPAP.   ROV can be with VS or NP. Advised pt to call to schedule 51mo f/u after set up completed at home Pt advised would like appt with [facility] regarding sore throat/prod cough Scheduled appt for 12-10-2017 at 10am Nothing further needed.

## 2017-12-03 ENCOUNTER — Other Ambulatory Visit: Payer: Self-pay | Admitting: *Deleted

## 2017-12-03 DIAGNOSIS — H66002 Acute suppurative otitis media without spontaneous rupture of ear drum, left ear: Secondary | ICD-10-CM

## 2017-12-03 MED ORDER — SILDENAFIL CITRATE 100 MG PO TABS: 100.0000 mg | ORAL_TABLET | Freq: Every day | ORAL | 0 refills | Status: DC | PRN

## 2017-12-03 MED ORDER — IBUPROFEN 800 MG PO TABS: 800.0000 mg | ORAL_TABLET | Freq: Three times a day (TID) | ORAL | 0 refills | Status: DC | PRN

## 2017-12-03 NOTE — Telephone Encounter (Signed)
Patient requested. Sent to pharmacy.

## 2017-12-05 ENCOUNTER — Telehealth: Payer: Self-pay | Admitting: Pulmonary Disease

## 2017-12-05 NOTE — Telephone Encounter (Signed)
Spoke with pt. He is stating that [facility] is needing a copy home sleep study. Per our documentation, this was faxed to [facility] on 11/23/17. Advised the pt that I would fax this again. Nothing further was needed.

## 2017-12-10 ENCOUNTER — Ambulatory Visit: Payer: BLUE CROSS/BLUE SHIELD | Admitting: Pulmonary Disease

## 2017-12-19 ENCOUNTER — Telehealth: Payer: Self-pay | Admitting: Internal Medicine

## 2017-12-19 NOTE — Telephone Encounter (Signed)
Patient informed that Rx's were sent to pharmacy on 12/03/17. Patient will call pharmacy.

## 2017-12-19 NOTE — Telephone Encounter (Signed)
Pt called, need Rx called in to [facility] on [facility] for    refill for Viagra and Ibuprofen.Marland KitchenMarland KitchenInformed patient this message will be  Forwarded to clinical intake...[facility]

## 2017-12-31 ENCOUNTER — Ambulatory Visit: Payer: BLUE CROSS/BLUE SHIELD | Admitting: Pulmonary Disease

## 2018-01-25 ENCOUNTER — Other Ambulatory Visit: Payer: Self-pay

## 2018-01-25 ENCOUNTER — Encounter (HOSPITAL_COMMUNITY): Payer: Self-pay

## 2018-01-25 ENCOUNTER — Emergency Department (HOSPITAL_COMMUNITY)
Admission: EM | Admit: 2018-01-25 | Discharge: 2018-01-25 | Disposition: A | Discharge status: Home / Self Care | Payer: BLUE CROSS/BLUE SHIELD | Attending: Emergency Medicine | Admitting: Emergency Medicine

## 2018-01-25 ENCOUNTER — Emergency Department (HOSPITAL_COMMUNITY): Payer: BLUE CROSS/BLUE SHIELD

## 2018-01-25 DIAGNOSIS — M25562 Pain in left knee: Secondary | ICD-10-CM | POA: Insufficient documentation

## 2018-01-25 DIAGNOSIS — X509XXA Other and unspecified overexertion or strenuous movements or postures, initial encounter: Secondary | ICD-10-CM | POA: Insufficient documentation

## 2018-01-25 DIAGNOSIS — F17210 Nicotine dependence, cigarettes, uncomplicated: Secondary | ICD-10-CM | POA: Insufficient documentation

## 2018-01-25 DIAGNOSIS — Y999 Unspecified external cause status: Secondary | ICD-10-CM | POA: Diagnosis not present

## 2018-01-25 DIAGNOSIS — Z79899 Other long term (current) drug therapy: Secondary | ICD-10-CM | POA: Diagnosis not present

## 2018-01-25 DIAGNOSIS — I10 Essential (primary) hypertension: Secondary | ICD-10-CM | POA: Insufficient documentation

## 2018-01-25 DIAGNOSIS — Y929 Unspecified place or not applicable: Secondary | ICD-10-CM | POA: Diagnosis not present

## 2018-01-25 DIAGNOSIS — E119 Type 2 diabetes mellitus without complications: Secondary | ICD-10-CM | POA: Diagnosis not present

## 2018-01-25 DIAGNOSIS — R030 Elevated blood-pressure reading, without diagnosis of hypertension: Secondary | ICD-10-CM | POA: Insufficient documentation

## 2018-01-25 DIAGNOSIS — Y939 Activity, unspecified: Secondary | ICD-10-CM | POA: Insufficient documentation

## 2018-01-25 MED ORDER — MELOXICAM 15 MG PO TABS: 15.0000 mg | ORAL_TABLET | Freq: Every day | ORAL | 0 refills | Status: AC

## 2018-01-25 MED ORDER — ALBUTEROL SULFATE HFA 108 (90 BASE) MCG/ACT IN AERS: 1.0000 | INHALATION_SPRAY | Freq: Four times a day (QID) | RESPIRATORY_TRACT | 1 refills | Status: DC | PRN

## 2018-01-25 NOTE — Discharge Instructions (Addendum)
It was my pleasure taking care of you today!   Please see the information and instructions below regarding your visit.  Your diagnoses today include:  1. Left anterior knee pain   2. Elevated blood pressure reading     Tests performed today include: See side panel of your discharge paperwork for testing performed today. Vital signs are listed at the bottom of these instructions.   The x-ray of your knee showed that she had no fluid on the knee.  It did show that you have some mild degenerative changes consisting of small bony growths on the left and right side of the knee.  Medications prescribed:    Take any prescribed medications only as prescribed, and any over the counter medications only as directed on the packaging.  Diclofenac to reduce inflammation. You are prescribed Mobic, a non-steroidal anti-inflammatory agent (NSAID) for pain. You may take 15 mg daily. If still requiring this medication around the clock for acute pain after 10 days, please see your primary healthcare provider.  Women who are pregnant, breastfeeding, or planning on becoming pregnant should not take non-steroidal anti-inflammatories such as   You may combine this medication with Tylenol, 650 mg every 6 hours, so you are receiving something for pain every 3 hours.  This is not a long-term medication unless under the care and direction of your primary provider. Taking this medication long-term and not under the supervision of a healthcare provider could increase the risk of stomach ulcers, kidney problems, and cardiovascular problems such as high blood pressure.    Ice and elevate knee throughout the day.  Home care instructions:  Please follow any educational materials contained in this packet.   Follow-up instructions: Please follow-up with your primary care provider as soon as possible for further evaluation of your symptoms if they are not completely improved.   Call the orthopedist listed today or  tomorrow to schedule follow up appointment for recheck of ongoing knee pain in one to two weeks. That appointment can be canceled with a 24-48 hour notice if complete resolution of pain.   Follow up with the orthopedist listed if symptoms do not improve in one week.   Return instructions:  Please return to the Emergency Department if you experience worsening symptoms.  Please return for any increasing swelling, increasing pain, loss of color to the lower leg, or increasing redness. Please return if you have any other emergent concerns.  Additional Information:   Your vital signs today were: BP (!) 152/104 (BP Location: Right Arm)    Pulse 84    Temp 98.2 F (36.8 C) (Oral)    Resp 18    Ht 6' 6.5" (1.994 m)    Wt (!) 164.2 kg (362 lb)    SpO2 97%    BMI 41.30 kg/m  If your blood pressure (BP) was elevated on multiple readings during this visit above 130 for the top number or above 80 for the bottom number, please have this repeated by your primary care provider within one month. --------------

## 2018-01-25 NOTE — ED Triage Notes (Addendum)
Patient c/o intermittent left  knee pain x 3 weeks. patient states he feels like his left knee is hyperextending.

## 2018-01-25 NOTE — ED Provider Notes (Signed)
[facility] DEPT Provider Note   CSN: [id number] Arrival date & time: 01/25/18  1856     History   Chief Complaint Chief Complaint  Patient presents with  . Knee Pain    HPI Jonathan Taylor is a 32 y.o. male.  HPI  Patient is a 32 year old male with a history of type 2 diabetes mellitus (followed by endocrinology close (, and GERD presenting for left knee pain.  Patient reports that he twisted his left ankle approximately 4 weeks ago and while that has recovered, he continues to feel "popping" in his knee.  Patient is unclear with the injury that may have occurred to his knee at that time, as he reports he was drinking alcohol.  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 Patient denies any known swelling to the knee at that time.  Patient reports that his anterior, and medial and lateral to the patella.  Patient denies any distal lower extremity swelling or color change.  Patient denies any erythema or edema noted to the knee.  Patient has tried ice and ibuprofen, as well as icy hot application with minimal relief.  Past Medical History:  Diagnosis Date  . Blood in stool   . Diabetes mellitus without complication ([place])   . GERD (gastroesophageal reflux disease)   . History of anal fissures   . Low sperm motility     Patient Active Problem List   Diagnosis Date Noted  . OSA (obstructive sleep apnea) 11/20/2017  . Low testosterone in male 07/11/2017  . Snoring 07/11/2017  . Chest pain with low risk for cardiac etiology 04/29/2017  . Erectile dysfunction associated with type 2 diabetes mellitus ([place]) 10/02/2016  . Essential hypertension 10/02/2016  . Panic attacks 12/02/2015  . Sleep disturbance 10/07/2015  . Possible exposure to STD 10/07/2015  . Diabetes mellitus type 2 in obese ([place]) 03/14/2015  . Male fertility problem 03/14/2015  .  Obesity, morbid, BMI 40.0-49.9 ([place]) 11/17/2014  . Anal fistula 09/12/2011    Past Surgical History:  Procedure Laterality Date  . ANAL FISTULECTOMY  2012   with sphhincterotomy  . ANAL FISTULOTOMY N/A 06/29/2015   Procedure: EXCISION PERI RECTAL SCAR/FISTULA, INTERNAL HEMMORRHOIDAL LIGATION, PEXY, MARSUPIALIZATION;  Surgeon: Michael Boston, MD;  Location: [facility] ORS;  Service: General;  Laterality: N/A;  . EXAMINATION UNDER ANESTHESIA N/A 06/29/2015   Procedure: EXAM UNDER ANESTHESIA;  Surgeon: Michael Boston, MD;  Location: [facility] ORS;  Service: General;  Laterality: N/A;  . RECTAL SURGERY  08/2010   Dr Zenia Resides       Home Medications    Prior to Admission medications   Medication Sig Start Date End Date Taking? Authorizing Provider  ibuprofen (ADVIL,MOTRIN) 800 MG tablet Take 1 tablet (800 mg total) by mouth every 8 (eight) hours as needed for headache, mild pain or moderate pain. 12/03/17  Yes Gildardo Cranker, DO  Multiple Vitamin (MULTI-VITAMINS) TABS Take 1 tablet by mouth daily.   Yes [provider]  sildenafil (VIAGRA) 100 MG tablet Take 1 tablet (100 mg total) by mouth daily as needed for erectile dysfunction. 12/03/17  Yes Eulas Post, Monica, DO  atorvastatin (LIPITOR) 10 MG tablet Take 1 tablet (10 mg total) by mouth daily. Patient not taking: Reported on 01/25/2018 07/13/17 07/13/18  Gildardo Cranker, DO  Fluticasone-Salmeterol (ADVAIR DISKUS) 250-50 MCG/DOSE AEPB Inhale 1 puff into the lungs  2 (two) times daily. 07/10/17   Gildardo Cranker, DO  glucose blood (BAYER CONTOUR TEST) test strip Use to test blood sugar once daily 11/16/16   Elayne Snare, MD    Family History Family History  Problem Relation Age of Onset  . Diabetes Mother   . Hypertension Mother   . Throat cancer Maternal Aunt   . Cirrhosis Maternal Grandfather   . Colon cancer Neg Hx   . Colon polyps Neg Hx   . Kidney disease Neg Hx     Social History Social History   Tobacco Use  . Smoking status: Current Every Day  Smoker    Packs/day: 0.15    Years: 10.00    Pack years: 1.50    Types: Cigarettes    Last attempt to quit: 05/26/2015    Years since quitting: 2.6  . Smokeless tobacco: Never Used  . Tobacco comment: Pt given handout  Substance Use Topics  . Alcohol use: Yes    Comment: Occassionally  . Drug use: No     Allergies   Mushroom extract complex; Shrimp [shellfish allergy]; and Tomato   Review of Systems Review of Systems  Cardiovascular: Negative for leg swelling.  Musculoskeletal: Positive for arthralgias. Negative for joint swelling.  Skin: Negative for color change.  Neurological: Negative for weakness and numbness.     Physical Exam Updated Vital Signs BP (!) 152/104 (BP Location: Right Arm)   Pulse 84   Temp 98.2 F (36.8 C) (Oral)   Resp 18   Ht 6' 6.5" (1.994 m)   Wt (!) 164.2 kg (362 lb)   SpO2 97%   BMI 41.30 kg/m   Physical Exam  Constitutional: He appears well-developed and well-nourished. No distress.  Sitting comfortably in bed.  HENT:  Head: Normocephalic and atraumatic.  Eyes: Conjunctivae are normal. Right eye exhibits no discharge. Left eye exhibits no discharge.  EOMs normal to gross examination.  Neck: Normal range of motion.  Cardiovascular: Normal rate and regular rhythm.  Intact, 2+ DP and PT pulse of LLE  Pulmonary/Chest:  Normal respiratory effort. Patient converses comfortably. No audible wheeze or stridor.  Abdominal: He exhibits no distension.  Musculoskeletal:  Left knee with tenderness to palpation of medial and lateral patella. No popliteal TTP. Full ROM.No joint line tenderness. No joint effusion or swelling appreciated. No abnormal alignment or patellar mobility. No bruising, erythema or warmth overlaying the joint. No varus/valgus laxity. Negative drawer's, Lachman's and McMurray's.  No crepitus.  2+ DP pulses bilaterally. All compartments are soft. Sensation intact distal to injury.  Neurological: He is alert.  Cranial nerves  intact to gross observation. Patient moves extremities without difficulty.  Skin: Skin is warm and dry. He is not diaphoretic.  Psychiatric: He has a normal mood and affect. His behavior is normal. Judgment and thought content normal.  Nursing note and vitals reviewed.    ED Treatments / Results  Labs (all labs ordered are listed, but only abnormal results are displayed) Labs Reviewed - No data to display  EKG  EKG Interpretation None       Radiology Dg Knee Complete 4 Views Left  Result Date: 01/25/2018 CLINICAL DATA:  Intermittent right knee pain for 3 weeks. No known injury. EXAM: LEFT KNEE - COMPLETE 4+ VIEW COMPARISON:  None. FINDINGS: No acute bony or joint abnormality is identified. No joint effusion. Mild osteophytosis is seen about the medial and lateral compartments. Small spur off the superior pole of patella noted. IMPRESSION: No acute abnormality. Mild  degenerative change. Electronically Signed   By: Inge Rise M.D.   On: 01/25/2018 10:09    Procedures Procedures (including critical care time)  Medications Ordered in ED Medications - No data to display   Initial Impression / Assessment and Plan / ED Course  I have reviewed the triage vital signs and the nursing notes.  Pertinent labs & imaging results that were available during my care of the patient were reviewed by me and considered in my medical decision making (see chart for details).     Patient well-appearing and neurovascularly intact in the left lower extremity.  No popliteal tenderness or calf tenderness to suggest DVT.  Symptomatically, do suspect meniscal tear.  MCL, LCL, and ACL and PCL are intact per examination, and history not consistent with these injuries.  Instructed patient to take Mobic for 7 days, rest of the knee, ice, elevate, and compress.  Patient instructed to follow-up as soon as possible with orthopedics.  Patient is in understanding and agrees with plan of care.  Final Clinical  Impressions(s) / ED Diagnoses   Final diagnoses:  Left anterior knee pain  Elevated blood pressure reading    ED Discharge Orders        Ordered    meloxicam (MOBIC) 15 MG tablet  Daily     01/25/18 1055    albuterol (PROVENTIL HFA;VENTOLIN HFA) 108 (90 Base) MCG/ACT inhaler  Every 6 hours PRN     01/25/18 1101       Tamala Julian 01/25/18 1808    Davonna Belling, MD 01/28/18 2143

## 2018-01-25 NOTE — ED Notes (Signed)
Pt has been given an ice pack at this time.

## 2018-01-29 ENCOUNTER — Other Ambulatory Visit: Payer: Self-pay | Admitting: Orthopaedic Surgery

## 2018-01-29 DIAGNOSIS — M25562 Pain in left knee: Secondary | ICD-10-CM

## 2018-01-30 ENCOUNTER — Ambulatory Visit: Payer: BLUE CROSS/BLUE SHIELD | Admitting: Pulmonary Disease

## 2018-01-30 ENCOUNTER — Encounter: Payer: Self-pay | Admitting: Pulmonary Disease

## 2018-01-30 VITALS — BP 134/90 | HR 85 | Ht 78.5 in | Wt 372.0 lb

## 2018-01-30 DIAGNOSIS — F17200 Nicotine dependence, unspecified, uncomplicated: Secondary | ICD-10-CM

## 2018-01-30 DIAGNOSIS — G4726 Circadian rhythm sleep disorder, shift work type: Secondary | ICD-10-CM | POA: Diagnosis not present

## 2018-01-30 DIAGNOSIS — Z716 Tobacco abuse counseling: Secondary | ICD-10-CM

## 2018-01-30 DIAGNOSIS — Z6841 Body Mass Index (BMI) 40.0 and over, adult: Secondary | ICD-10-CM

## 2018-01-30 DIAGNOSIS — G4733 Obstructive sleep apnea (adult) (pediatric): Secondary | ICD-10-CM

## 2018-01-30 NOTE — Progress Notes (Signed)
[facility]  Chief Complaint  Patient presents with  . Sleep Apnea    follow up with new CPAP     Vital signs: BP 134/90 (BP Location: Left Arm, Cuff Size: Large)   Pulse 85   Ht 6' 6.5" (1.994 m)   Wt (!) 372 lb (168.7 kg)   SpO2 98%   BMI 42.44 kg/m   History of Present Illness: Jonathan Taylor is a 32 y.o. male with obstructive sleep apnea.  He had home sleep study in January.  This showed severe sleep apnea.  He uses CPAP as much as he can.  He has trouble keeping up with CPAP at times due to his work schedule.  He feels the pressure is okay, and no issue with mask fit.  He continues to smoke.  He is down to 2 cigarettes per day.  This happens when he is at working because he has too much stress from work.  He gets Strep throat about 4 to 6 times per year.  He was on antibiotics again recently.   Physical Exam:  General - pleasant Eyes - pupils reactive ENT - no sinus tenderness, no oral exudate, no LAN, MP 3, 2+ tonsils Cardiac - regular, no murmur Chest - no wheeze, rales Abd - soft, non tender Ext - no edema Skin - no rashes Neuro - normal strength Psych - normal mood  Assessment/Plan:  Obstructive sleep apnea. - We discussed how sleep apnea can affect various health problems, including risks for hypertension, cardiovascular disease, and diabetes.  We also discussed how sleep disruption can increase risks for accidents, such as while driving.  Weight loss as a means of improving sleep apnea was also reviewed.  Additional treatment options discussed were CPAP therapy, oral appliance, and surgical intervention. - he reports benefit from CPAP - continue auto CPAP  Shift work. - reviewed proper sleep hygiene  Tobacco abuse. - encouraged him to keep up with his smoking cessation plan  Tonsillar hypertrophy. - gets frequent episodes of strep throat - might need ENT evaluation, especially if he has trouble tolerating  CPAP   Patient Instructions  Follow up in 6 months  Time spent 28 minutes  Chesley Mires, MD [place] 01/30/2018, 11:12 AM Pager:  [phone]  Flow Sheet  Pulmonary tests: PFT 04/26/17 >> FEV1 4.43 (90%), FEV1% 78, TLC 5.49 (63%), DLCO 77% CT chest 05/03/17 >> mild pleural thickening  Sleep tests: HST 11/16/17 >> AHI 47.6, SaO2 low 80%. Auto CPAP 12/30/17 to 01/28/18 >> used on 23 of 30 nights with average 3 hours.  Average AHI 5.3 with median CPAP 8 and 95 th percentile CPAP 11 cm H2O.  Past Medical History: He  has a past medical history of Blood in stool, Diabetes mellitus without complication ([place]), GERD (gastroesophageal reflux disease), History of anal fissures, and Low sperm motility.  Past Surgical History: He  has a past surgical history that includes Rectal surgery (08/2010); Anal fistulectomy (2012); Anal fistulotomy (N/A, 06/29/2015); and Examination under anesthesia (N/A, 06/29/2015).  Family History: His family history includes Cirrhosis in his maternal grandfather; Diabetes in his mother; Hypertension in his mother; Throat cancer in his maternal aunt.  Social History: He  reports that he has been smoking cigarettes.  He has a 1.50 pack-year smoking history. He has never used smokeless tobacco. He reports that he drinks alcohol. He reports that he does not use drugs.  Medications: Allergies as of 01/30/2018  Reactions   Mushroom Extract Complex Anaphylaxis, Swelling   Lips   Shrimp [shellfish Allergy] Anaphylaxis   Tomato Anaphylaxis      Medication List        Accurate as of 01/30/18 11:12 AM. Always use your most recent med list.          albuterol 108 (90 Base) MCG/ACT inhaler Commonly known as:  PROVENTIL HFA;VENTOLIN HFA Inhale 1-2 puffs into the lungs every 6 (six) hours as needed for wheezing or shortness of breath.   atorvastatin 10 MG tablet Commonly known as:  LIPITOR Take 1 tablet (10 mg total) by mouth daily.    Fluticasone-Salmeterol 250-50 MCG/DOSE Aepb Commonly known as:  ADVAIR DISKUS Inhale 1 puff into the lungs 2 (two) times daily.   glucose blood test strip Commonly known as:  BAYER CONTOUR TEST Use to test blood sugar once daily   ibuprofen 800 MG tablet Commonly known as:  ADVIL,MOTRIN Take 1 tablet (800 mg total) by mouth every 8 (eight) hours as needed for headache, mild pain or moderate pain.   meloxicam 15 MG tablet Commonly known as:  MOBIC Take 1 tablet (15 mg total) by mouth daily for 7 days.   MULTI-VITAMINS Tabs Take 1 tablet by mouth daily.   sildenafil 100 MG tablet Commonly known as:  VIAGRA Take 1 tablet (100 mg total) by mouth daily as needed for erectile dysfunction.

## 2018-01-30 NOTE — Patient Instructions (Signed)
Follow up in 6 months 

## 2018-02-01 ENCOUNTER — Encounter: Payer: Self-pay | Admitting: Internal Medicine

## 2018-02-01 ENCOUNTER — Ambulatory Visit: Payer: BLUE CROSS/BLUE SHIELD | Admitting: Internal Medicine

## 2018-02-01 VITALS — BP 134/92 | HR 83 | Temp 98.0°F | Ht 73.5 in | Wt 371.8 lb

## 2018-02-01 DIAGNOSIS — N529 Male erectile dysfunction, unspecified: Secondary | ICD-10-CM | POA: Diagnosis not present

## 2018-02-01 DIAGNOSIS — E1169 Type 2 diabetes mellitus with other specified complication: Secondary | ICD-10-CM

## 2018-02-01 DIAGNOSIS — R269 Unspecified abnormalities of gait and mobility: Secondary | ICD-10-CM | POA: Diagnosis not present

## 2018-02-01 DIAGNOSIS — E669 Obesity, unspecified: Secondary | ICD-10-CM | POA: Diagnosis not present

## 2018-02-01 DIAGNOSIS — M25562 Pain in left knee: Secondary | ICD-10-CM

## 2018-02-01 MED ORDER — SILDENAFIL CITRATE 100 MG PO TABS: 100.0000 mg | ORAL_TABLET | Freq: Every day | ORAL | 0 refills | Status: DC | PRN

## 2018-02-01 MED ORDER — HYDROCODONE-ACETAMINOPHEN 5-325 MG PO TABS: 1.0000 | ORAL_TABLET | Freq: Four times a day (QID) | ORAL | 0 refills | Status: DC | PRN

## 2018-02-01 NOTE — Progress Notes (Signed)
Patient ID: Jonathan Taylor, male   DOB: [birth year], 32 y.o.   MRN: [id number]   [facility] OFFICE  Provider: DR Arletha Grippe  Code Status: FULL CODE Goals of Care:  Advanced Directives 08/21/2017  Does Patient Have a Medical Advance Directive? No  Would patient like information on creating a medical advance directive? -     Chief Complaint  Patient presents with  . Acute Visit    Knee pain x 3 weeks (pending MRI on Sunday) and discuss weight loss surgery   . Medication Refill    Ibuprofen and Viagra   . Health Maintenance    Last eye exam 12/2017 ([facility]), Foot Exam Due     HPI: Patient is a 32 y.o. male seen today for an acute visit for obesity and knee pain. He has been unable to lose adequate weight despite increased exercise and healthy diet in the last yr and is interested in weight loss surgery. BMI 48.39  Left knee pain severe and was seen by Ortho Dr ? and is scheduled for MRI left knee this weekend. He was told he needs surgical repair of meniscal tear. He initially was seen in the ED on 01/25/18 and referred to ortho. He is currently out-of-work due to knee pain as he is unable to perform nml job duties and cannot bear weight on left leg  He has had ED issues and stopped taking lipitor. viagra occasionally ineffective.  Past Medical History:  Diagnosis Date  . Blood in stool   . Diabetes mellitus without complication ([place])   . GERD (gastroesophageal reflux disease)   . History of anal fissures   . Low sperm motility     Past Surgical History:  Procedure Laterality Date  . ANAL FISTULECTOMY  2012   with sphhincterotomy  . ANAL FISTULOTOMY N/A 06/29/2015   Procedure: EXCISION PERI RECTAL SCAR/FISTULA, INTERNAL HEMMORRHOIDAL LIGATION, PEXY, MARSUPIALIZATION;  Surgeon: Michael Boston, MD;  Location: [facility] ORS;  Service: General;  Laterality: N/A;  . EXAMINATION UNDER ANESTHESIA N/A 06/29/2015   Procedure: EXAM UNDER ANESTHESIA;  Surgeon: Michael Boston, MD;  Location: [facility]  ORS;  Service: General;  Laterality: N/A;  . RECTAL SURGERY  08/2010   Dr Zenia Resides     reports that he has been smoking cigarettes.  He has a 1.50 pack-year smoking history. He has never used smokeless tobacco. He reports that he drinks alcohol. He reports that he does not use drugs. Social History   Socioeconomic History  . Marital status: Married    Spouse name: Not on file  . Number of children: 0  . Years of education: Not on file  . Highest education level: Not on file  Occupational History  . Occupation: Glass blower/designer; Training and development officer  Social Needs  . Financial resource strain: Not on file  . Food insecurity:    Worry: Not on file    Inability: Not on file  . Transportation needs:    Medical: Not on file    Non-medical: Not on file  Tobacco Use  . Smoking status: Current Every Day Smoker    Packs/day: 0.15    Years: 10.00    Pack years: 1.50    Types: Cigarettes    Last attempt to quit: 05/26/2015    Years since quitting: 2.6  . Smokeless tobacco: Never Used  . Tobacco comment: Pt given handout  Substance and Sexual Activity  . Alcohol use: Yes    Comment: Occassionally  . Drug use: No  . Sexual  activity: Yes  Lifestyle  . Physical activity:    Days per week: Not on file    Minutes per session: Not on file  . Stress: Not on file  Relationships  . Social connections:    Talks on phone: Not on file    Gets together: Not on file    Attends religious service: Not on file    Active member of club or organization: Not on file    Attends meetings of clubs or organizations: Not on file    Relationship status: Not on file  . Intimate partner violence:    Fear of current or ex partner: Not on file    Emotionally abused: Not on file    Physically abused: Not on file    Forced sexual activity: Not on file  Other Topics Concern  . Not on file  Social History Narrative   Diet? Low carb meat/veggies water      Do you drink/eat things with caffeine? sometimes       Marital status?        married                            What year were you married? 2015      Do you live in a house, apartment, assisted living, condo, trailer, etc.? townhouse      Is it one or more stories? 2 story      How many persons live in your home? 3      Do you have any pets in your home? (please list) yes 1 dog      Current or past profession: forklift/machine operator      Do you exercise?       Only at work                               Type & how often? daily      Do you have a living will? n/a      Do you have a DNR form? n/a                                 If not, do you want to discuss one?      Do you have signed POA/HPOA for forms?  n/a    Family History  Problem Relation Age of Onset  . Diabetes Mother   . Hypertension Mother   . Throat cancer Maternal Aunt   . Cirrhosis Maternal Grandfather   . Colon cancer Neg Hx   . Colon polyps Neg Hx   . Kidney disease Neg Hx     Allergies  Allergen Reactions  . Mushroom Extract Complex Anaphylaxis and Swelling    Lips  . Shrimp [Shellfish Allergy] Anaphylaxis  . Tomato Anaphylaxis    Outpatient Encounter Medications as of 02/01/2018  Medication Sig  . albuterol (PROVENTIL HFA;VENTOLIN HFA) 108 (90 Base) MCG/ACT inhaler Inhale 1-2 puffs into the lungs every 6 (six) hours as needed for wheezing or shortness of breath.  . Fluticasone-Salmeterol (ADVAIR DISKUS) 250-50 MCG/DOSE AEPB Inhale 1 puff into the lungs 2 (two) times daily.  Marland Kitchen glucose blood (BAYER CONTOUR TEST) test strip Use to test blood sugar once daily  . meloxicam (MOBIC) 15 MG tablet Take 1 tablet (15 mg total) by mouth daily for 7 days.  Marland Kitchen  Multiple Vitamin (MULTI-VITAMINS) TABS Take 1 tablet by mouth daily.  . [DISCONTINUED] ibuprofen (ADVIL,MOTRIN) 800 MG tablet Take 1 tablet (800 mg total) by mouth every 8 (eight) hours as needed for headache, mild pain or moderate pain.  . [DISCONTINUED] sildenafil (VIAGRA) 100 MG tablet Take 1 tablet (100 mg  total) by mouth daily as needed for erectile dysfunction.  Marland Kitchen atorvastatin (LIPITOR) 10 MG tablet Take 1 tablet (10 mg total) by mouth daily. (Patient not taking: Reported on 02/01/2018)  . HYDROcodone-acetaminophen (NORCO) 5-325 MG tablet Take 1 tablet by mouth every 6 (six) hours as needed for moderate pain.  . sildenafil (VIAGRA) 100 MG tablet Take 1 tablet (100 mg total) by mouth daily as needed for erectile dysfunction.   No facility-administered encounter medications on file as of 02/01/2018.     Review of Systems:  Review of Systems  Musculoskeletal: Positive for arthralgias, gait problem and joint swelling.  All other systems reviewed and are negative.   Health Maintenance  Topic Date Due  . FOOT EXAM  10/02/2017  . HEMOGLOBIN A1C  01/07/2018  . INFLUENZA VACCINE  06/24/2018 (Originally 06/06/2017)  . URINE MICROALBUMIN  07/10/2018  . OPHTHALMOLOGY EXAM  12/31/2018  . PNEUMOCOCCAL POLYSACCHARIDE VACCINE (2) 09/12/2021  . TETANUS/TDAP  11/16/2024  . HIV Screening  Completed    Physical Exam: Vitals:   02/01/18 1037  BP: (!) 134/92  Pulse: 83  Temp: 98 F (36.7 C)  TempSrc: Oral  SpO2: 98%  Weight: (!) 371 lb 12.8 oz (168.6 kg)  Height: 6' 1.5" (1.867 m)   Body mass index is 48.39 kg/m. Physical Exam  Constitutional: He is oriented to person, place, and time. He appears well-developed and well-nourished.  HENT:  Mouth/Throat: Oropharynx is clear and moist.  MMM; no oral thrush  Eyes: Pupils are equal, round, and reactive to light. No scleral icterus.  Neck: Neck supple. Carotid bruit is not present. No thyromegaly present.  Cardiovascular: Normal rate, regular rhythm, normal heart sounds and intact distal pulses. Exam reveals no gallop and no friction rub.  No murmur heard. No distal LE edema. No calf TTP  Pulmonary/Chest: Effort normal and breath sounds normal. He has no wheezes. He has no rales. He exhibits no tenderness.  Abdominal: Soft. Normal appearance and  bowel sounds are normal. He exhibits no distension, no abdominal bruit, no pulsatile midline mass and no mass. There is no hepatomegaly. There is no tenderness. There is no rigidity, no rebound and no guarding. No hernia.  obese  Musculoskeletal: He exhibits edema and tenderness.       Left knee: He exhibits decreased range of motion and swelling.  Lymphadenopathy:    He has no cervical adenopathy.  Neurological: He is alert and oriented to person, place, and time. He has normal reflexes.  Skin: Skin is warm and dry. No rash noted.  Psychiatric: He has a normal mood and affect. His behavior is normal. Judgment and thought content normal.    Labs reviewed: Basic Metabolic Panel: Recent Labs    07/10/17 0412 08/21/17 1407  NA 139 138  K 4.4 4.0  CL 105 106  CO2 22 23  GLUCOSE 124* 147*  BUN 12 14  CREATININE 0.86 0.92  CALCIUM 9.1 9.7  TSH  --  1.54   Liver Function Tests: Recent Labs    07/10/17 0412 08/21/17 1407  AST 22  --   ALT 33 34  ALKPHOS 87  --   BILITOT 0.3  --   PROT  6.8  --   ALBUMIN 4.1  --    No results for input(s): LIPASE, AMYLASE in the last 8760 hours. No results for input(s): AMMONIA in the last 8760 hours. CBC: Recent Labs    08/21/17 1407  WBC 6.5  NEUTROABS 3,556  HGB 13.2  HCT 39.1  MCV 76.7*  PLT 297   Lipid Panel: Recent Labs    07/10/17 0412  CHOL 156  HDL 45  LDLCALC 100*  TRIG 55  CHOLHDL 3.5   Lab Results  Component Value Date   HGBA1C 7.2 (H) 07/10/2017    Procedures since last visit: Dg Knee Complete 4 Views Left  Result Date: 01/25/2018 CLINICAL DATA:  Intermittent right knee pain for 3 weeks. No known injury. EXAM: LEFT KNEE - COMPLETE 4+ VIEW COMPARISON:  None. FINDINGS: No acute bony or joint abnormality is identified. No joint effusion. Mild osteophytosis is seen about the medial and lateral compartments. Small spur off the superior pole of patella noted. IMPRESSION: No acute abnormality. Mild degenerative  change. Electronically Signed   By: Inge Rise M.D.   On: 01/25/2018 10:09    Assessment/Plan   ICD-10-CM   1. Acute pain of left knee M25.562 HYDROcodone-acetaminophen (NORCO) 5-325 MG tablet  2. Gait abnormality R26.9    2/2 knee pain  3. Morbid obesity ([place]) E66.01 Amb Referral to Bariatric Surgery  4. Erectile dysfunction, unspecified erectile dysfunction type N52.9 sildenafil (VIAGRA) 100 MG tablet   likely 2/2 weight +/- med induced (statin)  5. Diabetes mellitus type 2 in obese ([facility]) E11.69 Amb Referral to Bariatric Surgery   E66.9     Continue left knee brace as ordered and follow up with ortho as scheduled  START NORCO 5/325 EVERY 6 HRS AS NEEDED FOR SEVERE PAIN  STOP IBUPROFEN  Continue other medications as ordered  May need ice/heat alternating as needed for pain and swelling  Follow up in 3 mos for CPE. Will need labs at [facility] [address]  [facility] [address] [place], [place] [address] [phone] Cell (Monday-Friday 8 AM - 5 PM) [phone] After 5 PM and follow prompts

## 2018-02-01 NOTE — Patient Instructions (Addendum)
Continue left knee brace as ordered and follow up with ortho as scheduled  START [place] 6 HRS AS NEEDED FOR SEVERE PAIN  Continue other medications as ordered  May need ice/heat alternating as needed for pain and swelling  Follow up in 3 mos for CPE

## 2018-02-03 ENCOUNTER — Ambulatory Visit
Admission: RE | Admit: 2018-02-03 | Discharge: 2018-02-03 | Disposition: A | Discharge status: Home / Self Care | Payer: BLUE CROSS/BLUE SHIELD | Source: Ambulatory Visit | Attending: Orthopaedic Surgery | Admitting: Orthopaedic Surgery

## 2018-02-03 DIAGNOSIS — M25562 Pain in left knee: Secondary | ICD-10-CM

## 2018-04-14 ENCOUNTER — Other Ambulatory Visit: Payer: Self-pay | Admitting: Internal Medicine

## 2018-04-14 DIAGNOSIS — I10 Essential (primary) hypertension: Secondary | ICD-10-CM

## 2018-04-14 DIAGNOSIS — E669 Obesity, unspecified: Principal | ICD-10-CM

## 2018-04-14 DIAGNOSIS — E1169 Type 2 diabetes mellitus with other specified complication: Secondary | ICD-10-CM

## 2018-05-02 ENCOUNTER — Other Ambulatory Visit: Payer: BLUE CROSS/BLUE SHIELD

## 2018-05-07 ENCOUNTER — Encounter: Payer: Self-pay | Admitting: Internal Medicine

## 2018-05-07 ENCOUNTER — Other Ambulatory Visit: Payer: Self-pay | Admitting: *Deleted

## 2018-05-07 DIAGNOSIS — M25562 Pain in left knee: Secondary | ICD-10-CM

## 2018-05-07 MED ORDER — ATORVASTATIN CALCIUM 10 MG PO TABS: 10.0000 mg | ORAL_TABLET | Freq: Every day | ORAL | 3 refills | Status: DC

## 2018-05-07 MED ORDER — HYDROCODONE-ACETAMINOPHEN 5-325 MG PO TABS: 1.0000 | ORAL_TABLET | Freq: Four times a day (QID) | ORAL | 0 refills | Status: DC | PRN

## 2018-05-07 NOTE — Telephone Encounter (Signed)
Patient walked in asking for refill of Mobic (not on med list) Lipitor and Hydrocodone( per database last filled 02/01/18. Please advise on refills

## 2018-06-26 ENCOUNTER — Encounter: Payer: Self-pay | Admitting: Internal Medicine

## 2018-07-07 HISTORY — PX: KNEE SURGERY: SHX244

## 2018-07-28 NOTE — Progress Notes (Signed)
Patient ID: Jonathan Taylor, male   DOB: 12/30/1985, 32 y.o.   MRN: [id number]            Reason for Appointment: Follow-up for Type 2 Diabetes    History of Present Illness:          Date of diagnosis of type 2 diabetes mellitus: 11/2014        Background history:   Appears to have been diagnosed on routine lab work with baseline A1c of 7.2 He had been prescribed metformin since then but has not been able to tolerate this because of diarrhea and abdominal cramps He has previously been irregular with treatment and follow-up because of periodically financial issues He was also prescribed Trulicity in 06/2992 when his A1c was 6.9 but he did not take this  Recent history:    Non-insulin hypoglycemic drugs the patient is taking are: None, was previously on Metformin 500 bid  Last A1c was 6.9 prior to his last visit in 1/18 and now 7.2  Current management, blood sugar patterns and problems identified:  He has not been seen in follow-up for over a year and a half  Again he did not bring his monitor for download  Appears not to be remembering what his blood sugars are; although he thinks his blood sugars are good at home his highest reading has been over 200 also at home  His metformin was stopped on his initial visit because of side effects  He has however he recently started a diet with smoothies and cutting out high fat foods and appears to be losing weight the last few months  Previously recommended consultation with dietitian but he has not done this  However has not started any exercise program and is just getting over arthroscopy for his knee       Side effects from medications have been: Diarrhea and abdominal cramps with metformin  Compliance with the medical regimen: Fair  Glucose monitoring:  using contour meter  Occasionally, probable numbers by recall: 125-221, mostly fasting      Self-care: The diet that the patient has been following is: tries to limit drinks with  sugar     Typical meal intake: Breakfast is eggs and sausage, dinner variable                Dietician visit, most recent: Never               Exercise: none   Weight history: Previous range 340-377  Wt Readings from Last 3 Encounters:  07/29/18 (!) 356 lb (161.5 kg)  02/01/18 (!) 371 lb 12.8 oz (168.6 kg)  01/30/18 (!) 372 lb (168.7 kg)    Glycemic control:   Lab Results  Component Value Date   HGBA1C 7.2 (A) 07/29/2018   HGBA1C 7.2 (H) 07/10/2017   HGBA1C 7.5 (H) 01/12/2017   Lab Results  Component Value Date   MICROALBUR 3.6 07/10/2017   LDLCALC 100 (H) 07/10/2017   CREATININE 0.92 08/21/2017   Lab Results  Component Value Date   MICRALBCREAT 11 07/10/2017   Office Visit on 07/29/2018  Component Date Value Ref Range Status  . Hemoglobin A1C 07/29/2018 7.2* 4.0 - 5.6 % Final  . POC Glucose 07/29/2018 221* 70 - 99 mg/dl Final    Other active problems: See review of systems    Allergies as of 07/29/2018      Reactions   Mushroom Extract Complex Anaphylaxis, Swelling   Lips   Shrimp [shellfish Allergy] Anaphylaxis  Tomato Anaphylaxis      Medication List        Accurate as of 07/29/18 11:26 AM. Always use your most recent med list.          albuterol 108 (90 Base) MCG/ACT inhaler Commonly known as:  PROVENTIL HFA;VENTOLIN HFA Inhale 1-2 puffs into the lungs every 6 (six) hours as needed for wheezing or shortness of breath.   atorvastatin 10 MG tablet Commonly known as:  LIPITOR Take 1 tablet (10 mg total) by mouth daily.   empagliflozin 10 MG Tabs tablet Commonly known as:  JARDIANCE Take 10 mg by mouth daily with breakfast.   Fluticasone-Salmeterol 250-50 MCG/DOSE Aepb Commonly known as:  ADVAIR Inhale 1 puff into the lungs 2 (two) times daily.   glucose blood test strip Use to test blood sugar once daily   HYDROcodone-acetaminophen 5-325 MG tablet Commonly known as:  NORCO/VICODIN Take 1 tablet by mouth every 6 (six) hours as needed for  moderate pain.   MULTI-VITAMINS Tabs Take 1 tablet by mouth daily.   sildenafil 100 MG tablet Commonly known as:  VIAGRA Take 1 tablet (100 mg total) by mouth daily as needed for erectile dysfunction.       Allergies:  Allergies  Allergen Reactions  . Mushroom Extract Complex Anaphylaxis and Swelling    Lips  . Shrimp [Shellfish Allergy] Anaphylaxis  . Tomato Anaphylaxis    Past Medical History:  Diagnosis Date  . Blood in stool   . Diabetes mellitus without complication ([place])   . GERD (gastroesophageal reflux disease)   . History of anal fissures   . Low sperm motility     Past Surgical History:  Procedure Laterality Date  . ANAL FISTULECTOMY  2012   with sphhincterotomy  . ANAL FISTULOTOMY N/A 06/29/2015   Procedure: EXCISION PERI RECTAL SCAR/FISTULA, INTERNAL HEMMORRHOIDAL LIGATION, PEXY, MARSUPIALIZATION;  Surgeon: Michael Boston, MD;  Location: [facility] OR[facility];  Service: General;  Laterality: N/A;  . EXAMINATION UNDER ANESTHESIA N/A 06/29/2015   Procedure: EXAM UNDER ANESTHESIA;  Surgeon: Michael Boston, MD;  Location: [facility];  Service: General;  Laterality: N/A;  . RECTAL SURGERY  08/2010   Dr Zenia Resides    Family History  Problem Relation Age of Onset  . Diabetes Mother   . Hypertension Mother   . Throat cancer Maternal Aunt   . Cirrhosis Maternal Grandfather   . [place] cancer Neg Hx   . Colon polyps Neg Hx   . Kidney disease Neg Hx     Social History:  reports that he has been smoking cigarettes. He has a 1.50 pack-year smoking history. He has never used smokeless tobacco. He reports that he drinks alcohol. He reports that he does not use drugs.   Review of Systems   Lipid history: No significant hyperlipidemia previously, needs follow-up    Lab Results  Component Value Date   CHOL 156 07/10/2017   HDL 45 07/10/2017   LDLCALC 100 (H) 07/10/2017   TRIG 55 07/10/2017   CHOLHDL 3.5 07/10/2017           Hypertension:Has not been told to have hypertension or  treated for this Blood pressure appears to be consistently high, he thinks it may be higher today because of his knee pain from recent surgery He is very reluctant to consider medications as he thinks this would affect his erectile function  BP Readings from Last 3 Encounters:  07/29/18 (!) 138/92  02/01/18 (!) 134/92  01/30/18 134/90    Most recent  eye exam, 2/19  Most recent foot exam: 07/2018 No symptoms of numbness or tingling in his feet but he is concerned about darkening of his left second toe distally  Has history of erectile dysfunction treated with Viagra  LABS:  Office Visit on 07/29/2018  Component Date Value Ref Range Status  . Hemoglobin A1C 07/29/2018 7.2* 4.0 - 5.6 % Final  . POC Glucose 07/29/2018 221* 70 - 99 mg/dl Final    Physical Examination:  BP (!) 138/92 (BP Location: Left Arm, Patient Position: Sitting, Cuff Size: Large)   Pulse 62   Ht 6\' 4"  (1.93 m)   Wt (!) 356 lb (161.5 kg)   SpO2 97%   BMI 43.33 kg/m     Diabetic Foot Exam - Simple   Simple Foot Form Diabetic Foot exam was performed with the following findings:  Yes   Visual Inspection No deformities, no ulcerations, no other skin breakdown bilaterally:  Yes See comments:  Yes Sensation Testing Intact to touch and monofilament testing bilaterally:  Yes Pulse Check Posterior Tibialis and Dorsalis pulse intact bilaterally:  Yes See comments:  Yes Comments Has flat feet.  No discoloration or cyanosis of toes Posterior tibialis pulses not palpable, normal dorsalis pedis        ASSESSMENT:  Diabetes type 2  Morbid obesity     See history of present illness for detailed discussion of current diabetes management, blood sugar patterns and problems identified  His A1c is 7.2 but his blood sugar this morning is over 200 This is despite his losing weight and reportedly following a low-calorie and low carbohydrate diet Currently not on medications, previously did not tolerate  metformin However he may be a good candidate for an SGLT2 drug because of his concomitant obesity and mild hypertension  HYPERTENSION: Blood pressure is persistently high  Most likely he has mild hypertension and he is refusing to consider medications, does have family history of hypertension  Erectile dysfunction: Likely from diabetes  No evidence of neuropathy on exam  Lipids: Needs follow-up evaluation, baseline LDL was borderline  PLAN:    Check baseline renal function  If kidney functions are normal he will start Jardiance 10 mg daily Discussed action of SGLT 2 drugs on lowering glucose by decreasing kidney absorption of glucose, benefits of weight loss and lower blood pressure, possible side effects including genital candidiasis and dosage regimen   Start checking blood sugars at least twice a week with some readings after meals  Start exercise when able to  May consider increasing Jardiance if sugars are still not adequately controlled and will reassess his level of control with fructosamine on the next visit  He needs to bring his monitor for download on each visit  Continue to monitor blood pressure  Patient Instructions  Check blood sugars on waking up  2/7  Also check blood sugars about 2 hours after a meal and do this after different meals by rotation  Recommended blood sugar levels on waking up is 90-130 and about 2 hours after meal is 130-160  Please bring your blood sugar monitor to each visit, thank you       Elayne Snare 07/29/2018, 11:26 AM   Note: This office note was prepared with Dragon voice recognition system technology. Any transcriptional errors that result from this process are unintentional.

## 2018-07-29 ENCOUNTER — Encounter: Payer: Self-pay | Admitting: Endocrinology

## 2018-07-29 ENCOUNTER — Ambulatory Visit (INDEPENDENT_AMBULATORY_CARE_PROVIDER_SITE_OTHER): Payer: 59 | Admitting: Endocrinology

## 2018-07-29 VITALS — BP 138/92 | HR 62 | Ht 76.0 in | Wt 356.0 lb

## 2018-07-29 DIAGNOSIS — E1165 Type 2 diabetes mellitus with hyperglycemia: Secondary | ICD-10-CM

## 2018-07-29 LAB — LIPID PANEL
Cholesterol: 150 mg/dL (ref 0–200)
HDL: 39.3 mg/dL (ref >39.00)
LDL Cholesterol: 101 mg/dL — ABNORMAL HIGH (ref 0–99)
NonHDL: 110.27
Total CHOL/HDL Ratio: 4
Triglycerides: 48 mg/dL (ref 0.0–149.0)
VLDL: 9.6 mg/dL (ref 0.0–40.0)

## 2018-07-29 LAB — COMPREHENSIVE METABOLIC PANEL
ALT: 23 U/L (ref 0–53)
AST: 19 U/L (ref 0–37)
Albumin: 4 g/dL (ref 3.5–5.2)
Alkaline Phosphatase: 70 U/L (ref 39–117)
BUN: 12 mg/dL (ref 6–23)
CO2: 25 mEq/L (ref 19–32)
Calcium: 9.7 mg/dL (ref 8.4–10.5)
Chloride: 105 mEq/L (ref 96–112)
Creatinine, Ser: 1.01 mg/dL (ref 0.40–1.50)
GFR: 110 mL/min (ref >60.00)
Glucose, Bld: 195 mg/dL — ABNORMAL HIGH (ref 70–99)
Potassium: 3.8 mEq/L (ref 3.5–5.1)
Sodium: 138 mEq/L (ref 135–145)
Total Bilirubin: 0.5 mg/dL (ref 0.2–1.2)
Total Protein: 7.3 g/dL (ref 6.0–8.3)

## 2018-07-29 LAB — GLUCOSE, POCT (MANUAL RESULT ENTRY): POC Glucose: 221 mg/dl — AB (ref 70–99)

## 2018-07-29 LAB — POCT GLYCOSYLATED HEMOGLOBIN (HGB A1C): Hemoglobin A1C: 7.2 % — AB (ref 4.0–5.6)

## 2018-07-29 LAB — MICROALBUMIN / CREATININE URINE RATIO
Creatinine,U: 696.9 mg/dL
Microalb Creat Ratio: 2.5 mg/g (ref 0.0–30.0)
Microalb, Ur: 17.3 mg/dL — ABNORMAL HIGH (ref 0.0–1.9)

## 2018-07-29 MED ORDER — EMPAGLIFLOZIN 10 MG PO TABS: 10.0000 mg | ORAL_TABLET | Freq: Every day | ORAL | 1 refills | Status: DC

## 2018-07-29 NOTE — Patient Instructions (Addendum)
Check blood sugars on waking up  2/7  Also check blood sugars about 2 hours after a meal and do this after different meals by rotation  Recommended blood sugar levels on waking up is 90-130 and about 2 hours after meal is 130-160  Please bring your blood sugar monitor to each visit, thank you  

## 2018-08-08 ENCOUNTER — Other Ambulatory Visit: Payer: Self-pay | Admitting: Endocrinology

## 2018-08-09 NOTE — Telephone Encounter (Signed)
Pharmacy requests switching to [place]

## 2018-08-09 NOTE — Telephone Encounter (Signed)
Is this because Vania Rea is not covered?  May start 5 mg Farxiga daily

## 2018-08-09 NOTE — Telephone Encounter (Signed)
Please send me details of the specific drugs mentioned

## 2018-08-09 NOTE — Telephone Encounter (Signed)
Sent, and yes Vania Rea is not covered

## 2018-08-09 NOTE — Telephone Encounter (Signed)
Advise on switch requested below from pharmacy

## 2018-08-27 ENCOUNTER — Encounter: Payer: 59 | Admitting: Internal Medicine

## 2018-09-04 ENCOUNTER — Encounter: Payer: Self-pay | Admitting: Nurse Practitioner

## 2018-09-04 ENCOUNTER — Ambulatory Visit (INDEPENDENT_AMBULATORY_CARE_PROVIDER_SITE_OTHER): Payer: 59 | Admitting: Nurse Practitioner

## 2018-09-04 DIAGNOSIS — E669 Obesity, unspecified: Secondary | ICD-10-CM

## 2018-09-04 DIAGNOSIS — Z0000 Encounter for general adult medical examination without abnormal findings: Secondary | ICD-10-CM | POA: Diagnosis not present

## 2018-09-04 DIAGNOSIS — E785 Hyperlipidemia, unspecified: Secondary | ICD-10-CM

## 2018-09-04 DIAGNOSIS — E1169 Type 2 diabetes mellitus with other specified complication: Secondary | ICD-10-CM

## 2018-09-04 DIAGNOSIS — N529 Male erectile dysfunction, unspecified: Secondary | ICD-10-CM | POA: Diagnosis not present

## 2018-09-04 DIAGNOSIS — M25562 Pain in left knee: Secondary | ICD-10-CM

## 2018-09-04 MED ORDER — ATORVASTATIN CALCIUM 20 MG PO TABS
20.0000 mg | ORAL_TABLET | Freq: Every day | ORAL | 3 refills | Status: DC
Start: 2018-09-04 — End: 2019-01-21

## 2018-09-04 MED ORDER — VIAGRA 100 MG PO TABS: 100.0000 mg | ORAL_TABLET | Freq: Every day | ORAL | 0 refills | Status: DC | PRN

## 2018-09-04 NOTE — Progress Notes (Signed)
Careteam: Patient Care Team: Jonathan Cranker, DO as PCP - General (Internal Medicine) Jonathan Montane, MD as Consulting Physician (Otolaryngology)  Advanced Directive information Does Patient Have a Medical Advance Directive?: No  Allergies  Allergen Reactions  . Mushroom Extract Complex Anaphylaxis and Swelling    Lips  . Shrimp [Shellfish Allergy] Anaphylaxis  . Tomato Anaphylaxis    Chief Complaint  Patient presents with  . Medical Management of Chronic Issues    Pt is being seen for a physical. Pt has no concerns today.   . Medication Refill    Pt request refill of hydrocodone due to left knee pain. [place] database verified  . EKG    Pt declined having EKG done     HPI: Patient is a 32 y.o. male seen in the office today for annual exam.   Declines flu shot because "last time got sick as a dog"   Attempts to walk dog Had knee surgery on September 9th - [facility] by Dr Renda Rolls- following up with him, out of work for another 6 weeks.    Pt with DM type 2- seeing Dr Dwyane Dee. Recently placed on farxiga but has not taken due to possible side effects 'yeast, ect'  Hyperlipidemia- taking lipitor 10 mg by mouth since July   Has taken sidenafil for ED, generic did not work as well as branded medication.   Been on strict diet for diabetes and obesity.  exercise limited due to knee surgery.   Review of Systems:  Review of Systems  Constitutional: Negative for chills, fever and weight loss.  HENT: Negative for tinnitus.   Respiratory: Negative for cough, sputum production and shortness of breath.   Cardiovascular: Negative for chest pain, palpitations and leg swelling.  Gastrointestinal: Negative for abdominal pain, constipation, diarrhea and heartburn.  Genitourinary: Negative for dysuria, frequency and urgency.  Musculoskeletal: Positive for joint pain (recent left knee surgery ). Negative for back pain, falls and myalgias.  Skin: Negative.   Neurological:  Negative for dizziness and headaches.  Psychiatric/Behavioral: Negative for depression and memory loss. The patient does not have insomnia.     Past Medical History:  Diagnosis Date  . Blood in stool   . Diabetes mellitus without complication ([place])   . GERD (gastroesophageal reflux disease)   . History of anal fissures   . Low sperm motility    Past Surgical History:  Procedure Laterality Date  . ANAL FISTULECTOMY  2012   with sphhincterotomy  . ANAL FISTULOTOMY N/A 06/29/2015   Procedure: EXCISION PERI RECTAL SCAR/FISTULA, INTERNAL HEMMORRHOIDAL LIGATION, PEXY, MARSUPIALIZATION;  Surgeon: Michael Boston, MD;  Location: [facility];  Service: General;  Laterality: N/A;  . EXAMINATION UNDER ANESTHESIA N/A 06/29/2015   Procedure: EXAM UNDER ANESTHESIA;  Surgeon: Michael Boston, MD;  Location: [facility];  Service: General;  Laterality: N/A;  . RECTAL SURGERY  08/2010   Dr Zenia Resides   Social History:   reports that he has been smoking cigarettes. He has a 1.50 pack-year smoking history. He has never used smokeless tobacco. He reports that he drinks alcohol. He reports that he does not use drugs.  Family History  Problem Relation Age of Onset  . Diabetes Mother   . Hypertension Mother   . Throat cancer Maternal Aunt   . Cirrhosis Maternal Grandfather   . Colon cancer Neg Hx   . Colon polyps Neg Hx   . Kidney disease Neg Hx     Medications: Patient's Medications  New Prescriptions  No medications on file  Previous Medications   ALBUTEROL (PROVENTIL HFA;VENTOLIN HFA) 108 (90 BASE) MCG/ACT INHALER    Inhale 1-2 puffs into the lungs every 6 (six) hours as needed for wheezing or shortness of breath.   ATORVASTATIN (LIPITOR) 10 MG TABLET    Take 1 tablet (10 mg total) by mouth daily.   DAPAGLIFLOZIN PROPANEDIOL (FARXIGA) 5 MG TABS TABLET    Take 5 mg by mouth daily.   GLUCOSE BLOOD (BAYER CONTOUR TEST) TEST STRIP    Use to test blood sugar once daily   HYDROCODONE-ACETAMINOPHEN (NORCO) 5-325 MG  TABLET    Take 1 tablet by mouth every 6 (six) hours as needed for moderate pain.   MULTIPLE VITAMIN (MULTI-VITAMINS) TABS    Take 1 tablet by mouth daily.   SILDENAFIL (VIAGRA) 100 MG TABLET    Take 1 tablet (100 mg total) by mouth daily as needed for erectile dysfunction.  Modified Medications   No medications on file  Discontinued Medications   FLUTICASONE-SALMETEROL (ADVAIR DISKUS) 250-50 MCG/DOSE AEPB    Inhale 1 puff into the lungs 2 (two) times daily.     Physical Exam:  Vitals:   09/04/18 1316  BP: 132/84  Pulse: 72  Temp: 98.7 F (37.1 C)  TempSrc: Oral  SpO2: 98%  Weight: (!) 350 lb (158.8 kg)  Height: 6\' 4"  (1.93 m)   Body mass index is 42.6 kg/m.  Physical Exam  Constitutional: He is oriented to person, place, and time. He appears well-developed and well-nourished.  HENT:  Mouth/Throat: Oropharynx is clear and moist.  MMM; no oral thrush  Eyes: Pupils are equal, round, and reactive to light. No scleral icterus.  Neck: Neck supple. Carotid bruit is not present. No thyromegaly present.  Cardiovascular: Normal rate, regular rhythm, normal heart sounds and intact distal pulses. Exam reveals no gallop and no friction rub.  No murmur heard. No distal LE edema. No calf TTP  Pulmonary/Chest: Effort normal and breath sounds normal. He has no wheezes. He has no rales. He exhibits no tenderness.  Abdominal: Soft. Normal appearance and bowel sounds are normal. He exhibits no distension, no abdominal bruit, no pulsatile midline mass and no mass. There is no hepatomegaly. There is no tenderness. There is no rigidity, no rebound and no guarding. No hernia.  obese  Genitourinary:  Genitourinary Comments: Declines exam  Musculoskeletal: He exhibits tenderness. He exhibits no edema.       Left knee: He exhibits decreased range of motion. He exhibits no swelling.  Lymphadenopathy:    He has no cervical adenopathy.  Neurological: He is alert and oriented to person, place, and  time. He has normal reflexes.  Skin: Skin is warm and dry. No rash noted.  Psychiatric: He has a normal mood and affect. His behavior is normal. Judgment and thought content normal.    Labs reviewed: Basic Metabolic Panel: Recent Labs    07/29/18 1108  NA 138  K 3.8  CL 105  CO2 25  GLUCOSE 195*  BUN 12  CREATININE 1.01  CALCIUM 9.7   Liver Function Tests: Recent Labs    07/29/18 1108  AST 19  ALT 23  ALKPHOS 70  BILITOT 0.5  PROT 7.3  ALBUMIN 4.0   No results for input(s): LIPASE, AMYLASE in the last 8760 hours. No results for input(s): AMMONIA in the last 8760 hours. CBC: No results for input(s): WBC, NEUTROABS, HGB, HCT, MCV, PLT in the last 8760 hours. Lipid Panel: Recent Labs  07/29/18 1108  CHOL 150  HDL 39.30  LDLCALC 101*  TRIG 48.0  CHOLHDL 4   TSH: No results for input(s): TSH in the last 8760 hours. A1C: Lab Results  Component Value Date   HGBA1C 7.2 (A) 07/29/2018     Assessment/Plan 1. Acute pain of left knee S/p knee surgery, we do not have these records at this time, asked to get these sent to office. Continues on hydrocodone/apap per orthopedic  2. Erectile dysfunction, unspecified erectile dysfunction type - VIAGRA 100 MG tablet; Take 1 tablet (100 mg total) by mouth daily as needed for erectile dysfunction.  Dispense: 6 tablet; Refill: 0 -would like brand name only as generic does not work as well  3. Morbid obesity ([place]) -has lost weight, discussed ongoing weight loss with diet and exercise.   4. Wellness examination -doing well at this time. PREVENTIVE COUNSELING:  The patient was counseled regarding the appropriate use of alcohol, regular self-examination of the breasts on a monthly basis, prevention of dental and periodontal disease, diet, regular sustained exercise for at least 30 minutes 5 times per week, testicular self-examination on a monthly basis,smoking cessation, tobacco use,  and recommended schedule for GI hemoccult  testing, colonoscopy, cholesterol, thyroid and diabetes screening. -declined flu shot -up to date on pneumonia vaccine, encouraged smoking cessation.   5. Hyperlipidemia, unspecified hyperlipidemia type -not at goal, to increase Lipitor to 20 mg daily. To continue to work on diet and exercise.  - atorvastatin (LIPITOR) 20 MG tablet; Take 1 tablet (20 mg total) by mouth daily.  Dispense: 30 tablet; Refill: 3  6. DM type 2 -not taking medication due to potential side effects. (rash and yeast infection) went into details on the adverse reactions/potiental side effects of medication but also the effects of untreated diabetes. He is agreeable to take medication at this time. A1c being followed by endocrinology  Next appt: to follow up 3 months with lab work prior to visit.  Carlos American. Strandquist, [facility] [phone]

## 2018-09-04 NOTE — Patient Instructions (Addendum)
Increase lipitor to 20 mg by mouth daily  Follow up in 3 months with lab work before visit  Continue to work on diet and exercise   Preventive Care 18-39 Years, Male Preventive care refers to lifestyle choices and visits with your health care provider that can promote health and wellness. What does preventive care include?  A yearly physical exam. This is also called an annual well check.  Dental exams once or twice a year.  Routine eye exams. Ask your health care provider how often you should have your eyes checked.  Personal lifestyle choices, including: ? Daily care of your teeth and gums. ? Regular physical activity. ? Eating a healthy diet. ? Avoiding tobacco and drug use. ? Limiting alcohol use. ? Practicing safe sex. What happens during an annual well check? The services and screenings done by your health care provider during your annual well check will depend on your age, overall health, lifestyle risk factors, and family history of disease. Counseling Your health care provider may ask you questions about your:  Alcohol use.  Tobacco use.  Drug use.  Emotional well-being.  Home and relationship well-being.  Sexual activity.  Eating habits.  Work and work Statistician.  Screening You may have the following tests or measurements:  Height, weight, and BMI.  Blood pressure.  Lipid and cholesterol levels. These may be checked every 5 years starting at age 22.  Diabetes screening. This is done by checking your blood sugar (glucose) after you have not eaten for a while (fasting).  Skin check.  Hepatitis C blood test.  Hepatitis B blood test.  Sexually transmitted disease (STD) testing.  Discuss your test results, treatment options, and if necessary, the need for more tests with your health care provider. Vaccines Your health care provider may recommend certain vaccines, such as:  Influenza vaccine. This is recommended every year.  Tetanus,  diphtheria, and acellular pertussis (Tdap, Td) vaccine. You may need a Td booster every 10 years.  Varicella vaccine. You may need this if you have not been vaccinated.  HPV vaccine. If you are 32 or younger, you may need three doses over 6 months.  Measles, mumps, and rubella (MMR) vaccine. You may need at least one dose of MMR.You may also need a second dose.  Pneumococcal 13-valent conjugate (PCV13) vaccine. You may need this if you have certain conditions and have not been vaccinated.  Pneumococcal polysaccharide (PPSV23) vaccine. You may need one or two doses if you smoke cigarettes or if you have certain conditions.  Meningococcal vaccine. One dose is recommended if you are age 88-21 years and a first-year college student living in a residence hall, or if you have one of several medical conditions. You may also need additional booster doses.  Hepatitis A vaccine. You may need this if you have certain conditions or if you travel or work in places where you may be exposed to hepatitis A.  Hepatitis B vaccine. You may need this if you have certain conditions or if you travel or work in places where you may be exposed to hepatitis B.  Haemophilus influenzae type b (Hib) vaccine. You may need this if you have certain risk factors.  Talk to your health care provider about which screenings and vaccines you need and how often you need them. This information is not intended to replace advice given to you by your health care provider. Make sure you discuss any questions you have with your health care provider. Document Released: 12/19/2001  Document Revised: 07/12/2016 Document Reviewed: 08/24/2015 Elsevier Interactive Patient Education  [employer].

## 2018-09-05 ENCOUNTER — Telehealth: Payer: Self-pay

## 2018-09-05 NOTE — Telephone Encounter (Signed)
I called patient to see about scheduling his 3 month routine follow up and a lab appointment prior to routine visit.   I left a message for patient to call the office.

## 2018-09-13 NOTE — Telephone Encounter (Signed)
I left a message asking that patient call the office to schedule routine follow up and labs.

## 2018-09-15 ENCOUNTER — Other Ambulatory Visit: Payer: Self-pay | Admitting: Endocrinology

## 2018-09-17 NOTE — Telephone Encounter (Signed)
I left a message asking that patient call the office to schedule a 3 month follow up with a lab appointment prior.

## 2018-09-18 ENCOUNTER — Other Ambulatory Visit (INDEPENDENT_AMBULATORY_CARE_PROVIDER_SITE_OTHER): Payer: 59

## 2018-09-18 DIAGNOSIS — E1165 Type 2 diabetes mellitus with hyperglycemia: Secondary | ICD-10-CM

## 2018-09-18 LAB — BASIC METABOLIC PANEL
BUN: 13 mg/dL (ref 6–23)
CO2: 25 mEq/L (ref 19–32)
Calcium: 9.2 mg/dL (ref 8.4–10.5)
Chloride: 107 mEq/L (ref 96–112)
Creatinine, Ser: 1 mg/dL (ref 0.40–1.50)
GFR: 111.18 mL/min (ref >60.00)
Glucose, Bld: 151 mg/dL — ABNORMAL HIGH (ref 70–99)
Potassium: 3.8 mEq/L (ref 3.5–5.1)
Sodium: 139 mEq/L (ref 135–145)

## 2018-09-19 LAB — FRUCTOSAMINE: Fructosamine: 253 umol/L (ref 0–285)

## 2018-09-23 ENCOUNTER — Ambulatory Visit: Payer: 59 | Admitting: Endocrinology

## 2018-09-30 NOTE — Progress Notes (Deleted)
Patient ID: Jonathan Taylor, male   DOB: [birth year], 32 y.o.   MRN: [id number]            Reason for Appointment: Follow-up for Type 2 Diabetes    History of Present Illness:          Date of diagnosis of type 2 diabetes mellitus: 11/2014        Background history:   Appears to have been diagnosed on routine lab work with baseline A1c of 7.2 He had been prescribed metformin since then but has not been able to tolerate this because of diarrhea and abdominal cramps He has previously been irregular with treatment and follow-up because of periodically financial issues He was also prescribed Trulicity in 02/1637 when his A1c was 6.9 but he did not take this  Recent history:    Non-insulin hypoglycemic drugs the patient is taking are: None, was previously on Metformin 500 bid  Last A1c was 6.9 prior to his last visit in 1/18 and now 7.2  Current management, blood sugar patterns and problems identified:  He has not been seen in follow-up for over a year and a half  Again he did not bring his monitor for download  Appears not to be remembering what his blood sugars are; although he thinks his blood sugars are good at home his highest reading has been over 200 also at home  His metformin was stopped on his initial visit because of side effects  He has however he recently started a diet with smoothies and cutting out high fat foods and appears to be losing weight the last few months  Previously recommended consultation with dietitian but he has not done this  However has not started any exercise program and is just getting over arthroscopy for his knee       Side effects from medications have been: Diarrhea and abdominal cramps with metformin  Compliance with the medical regimen: Fair  Glucose monitoring:  using contour meter  Occasionally, probable numbers by recall: 125-221, mostly fasting      Self-care: The diet that the patient has been following is: tries to limit drinks with  sugar     Typical meal intake: Breakfast is eggs and sausage, dinner variable                Dietician visit, most recent: Never               Exercise: none   Weight history: Previous range 340-377  Wt Readings from Last 3 Encounters:  09/04/18 (!) 350 lb (158.8 kg)  07/29/18 (!) 356 lb (161.5 kg)  02/01/18 (!) 371 lb 12.8 oz (168.6 kg)    Glycemic control:   Lab Results  Component Value Date   HGBA1C 7.2 (A) 07/29/2018   HGBA1C 7.2 (H) 07/10/2017   HGBA1C 7.5 (H) 01/12/2017   Lab Results  Component Value Date   MICROALBUR 17.3 (H) 07/29/2018   LDLCALC 101 (H) 07/29/2018   CREATININE 1.00 09/18/2018   Lab Results  Component Value Date   MICRALBCREAT 2.5 07/29/2018   No visits with results within 1 Week(s) from this visit.  Latest known visit with results is:  Lab on 09/18/2018  Component Date Value Ref Range Status  . Sodium 09/18/2018 139  135 - 145 mEq/L Final  . Potassium 09/18/2018 3.8  3.5 - 5.1 mEq/L Final  . Chloride 09/18/2018 107  96 - 112 mEq/L Final  . CO2 09/18/2018 25  19 - 32 mEq/L Final  .  Glucose, Bld 09/18/2018 151* 70 - 99 mg/dL Final  . BUN 09/18/2018 13  6 - 23 mg/dL Final  . Creatinine, Ser 09/18/2018 1.00  0.40 - 1.50 mg/dL Final  . Calcium 09/18/2018 9.2  8.4 - 10.5 mg/dL Final  . GFR 09/18/2018 111.18  >60.00 mL/min Final  . Fructosamine 09/18/2018 253  0 - 285 umol/L Final   Comment: Published reference interval for apparently healthy subjects between age 41 and 43 is 28 - 285 umol/L and in a poorly controlled diabetic population is 228 - 563 umol/L with a mean of 396 umol/L.     Other active problems: See review of systems    Allergies as of 10/01/2018      Reactions   Mushroom Extract Complex Anaphylaxis, Swelling   Lips   Shrimp [shellfish Allergy] Anaphylaxis   Tomato Anaphylaxis      Medication List        Accurate as of 09/30/18  9:05 PM. Always use your most recent med list.          albuterol 108 (90 Base)  MCG/ACT inhaler Commonly known as:  PROVENTIL HFA;VENTOLIN HFA Inhale 1-2 puffs into the lungs every 6 (six) hours as needed for wheezing or shortness of breath.   atorvastatin 20 MG tablet Commonly known as:  LIPITOR Take 1 tablet (20 mg total) by mouth daily.   FARXIGA 5 MG Tabs tablet Generic drug:  dapagliflozin propanediol TAKE 1 TABLET BY MOUTH EVERY DAY   glucose blood test strip Use to test blood sugar once daily   HYDROcodone-acetaminophen 5-325 MG tablet Commonly known as:  NORCO/VICODIN Take 1 tablet by mouth every 6 (six) hours as needed for moderate pain.   MULTI-VITAMINS Tabs Take 1 tablet by mouth daily.   VIAGRA 100 MG tablet Generic drug:  sildenafil Take 1 tablet (100 mg total) by mouth daily as needed for erectile dysfunction.       Allergies:  Allergies  Allergen Reactions  . Mushroom Extract Complex Anaphylaxis and Swelling    Lips  . Shrimp [Shellfish Allergy] Anaphylaxis  . Tomato Anaphylaxis    Past Medical History:  Diagnosis Date  . Blood in stool   . Diabetes mellitus without complication ([place])   . GERD (gastroesophageal reflux disease)   . History of anal fissures   . Low sperm motility     Past Surgical History:  Procedure Laterality Date  . ANAL FISTULECTOMY  2012   with sphhincterotomy  . ANAL FISTULOTOMY N/A 06/29/2015   Procedure: EXCISION PERI RECTAL SCAR/FISTULA, INTERNAL HEMMORRHOIDAL LIGATION, PEXY, MARSUPIALIZATION;  Surgeon: Michael Boston, MD;  Location: [facility];  Service: General;  Laterality: N/A;  . EXAMINATION UNDER ANESTHESIA N/A 06/29/2015   Procedure: EXAM UNDER ANESTHESIA;  Surgeon: Michael Boston, MD;  Location: [facility];  Service: General;  Laterality: N/A;  . RECTAL SURGERY  08/2010   Dr Zenia Resides    Family History  Problem Relation Age of Onset  . Diabetes Mother   . Hypertension Mother   . Throat cancer Maternal Aunt   . Cirrhosis Maternal Grandfather   . Colon cancer Neg Hx   . Colon polyps Neg Hx   . Kidney  disease Neg Hx     Social History:  reports that he has been smoking cigarettes. He has a 1.50 pack-year smoking history. He has never used smokeless tobacco. He reports that he drinks alcohol. He reports that he does not use drugs.   Review of Systems   Lipid history: No significant  hyperlipidemia previously, needs follow-up    Lab Results  Component Value Date   CHOL 150 07/29/2018   HDL 39.30 07/29/2018   LDLCALC 101 (H) 07/29/2018   TRIG 48.0 07/29/2018   CHOLHDL 4 07/29/2018           Hypertension:Has not been told to have hypertension or treated for this Blood pressure appears to be consistently high, he thinks it may be higher today because of his knee pain from recent surgery He is very reluctant to consider medications as he thinks this would affect his erectile function  BP Readings from Last 3 Encounters:  09/04/18 132/84  07/29/18 (!) 138/92  02/01/18 (!) 134/92    Most recent eye exam, 2/19  Most recent foot exam: 07/2018 No symptoms of numbness or tingling in his feet but he is concerned about darkening of his left second toe distally  Has history of erectile dysfunction treated with Viagra  LABS:  No visits with results within 1 Week(s) from this visit.  Latest known visit with results is:  Lab on 09/18/2018  Component Date Value Ref Range Status  . Sodium 09/18/2018 139  135 - 145 mEq/L Final  . Potassium 09/18/2018 3.8  3.5 - 5.1 mEq/L Final  . Chloride 09/18/2018 107  96 - 112 mEq/L Final  . CO2 09/18/2018 25  19 - 32 mEq/L Final  . Glucose, Bld 09/18/2018 151* 70 - 99 mg/dL Final  . BUN 09/18/2018 13  6 - 23 mg/dL Final  . Creatinine, Ser 09/18/2018 1.00  0.40 - 1.50 mg/dL Final  . Calcium 09/18/2018 9.2  8.4 - 10.5 mg/dL Final  . GFR 09/18/2018 111.18  >60.00 mL/min Final  . Fructosamine 09/18/2018 253  0 - 285 umol/L Final   Comment: Published reference interval for apparently healthy subjects between age 28 and 47 is 8 - 285 umol/L and in  a poorly controlled diabetic population is 228 - 563 umol/L with a mean of 396 umol/L.     Physical Examination:  There were no vitals taken for this visit.    Diabetic Foot Exam - Simple   No data filed         ASSESSMENT:  Diabetes type 2  Morbid obesity     See history of present illness for detailed discussion of current diabetes management, blood sugar patterns and problems identified  His A1c is 7.2 but his blood sugar this morning is over 200 This is despite his losing weight and reportedly following a low-calorie and low carbohydrate diet Currently not on medications, previously did not tolerate metformin However he may be a good candidate for an SGLT2 drug because of his concomitant obesity and mild hypertension  HYPERTENSION: Blood pressure is persistently high  Most likely he has mild hypertension and he is refusing to consider medications, does have family history of hypertension  Erectile dysfunction: Likely from diabetes  No evidence of neuropathy on exam  Lipids: Needs follow-up evaluation, baseline LDL was borderline  PLAN:    Check baseline renal function  If kidney functions are normal he will start Jardiance 10 mg daily Discussed action of SGLT 2 drugs on lowering glucose by decreasing kidney absorption of glucose, benefits of weight loss and lower blood pressure, possible side effects including genital candidiasis and dosage regimen   Start checking blood sugars at least twice a week with some readings after meals  Start exercise when able to  May consider increasing Jardiance if sugars are still not adequately controlled and  will reassess his level of control with fructosamine on the next visit  He needs to bring his monitor for download on each visit  Continue to monitor blood pressure  There are no Patient Instructions on file for this visit.    Elayne Snare 09/30/2018, 9:05 PM   Note: This office note was prepared with Dragon voice  recognition system technology. Any transcriptional errors that result from this process are unintentional.

## 2018-10-01 ENCOUNTER — Other Ambulatory Visit: Payer: Self-pay

## 2018-10-01 ENCOUNTER — Telehealth: Payer: Self-pay | Admitting: Endocrinology

## 2018-10-01 ENCOUNTER — Ambulatory Visit: Payer: 59 | Admitting: Endocrinology

## 2018-10-01 MED ORDER — DAPAGLIFLOZIN PROPANEDIOL 5 MG PO TABS: 5.0000 mg | ORAL_TABLET | Freq: Every day | ORAL | 5 refills | Status: DC

## 2018-10-01 NOTE — Telephone Encounter (Signed)
Patient called in regards to a refill on FARXIGA 5 MG TABS tablet. Please Advise, thanks

## 2018-10-01 NOTE — Telephone Encounter (Signed)
refill has been sent to preferred pharmacy

## 2018-10-08 ENCOUNTER — Telehealth: Payer: Self-pay | Admitting: Endocrinology

## 2018-10-08 NOTE — Telephone Encounter (Signed)
Please advise 

## 2018-10-08 NOTE — Telephone Encounter (Signed)
Jonathan Taylor usually does not cause diarrhea.  He can stop this for 3 days and if diarrhea continues need to talk to his PCP

## 2018-10-08 NOTE — Telephone Encounter (Signed)
Patient called stating that he has had diarrhea since starting Farziga and wants to know if this is normal  Please call at [phone]

## 2018-10-08 NOTE — Telephone Encounter (Signed)
Patient aware of MD advice and will call PCP if diarrhea continues

## 2019-01-21 ENCOUNTER — Ambulatory Visit (INDEPENDENT_AMBULATORY_CARE_PROVIDER_SITE_OTHER): Payer: 59 | Admitting: Nurse Practitioner

## 2019-01-21 ENCOUNTER — Other Ambulatory Visit: Payer: Self-pay

## 2019-01-21 ENCOUNTER — Encounter: Payer: Self-pay | Admitting: Nurse Practitioner

## 2019-01-21 VITALS — BP 138/90 | HR 79 | Temp 98.2°F | Ht 76.0 in | Wt 359.0 lb

## 2019-01-21 DIAGNOSIS — E785 Hyperlipidemia, unspecified: Secondary | ICD-10-CM | POA: Diagnosis not present

## 2019-01-21 DIAGNOSIS — N529 Male erectile dysfunction, unspecified: Secondary | ICD-10-CM

## 2019-01-21 DIAGNOSIS — Z7251 High risk heterosexual behavior: Secondary | ICD-10-CM | POA: Diagnosis not present

## 2019-01-21 DIAGNOSIS — Z6841 Body Mass Index (BMI) 40.0 and over, adult: Secondary | ICD-10-CM

## 2019-01-21 DIAGNOSIS — E669 Obesity, unspecified: Secondary | ICD-10-CM

## 2019-01-21 DIAGNOSIS — E1169 Type 2 diabetes mellitus with other specified complication: Secondary | ICD-10-CM

## 2019-01-21 DIAGNOSIS — I10 Essential (primary) hypertension: Secondary | ICD-10-CM

## 2019-01-21 DIAGNOSIS — H6690 Otitis media, unspecified, unspecified ear: Secondary | ICD-10-CM

## 2019-01-21 DIAGNOSIS — F17200 Nicotine dependence, unspecified, uncomplicated: Secondary | ICD-10-CM

## 2019-01-21 MED ORDER — ATORVASTATIN CALCIUM 20 MG PO TABS: 20.0000 mg | ORAL_TABLET | Freq: Every day | ORAL | 1 refills | Status: DC

## 2019-01-21 NOTE — Progress Notes (Signed)
Careteam: Patient Care Team: [name], NP as PCP - General (Geriatric Medicine) Melissa Montane, MD as Consulting Physician (Otolaryngology)  Advanced Directive information    Allergies  Allergen Reactions  . Mushroom Extract Complex Anaphylaxis and Swelling    Lips  . Shrimp [Shellfish Allergy] Anaphylaxis  . Tomato Anaphylaxis    Chief Complaint  Patient presents with  . Medical Management of Chronic Issues    5 month follow-up. Patient had left knee surgery in September 2019. Patient told to f/u with PCP to get b/p and A1c under control for possible future surgery.   . Medication Management    Stopped Lipitor due to weight loss and felt like he did not need   . Medication Refill    Farxiga and B/P medication if needed   . Best Practice Recommendations    Patient seeing eye doctor in [facility], I will call to get him an appointment      HPI: Patient is a 33 y.o. male seen in the office today for routine follow up.   Would like screening for STD, has a girlfriend who he has caught in a few lies and concerned that he may be at risk for STD. No discharge or sores.   Needs screening for A1c, lipids.   Hx of Complex tear of lateral meniscus, he has already had Left knee arthroscopic lateral meniscectomy and chondroplasty in the setting of grade 4 bipolar cartilage loss 07/15/18 by Dr. Griffin Basil. Continued to have issues therefore he was referred to Dr Laurance Flatten, orthopedic who then referred him to Dr Robby Sermon who he has seen most recently. Plan is for partial knee replacement however needs weight loss and better management of BP and A1C. Having a lot of instability with left knee. Pain 4-5 currently by the time it is evening pain is much worse. Not taking any medication routinely for pain.   Taking farxiga for diabetes- no side effects, does not take blood sugars at home.   Hyperlipidemia- stopped Lipitor because he felt like he did not need it.   Chronic otitis media-  following with ENT, needs to have tubes replaced.   Review of Systems:  Review of Systems  Constitutional: Negative for chills, fever and weight loss.  HENT: Negative for tinnitus.        Left ear fullness  Respiratory: Negative for cough, sputum production and shortness of breath.   Cardiovascular: Negative for chest pain, palpitations and leg swelling.  Gastrointestinal: Negative for abdominal pain, constipation, diarrhea and heartburn.  Genitourinary: Negative for dysuria, frequency and urgency.  Musculoskeletal: Positive for joint pain (left knee). Negative for back pain, falls and myalgias.  Skin: Negative.   Neurological: Negative for dizziness and headaches.  Psychiatric/Behavioral: Negative for depression and memory loss. The patient does not have insomnia.     Past Medical History:  Diagnosis Date  . Blood in stool   . Diabetes mellitus without complication ([place])   . GERD (gastroesophageal reflux disease)   . History of anal fissures   . Low sperm motility    Past Surgical History:  Procedure Laterality Date  . ANAL FISTULECTOMY  2012   with sphhincterotomy  . ANAL FISTULOTOMY N/A 06/29/2015   Procedure: EXCISION PERI RECTAL SCAR/FISTULA, INTERNAL HEMMORRHOIDAL LIGATION, PEXY, MARSUPIALIZATION;  Surgeon: Michael Boston, MD;  Location: [facility];  Service: General;  Laterality: N/A;  . EXAMINATION UNDER ANESTHESIA N/A 06/29/2015   Procedure: EXAM UNDER ANESTHESIA;  Surgeon: Michael Boston, MD;  Location: [facility];  Service: General;  Laterality: N/A;  . KNEE SURGERY  07/07/2018  . RECTAL SURGERY  08/2010   Dr Zenia Resides   Social History:   reports that he has been smoking cigarettes. He has a 1.50 pack-year smoking history. He has never used smokeless tobacco. He reports current alcohol use. He reports that he does not use drugs.  Family History  Problem Relation Age of Onset  . Diabetes Mother   . Hypertension Mother   . Throat cancer Maternal Aunt   . Cirrhosis Maternal  Grandfather   . Colon cancer Neg Hx   . Colon polyps Neg Hx   . Kidney disease Neg Hx     Medications: Patient's Medications  New Prescriptions   No medications on file  Previous Medications   ALBUTEROL (PROVENTIL HFA;VENTOLIN HFA) 108 (90 BASE) MCG/ACT INHALER    Inhale 1-2 puffs into the lungs every 6 (six) hours as needed for wheezing or shortness of breath.   ATORVASTATIN (LIPITOR) 20 MG TABLET    Take 1 tablet (20 mg total) by mouth daily.   DAPAGLIFLOZIN PROPANEDIOL (FARXIGA) 5 MG TABS TABLET    Take 5 mg by mouth daily.   GLUCOSE BLOOD (BAYER CONTOUR TEST) TEST STRIP    Use to test blood sugar once daily   HYDROCODONE-ACETAMINOPHEN (NORCO) 5-325 MG TABLET    Take 1 tablet by mouth every 6 (six) hours as needed for moderate pain.   MULTIPLE VITAMIN (MULTI-VITAMINS) TABS    Take 1 tablet by mouth daily.   VIAGRA 100 MG TABLET    Take 1 tablet (100 mg total) by mouth daily as needed for erectile dysfunction.  Modified Medications   No medications on file  Discontinued Medications   No medications on file     Physical Exam:  Vitals:   01/21/19 0934 01/21/19 1022  BP: (!) 142/96 138/90  Pulse: 79   Temp: 98.2 F (36.8 C)   TempSrc: Oral   SpO2: 98%   Weight: (!) 359 lb (162.8 kg)   Height: 6\' 4"  (1.93 m)    Body mass index is 43.7 kg/m.  Physical Exam Constitutional:      Appearance: Normal appearance. He is well-developed.  HENT:     Left Ear: Ear canal and external ear normal. A middle ear effusion is present.  Eyes:     General: No scleral icterus.    Pupils: Pupils are equal, round, and reactive to light.  Neck:     Musculoskeletal: Neck supple.     Thyroid: No thyromegaly.     Vascular: No carotid bruit.  Cardiovascular:     Rate and Rhythm: Normal rate and regular rhythm.     Heart sounds: Normal heart sounds. No murmur. No friction rub. No gallop.      Comments: No distal LE edema. No calf TTP Pulmonary:     Effort: Pulmonary effort is normal.      Breath sounds: Normal breath sounds. No wheezing or rales.  Chest:     Chest wall: No tenderness.  Abdominal:     General: Bowel sounds are normal. There is no distension or abdominal bruit.     Palpations: Abdomen is soft. Abdomen is not rigid. There is no hepatomegaly, mass or pulsatile mass.     Tenderness: There is no abdominal tenderness. There is no guarding or rebound.     Hernia: No hernia is present.     Comments: obese  Musculoskeletal:        General: Tenderness present.  Left knee: He exhibits decreased range of motion. He exhibits no swelling.  Lymphadenopathy:     Cervical: No cervical adenopathy.  Skin:    General: Skin is warm and dry.     Findings: No rash.  Neurological:     Mental Status: He is alert and oriented to person, place, and time.     Deep Tendon Reflexes: Reflexes are normal and symmetric.  Psychiatric:        Behavior: Behavior normal.        Thought Content: Thought content normal.        Judgment: Judgment normal.     Labs reviewed: Basic Metabolic Panel: Recent Labs    07/29/18 1108 09/18/18 0841  NA 138 139  K 3.8 3.8  CL 105 107  CO2 25 25  GLUCOSE 195* 151*  BUN 12 13  CREATININE 1.01 1.00  CALCIUM 9.7 9.2   Liver Function Tests: Recent Labs    07/29/18 1108  AST 19  ALT 23  ALKPHOS 70  BILITOT 0.5  PROT 7.3  ALBUMIN 4.0   No results for input(s): LIPASE, AMYLASE in the last 8760 hours. No results for input(s): AMMONIA in the last 8760 hours. CBC: No results for input(s): WBC, NEUTROABS, HGB, HCT, MCV, PLT in the last 8760 hours. Lipid Panel: Recent Labs    07/29/18 1108  CHOL 150  HDL 39.30  LDLCALC 101*  TRIG 48.0  CHOLHDL 4   TSH: No results for input(s): TSH in the last 8760 hours. A1C: Lab Results  Component Value Date   HGBA1C 7.2 (A) 07/29/2018     Assessment/Plan 1. High risk heterosexual behavior - HIV Antibody (routine testing w rflx) - RPR - C. trachomatis/N. gonorrhoeae RNA  2.  Erectile dysfunction, unspecified erectile dysfunction type Stable, uses viagra PRN  3. Morbid obesity ([place]) Encouraged weight loss with diet and activity modifications, activity limited due to ongoing pain to left knee. - Amb Ref to Medical Weight Management  4. Hyperlipidemia, unspecified hyperlipidemia type -dietary modifications encouraged with weight loss - Lipid panel - COMPLETE METABOLIC PANEL WITH GFR - atorvastatin (LIPITOR) 20 MG tablet; Take 1 tablet (20 mg total) by mouth daily.  Dispense: 30 tablet; Refill: 1  5. Diabetes mellitus type 2 in obese [facility]) -will follow up A1c today, again dietary modifications encouraged - Hemoglobin A1c - Amb Ref to Medical Weight Management  6. Essential hypertension -improved on recheck but remains elevated, does not wish to start medication due to potential side effects that will worsen ED. Advised on risk of CVA and MI due to uncontrolled htn, dm and hyperlipidemia.  - CBC with Differential/Platelets - Amb Ref to Medical Weight Management  7. BMI 40.0-44.9, adult ([place]) Noted, weight loss advised.   8. Chronic otitis media, unspecified otitis media type -ongoing, following with ENT   9. Smoker -smoking about 2 cigarettes daily, encouraged smoking cessation  Next appt: 1 month.  Carlos American. [place], [facility] [phone]

## 2019-01-21 NOTE — Patient Instructions (Signed)
Labs ordered today Encourage weight loss and diet modifications   Follow up in 1 month for recheck on blood pressure To take blood pressure at home while resting/not stressed or in bed Record and bring to next Office visit Blood pressure should be LESS THAN 140/90  DASH Eating Plan DASH stands for "Dietary Approaches to Stop Hypertension." The DASH eating plan is a healthy eating plan that has been shown to reduce high blood pressure (hypertension). It may also reduce your risk for type 2 diabetes, heart disease, and stroke. The DASH eating plan may also help with weight loss. What are tips for following this plan?  General guidelines  Avoid eating more than 2,300 mg (milligrams) of salt (sodium) a day. If you have hypertension, you may need to reduce your sodium intake to 1,500 mg a day.  Limit alcohol intake to no more than 1 drink a day for nonpregnant women and 2 drinks a day for men. One drink equals 12 oz of beer, 5 oz of wine, or 1 oz of hard liquor.  Work with your health care provider to maintain a healthy body weight or to lose weight. Ask what an ideal weight is for you.  Get at least 30 minutes of exercise that causes your heart to beat faster (aerobic exercise) most days of the week. Activities may include walking, swimming, or biking.  Work with your health care provider or diet and nutrition specialist (dietitian) to adjust your eating plan to your individual calorie needs. Reading food labels   Check food labels for the amount of sodium per serving. Choose foods with less than 5 percent of the Daily Value of sodium. Generally, foods with less than 300 mg of sodium per serving fit into this eating plan.  To find whole grains, look for the word "whole" as the first word in the ingredient list. Shopping  Buy products labeled as "low-sodium" or "no salt added."  Buy fresh foods. Avoid canned foods and premade or frozen meals. Cooking  Avoid adding salt when cooking.  Use salt-free seasonings or herbs instead of table salt or sea salt. Check with your health care provider or pharmacist before using salt substitutes.  Do not fry foods. Cook foods using healthy methods such as baking, boiling, grilling, and broiling instead.  Cook with heart-healthy oils, such as olive, canola, soybean, or sunflower oil. Meal planning  Eat a balanced diet that includes: ? 5 or more servings of fruits and vegetables each day. At each meal, try to fill half of your plate with fruits and vegetables. ? Up to 6-8 servings of whole grains each day. ? Less than 6 oz of lean meat, poultry, or fish each day. A 3-oz serving of meat is about the same size as a deck of cards. One egg equals 1 oz. ? 2 servings of low-fat dairy each day. ? A serving of nuts, seeds, or beans 5 times each week. ? Heart-healthy fats. Healthy fats called Omega-3 fatty acids are found in foods such as flaxseeds and coldwater fish, like sardines, salmon, and mackerel.  Limit how much you eat of the following: ? Canned or prepackaged foods. ? Food that is high in trans fat, such as fried foods. ? Food that is high in saturated fat, such as fatty meat. ? Sweets, desserts, sugary drinks, and other foods with added sugar. ? Full-fat dairy products.  Do not salt foods before eating.  Try to eat at least 2 vegetarian meals each week.  Eat  more home-cooked food and less restaurant, buffet, and fast food.  When eating at a restaurant, ask that your food be prepared with less salt or no salt, if possible. What foods are recommended? The items listed may not be a complete list. Talk with your dietitian about what dietary choices are best for you. Grains Whole-grain or whole-wheat bread. Whole-grain or whole-wheat pasta. Brown rice. [place]. Bulgur. Whole-grain and low-sodium cereals. Pita bread. Low-fat, low-sodium crackers. Whole-wheat flour tortillas. Vegetables Fresh or frozen vegetables (raw,  steamed, roasted, or grilled). Low-sodium or reduced-sodium tomato and vegetable juice. Low-sodium or reduced-sodium tomato sauce and tomato paste. Low-sodium or reduced-sodium canned vegetables. Fruits All fresh, dried, or frozen fruit. Canned fruit in natural juice (without added sugar). Meat and other protein foods Skinless chicken or [place]. Ground chicken or [place]. Pork with fat trimmed off. Fish and seafood. Egg whites. Dried beans, peas, or lentils. Unsalted nuts, nut butters, and seeds. Unsalted canned beans. Lean cuts of beef with fat trimmed off. Low-sodium, lean deli meat. Dairy Low-fat (1%) or fat-free (skim) milk. Fat-free, low-fat, or reduced-fat cheeses. Nonfat, low-sodium ricotta or cottage cheese. Low-fat or nonfat yogurt. Low-fat, low-sodium cheese. Fats and oils Soft margarine without trans fats. Vegetable oil. Low-fat, reduced-fat, or light mayonnaise and salad dressings (reduced-sodium). Canola, safflower, olive, soybean, and sunflower oils. Avocado. Seasoning and other foods Herbs. Spices. Seasoning mixes without salt. Unsalted popcorn and pretzels. Fat-free sweets. What foods are not recommended? The items listed may not be a complete list. Talk with your dietitian about what dietary choices are best for you. Grains Baked goods made with fat, such as croissants, muffins, or some breads. Dry pasta or rice meal packs. Vegetables Creamed or fried vegetables. Vegetables in a cheese sauce. Regular canned vegetables (not low-sodium or reduced-sodium). Regular canned tomato sauce and paste (not low-sodium or reduced-sodium). Regular tomato and vegetable juice (not low-sodium or reduced-sodium). Angie Fava. Olives. Fruits Canned fruit in a light or heavy syrup. Fried fruit. Fruit in cream or butter sauce. Meat and other protein foods Fatty cuts of meat. Ribs. Fried meat. Berniece Salines. Sausage. Bologna and other processed lunch meats. Salami. Fatback. Hotdogs. Bratwurst. Salted nuts and  seeds. Canned beans with added salt. Canned or smoked fish. Whole eggs or egg yolks. Chicken or [place] with skin. Dairy Whole or 2% milk, cream, and half-and-half. Whole or full-fat cream cheese. Whole-fat or sweetened yogurt. Full-fat cheese. Nondairy creamers. Whipped toppings. Processed cheese and cheese spreads. Fats and oils Butter. Stick margarine. Lard. Shortening. Ghee. Bacon fat. Tropical oils, such as coconut, palm kernel, or palm oil. Seasoning and other foods Salted popcorn and pretzels. Onion salt, garlic salt, seasoned salt, table salt, and sea salt. Worcestershire sauce. Tartar sauce. Barbecue sauce. Teriyaki sauce. Soy sauce, including reduced-sodium. Steak sauce. Canned and packaged gravies. Fish sauce. Oyster sauce. Cocktail sauce. Horseradish that you find on the shelf. Ketchup. Mustard. Meat flavorings and tenderizers. Bouillon cubes. Hot sauce and Tabasco sauce. Premade or packaged marinades. Premade or packaged taco seasonings. Relishes. Regular salad dressings. Where to find more information:  National Heart, Lung, and [place]: https://wilson-eaton.com/  American Heart Association: www.heart.org Summary  The DASH eating plan is a healthy eating plan that has been shown to reduce high blood pressure (hypertension). It may also reduce your risk for type 2 diabetes, heart disease, and stroke.  With the DASH eating plan, you should limit salt (sodium) intake to 2,300 mg a day. If you have hypertension, you may need to reduce your sodium intake to  1,500 mg a day.  When on the DASH eating plan, aim to eat more fresh fruits and vegetables, whole grains, lean proteins, low-fat dairy, and heart-healthy fats.  Work with your health care provider or diet and nutrition specialist (dietitian) to adjust your eating plan to your individual calorie needs. This information is not intended to replace advice given to you by your health care provider. Make sure you discuss any questions you  have with your health care provider. Document Released: 10/12/2011 Document Revised: 10/16/2016 Document Reviewed: 10/16/2016 Elsevier Interactive Patient Education  2019 [employer].

## 2019-01-22 LAB — COMPLETE METABOLIC PANEL WITH GFR
AG Ratio: 1.7 (calc) (ref 1.0–2.5)
ALT: 31 U/L (ref 9–46)
AST: 29 U/L (ref 10–40)
Albumin: 4.1 g/dL (ref 3.6–5.1)
Alkaline phosphatase (APISO): 75 U/L (ref 36–130)
BUN: 10 mg/dL (ref 7–25)
CO2: 26 mmol/L (ref 20–32)
Calcium: 9.4 mg/dL (ref 8.6–10.3)
Chloride: 105 mmol/L (ref 98–110)
Creat: 1 mg/dL (ref 0.60–1.35)
GFR, Est African American: 115 mL/min/{1.73_m2} (ref >=60)
GFR, Est Non African American: 99 mL/min/{1.73_m2} (ref >=60)
Globulin: 2.4 g/dL (calc) (ref 1.9–3.7)
Glucose, Bld: 139 mg/dL — ABNORMAL HIGH (ref 65–99)
Potassium: 4.1 mmol/L (ref 3.5–5.3)
Sodium: 140 mmol/L (ref 135–146)
Total Bilirubin: 0.4 mg/dL (ref 0.2–1.2)
Total Protein: 6.5 g/dL (ref 6.1–8.1)

## 2019-01-22 LAB — LIPID PANEL
Cholesterol: 146 mg/dL (ref <200)
HDL: 46 mg/dL (ref >=40)
LDL Cholesterol (Calc): 86 mg/dL (calc)
Non-HDL Cholesterol (Calc): 100 mg/dL (calc) (ref <130)
Total CHOL/HDL Ratio: 3.2 (calc) (ref <5.0)
Triglycerides: 48 mg/dL (ref <150)

## 2019-01-22 LAB — CBC WITH DIFFERENTIAL/PLATELET
Absolute Monocytes: 402 cells/uL (ref 200–950)
Basophils Absolute: 18 cells/uL (ref 0–200)
Basophils Relative: 0.3 %
Eosinophils Absolute: 48 cells/uL (ref 15–500)
Eosinophils Relative: 0.8 %
HCT: 41.5 % (ref 38.5–50.0)
Hemoglobin: 13.9 g/dL (ref 13.2–17.1)
Lymphs Abs: 2076 cells/uL (ref 850–3900)
MCH: 26.1 pg — ABNORMAL LOW (ref 27.0–33.0)
MCHC: 33.5 g/dL (ref 32.0–36.0)
MCV: 78 fL — ABNORMAL LOW (ref 80.0–100.0)
MPV: 11.3 fL (ref 7.5–12.5)
Monocytes Relative: 6.7 %
Neutro Abs: 3456 cells/uL (ref 1500–7800)
Neutrophils Relative %: 57.6 %
Platelets: 257 10*3/uL (ref 140–400)
RBC: 5.32 10*6/uL (ref 4.20–5.80)
RDW: 13.1 % (ref 11.0–15.0)
Total Lymphocyte: 34.6 %
WBC: 6 10*3/uL (ref 3.8–10.8)

## 2019-01-22 LAB — RPR: RPR Ser Ql: NONREACTIVE

## 2019-01-22 LAB — HIV ANTIBODY (ROUTINE TESTING W REFLEX): HIV 1&2 Ab, 4th Generation: NONREACTIVE

## 2019-01-22 LAB — HEMOGLOBIN A1C
Hgb A1c MFr Bld: 7.5 % of total Hgb — ABNORMAL HIGH (ref <5.7)
Mean Plasma Glucose: 169 (calc)
eAG (mmol/L): 9.3 (calc)

## 2019-01-22 LAB — C. TRACHOMATIS/N. GONORRHOEAE RNA
C. trachomatis RNA, TMA: NOT DETECTED
N. gonorrhoeae RNA, TMA: NOT DETECTED

## 2019-01-23 ENCOUNTER — Other Ambulatory Visit: Payer: Self-pay

## 2019-01-23 ENCOUNTER — Encounter: Payer: Self-pay | Admitting: Podiatry

## 2019-01-23 ENCOUNTER — Ambulatory Visit (INDEPENDENT_AMBULATORY_CARE_PROVIDER_SITE_OTHER): Payer: 59 | Admitting: Podiatry

## 2019-01-23 VITALS — BP 136/91

## 2019-01-23 DIAGNOSIS — B351 Tinea unguium: Secondary | ICD-10-CM | POA: Diagnosis not present

## 2019-01-23 DIAGNOSIS — L309 Dermatitis, unspecified: Secondary | ICD-10-CM | POA: Diagnosis not present

## 2019-01-23 DIAGNOSIS — I10 Essential (primary) hypertension: Secondary | ICD-10-CM

## 2019-01-23 DIAGNOSIS — E669 Obesity, unspecified: Secondary | ICD-10-CM

## 2019-01-23 DIAGNOSIS — E785 Hyperlipidemia, unspecified: Secondary | ICD-10-CM

## 2019-01-23 DIAGNOSIS — E1169 Type 2 diabetes mellitus with other specified complication: Secondary | ICD-10-CM

## 2019-01-23 DIAGNOSIS — Z6841 Body Mass Index (BMI) 40.0 and over, adult: Secondary | ICD-10-CM

## 2019-01-23 MED ORDER — TERBINAFINE HCL 250 MG PO TABS: 250.0000 mg | ORAL_TABLET | Freq: Every day | ORAL | 0 refills | Status: DC

## 2019-01-23 NOTE — Progress Notes (Signed)
Subjective:   Patient ID: Jonathan Taylor, male   DOB: 33 y.o.   MRN: [id number]   HPI Patient states I had a lot of blisters in the bottom of my left foot and I do have diabetes under reasonable control.  Also I have noted a lesion on my right foot and I am a diabetic x5 years and patient does not smoke likes to be active and is obese   Review of Systems  All other systems reviewed and are negative.       Objective:  Physical Exam Vitals signs and nursing note reviewed.  Constitutional:      Appearance: He is well-developed.  Pulmonary:     Effort: Pulmonary effort is normal.  Musculoskeletal: Normal range of motion.  Skin:    General: Skin is warm.  Neurological:     Mental Status: He is alert.     Neurovascular status was found to be intact moderate equinus was noted with patient found to have discoloration of the left plantar foot with history of blistering and irritation between the digits with mild change on the right around the first metatarsal.  Patient has good digital perfusion and is well oriented x3     Assessment:  Probability for fungal infection which is been present for the last month and also interdigital fungal infection with mild nail fungus     Plan:  H&P all conditions reviewed education rendered and at this point there is no indication of bacterial infection.  I did place him on a oral antifungal for 45 days after reviewing his blood work indicating normal liver function and an A1c is 7.5.  He will be seen back if symptoms persist and was instructed on what to watch for as far as taking medicine goes

## 2019-02-21 ENCOUNTER — Ambulatory Visit: Payer: 59 | Admitting: Nurse Practitioner

## 2019-03-10 ENCOUNTER — Other Ambulatory Visit: Payer: Self-pay

## 2019-03-10 ENCOUNTER — Encounter (HOSPITAL_COMMUNITY): Payer: Self-pay

## 2019-03-10 ENCOUNTER — Ambulatory Visit (HOSPITAL_COMMUNITY)
Admission: EM | Admit: 2019-03-10 | Discharge: 2019-03-10 | Disposition: A | Discharge status: Home / Self Care | Payer: 59 | Attending: Family Medicine | Admitting: Family Medicine

## 2019-03-10 DIAGNOSIS — Z7251 High risk heterosexual behavior: Secondary | ICD-10-CM | POA: Diagnosis not present

## 2019-03-10 DIAGNOSIS — Z113 Encounter for screening for infections with a predominantly sexual mode of transmission: Secondary | ICD-10-CM

## 2019-03-10 NOTE — ED Provider Notes (Signed)
[facility]    CSN: [id number] Arrival date & time: 03/10/19  1313     History   Chief Complaint Chief Complaint  Patient presents with  . SEXUALLY TRANSMITTED DISEASE    HPI Jonathan Taylor is a 33 y.o. male.   HPI  Patient states he had unprotected sex about 2 weeks ago.  He states is been bothering some since then.  He wants STD testing.  He was a woman he does not know well.  He has not been informed that he has any exposure.  He is not having any symptoms.  Past Medical History:  Diagnosis Date  . Blood in stool   . Diabetes mellitus without complication ([place])   . GERD (gastroesophageal reflux disease)   . History of anal fissures   . Low sperm motility     Patient Active Problem List   Diagnosis Date Noted  . OSA (obstructive sleep apnea) 11/20/2017  . Low testosterone in male 07/11/2017  . Snoring 07/11/2017  . Chest pain with low risk for cardiac etiology 04/29/2017  . Erectile dysfunction associated with type 2 diabetes mellitus ([place]) 10/02/2016  . Essential hypertension 10/02/2016  . Panic attacks 12/02/2015  . Sleep disturbance 10/07/2015  . Possible exposure to STD 10/07/2015  . Diabetes mellitus type 2 in obese ([place]) 03/14/2015  . Male fertility problem 03/14/2015  . Obesity, morbid, BMI 40.0-49.9 ([place]) 11/17/2014  . Anal fistula 09/12/2011    Past Surgical History:  Procedure Laterality Date  . ANAL FISTULECTOMY  2012   with sphhincterotomy  . ANAL FISTULOTOMY N/A 06/29/2015   Procedure: EXCISION PERI RECTAL SCAR/FISTULA, INTERNAL HEMMORRHOIDAL LIGATION, PEXY, MARSUPIALIZATION;  Surgeon: Michael Boston, MD;  Location: [facility];  Service: General;  Laterality: N/A;  . EXAMINATION UNDER ANESTHESIA N/A 06/29/2015   Procedure: EXAM UNDER ANESTHESIA;  Surgeon: Michael Boston, MD;  Location: [facility];  Service: General;  Laterality: N/A;  . KNEE SURGERY  07/07/2018  . RECTAL SURGERY  08/2010   Dr Zenia Resides       Home Medications    Prior to  Admission medications   Medication Sig Start Date End Date Taking? Authorizing Provider  albuterol (PROVENTIL HFA;VENTOLIN HFA) 108 (90 Base) MCG/ACT inhaler Inhale 1-2 puffs into the lungs every 6 (six) hours as needed for wheezing or shortness of breath. 01/25/18   Langston Masker B, PA-C  atorvastatin (LIPITOR) 20 MG tablet Take 1 tablet (20 mg total) by mouth daily. 01/21/19 01/21/20  [name], NP  dapagliflozin propanediol (FARXIGA) 5 MG TABS tablet Take 5 mg by mouth daily. 10/01/18   Elayne Snare, MD  glucose blood (BAYER CONTOUR TEST) test strip Use to test blood sugar once daily 11/16/16   Elayne Snare, MD  Multiple Vitamin (MULTI-VITAMINS) TABS Take 1 tablet by mouth daily.    [provider]  terbinafine (LAMISIL) 250 MG tablet Take 1 tablet (250 mg total) by mouth daily. 01/23/19   Wallene Huh, DPM  VIAGRA 100 MG tablet Take 1 tablet (100 mg total) by mouth daily as needed for erectile dysfunction. 09/04/18   [name], NP    Family History Family History  Problem Relation Age of Onset  . Diabetes Mother   . Hypertension Mother   . Throat cancer Maternal Aunt   . Cirrhosis Maternal Grandfather   . Colon cancer Neg Hx   . Colon polyps Neg Hx   . Kidney disease Neg Hx     Social History Social History  Tobacco Use  . Smoking status: Light Tobacco Smoker    Packs/day: 0.15    Years: 10.00    Pack years: 1.50    Types: Cigarettes    Last attempt to quit: 05/26/2015    Years since quitting: 3.7  . Smokeless tobacco: Never Used  . Tobacco comment: smokes when he drinks  Substance Use Topics  . Alcohol use: Yes    Comment: Occassionally  . Drug use: No     Allergies   Mushroom extract complex; Shrimp [shellfish allergy]; and Tomato   Review of Systems Review of Systems  Constitutional: Negative for chills and fever.  HENT: Negative for ear pain and sore throat.   Eyes: Negative for pain and visual disturbance.  Respiratory: Negative for  cough and shortness of breath.   Cardiovascular: Negative for chest pain and palpitations.  Gastrointestinal: Negative for abdominal pain and vomiting.  Genitourinary: Negative for dysuria and hematuria.  Musculoskeletal: Negative for arthralgias and back pain.  Skin: Negative for color change and rash.  Neurological: Negative for seizures and syncope.  All other systems reviewed and are negative.    Physical Exam Triage Vital Signs ED Triage Vitals  Enc Vitals Group     BP 03/10/19 1325 (!) 154/114     Pulse Rate 03/10/19 1325 81     Resp 03/10/19 1325 18     Temp 03/10/19 1325 98.7 F (37.1 C)     Temp Source 03/10/19 1325 Oral     SpO2 03/10/19 1325 98 %     Weight --      Height --      Head Circumference --      Peak Flow --      Pain Score 03/10/19 1324 0     Pain Loc --      Pain Edu? --      Excl. in [place]? --    No data found.  Updated Vital Signs BP (!) 154/114 (BP Location: Right Arm)   Pulse 81   Temp 98.7 F (37.1 C) (Oral)   Resp 18   SpO2 98%       Physical Exam Constitutional:      General: He is not in acute distress.    Appearance: He is well-developed.  HENT:     Head: Normocephalic and atraumatic.  Eyes:     Conjunctiva/sclera: Conjunctivae normal.     Pupils: Pupils are equal, round, and reactive to light.  Neck:     Musculoskeletal: Normal range of motion.  Cardiovascular:     Rate and Rhythm: Normal rate.  Pulmonary:     Effort: Pulmonary effort is normal. No respiratory distress.  Abdominal:     General: There is no distension.     Palpations: Abdomen is soft.  Genitourinary:    Comments: Physical exam deferred Musculoskeletal: Normal range of motion.  Skin:    General: Skin is warm and dry.  Neurological:     Mental Status: He is alert.      UC Treatments / Results  Labs (all labs ordered are listed, but only abnormal results are displayed) Labs Reviewed  RPR  HIV ANTIBODY (ROUTINE TESTING W REFLEX)  URINE CYTOLOGY  ANCILLARY ONLY    EKG None  Radiology No results found.  Procedures Procedures (including critical care time)  Medications Ordered in UC Medications - No data to display  Initial Impression / Assessment and Plan / UC Course  I have reviewed the triage vital signs and the nursing  notes.  Pertinent labs & imaging results that were available during my care of the patient were reviewed by me and considered in my medical decision making (see chart for details).      Final Clinical Impressions(s) / UC Diagnoses   Final diagnoses:  High risk sexual behavior, unspecified type     Discharge Instructions     We did lab testing during this visit.  If there are any abnormal findings that require change in medicine or indicate a positive result, you will be notified.  If all of your tests are normal, you will not be called.      ED Prescriptions    None     Controlled Substance Prescriptions [place] Controlled Substance Registry consulted? Not Applicable   Raylene Everts, MD 03/10/19 2024

## 2019-03-10 NOTE — Discharge Instructions (Addendum)
We did lab testing during this visit.  If there are any abnormal findings that require change in medicine or indicate a positive result, you will be notified.  If all of your tests are normal, you will not be called.

## 2019-03-10 NOTE — ED Triage Notes (Signed)
Patient presents to Urgent Care with complaints of lower abdominal pain and request for STD check since having unprotected intercourse this past weekend. Patient reports he does not think the abdominal pain is related to the STD concerns, is not having other symptoms.

## 2019-03-11 LAB — HIV ANTIBODY (ROUTINE TESTING W REFLEX): HIV Screen 4th Generation wRfx: NONREACTIVE

## 2019-03-11 LAB — URINE CYTOLOGY ANCILLARY ONLY
Chlamydia: NEGATIVE
Neisseria Gonorrhea: NEGATIVE
Trichomonas: NEGATIVE

## 2019-03-11 LAB — RPR: RPR Ser Ql: NONREACTIVE

## 2019-03-24 ENCOUNTER — Ambulatory Visit: Payer: Self-pay | Admitting: Nurse Practitioner

## 2019-04-07 ENCOUNTER — Ambulatory Visit: Payer: 59 | Admitting: Nurse Practitioner

## 2019-04-28 ENCOUNTER — Other Ambulatory Visit: Payer: 59

## 2019-04-28 ENCOUNTER — Other Ambulatory Visit: Payer: Self-pay

## 2019-04-28 ENCOUNTER — Encounter (HOSPITAL_COMMUNITY): Payer: Self-pay | Admitting: Emergency Medicine

## 2019-04-28 ENCOUNTER — Ambulatory Visit (HOSPITAL_COMMUNITY)
Admission: EM | Admit: 2019-04-28 | Discharge: 2019-04-28 | Disposition: A | Discharge status: Home / Self Care | Payer: 59 | Attending: Family Medicine | Admitting: Family Medicine

## 2019-04-28 DIAGNOSIS — Z202 Contact with and (suspected) exposure to infections with a predominantly sexual mode of transmission: Secondary | ICD-10-CM | POA: Insufficient documentation

## 2019-04-28 NOTE — Discharge Instructions (Addendum)
We are sending your urine for testing.  We will call with any positive results.  You can also check your MyChart for results

## 2019-04-28 NOTE — ED Triage Notes (Signed)
Denies symptoms.  But concerned his partner may have been seeing someone else

## 2019-04-28 NOTE — ED Provider Notes (Signed)
[facility]    CSN: [id number] Arrival date & time: 04/28/19  1145     History   Chief Complaint Chief Complaint  Patient presents with  . SEXUALLY TRANSMITTED DISEASE    HPI Jonathan Taylor is a 33 y.o. male.   Patient is a 33 year old male with past medical history of diabetes, GERD.  He presents today for STD screening.  Denies any known exposures or any symptoms.  He would just like routine testing.     Past Medical History:  Diagnosis Date  . Blood in stool   . Diabetes mellitus without complication ([place])   . GERD (gastroesophageal reflux disease)   . History of anal fissures   . Low sperm motility     Patient Active Problem List   Diagnosis Date Noted  . OSA (obstructive sleep apnea) 11/20/2017  . Low testosterone in male 07/11/2017  . Snoring 07/11/2017  . Chest pain with low risk for cardiac etiology 04/29/2017  . Erectile dysfunction associated with type 2 diabetes mellitus ([place]) 10/02/2016  . Essential hypertension 10/02/2016  . Panic attacks 12/02/2015  . Sleep disturbance 10/07/2015  . Possible exposure to STD 10/07/2015  . Diabetes mellitus type 2 in obese ([place]) 03/14/2015  . Male fertility problem 03/14/2015  . Obesity, morbid, BMI 40.0-49.9 ([place]) 11/17/2014  . Anal fistula 09/12/2011    Past Surgical History:  Procedure Laterality Date  . ANAL FISTULECTOMY  2012   with sphhincterotomy  . ANAL FISTULOTOMY N/A 06/29/2015   Procedure: EXCISION PERI RECTAL SCAR/FISTULA, INTERNAL HEMMORRHOIDAL LIGATION, PEXY, MARSUPIALIZATION;  Surgeon: Michael Boston, MD;  [facility];  Service: General;  Laterality: N/A;  . EXAMINATION UNDER ANESTHESIA N/A 06/29/2015   Procedure: EXAM UNDER ANESTHESIA;  Surgeon: Michael Boston, MD;  Location[facility];  Service: General;  Laterality: N/A;  . KNEE SURGERY  07/07/2018  . RECTAL SURGERY  08/2010   Dr Zenia Resides       Home Medications    Prior to Admission medications   Medication Sig Start Date End Date  Taking? Authorizing Provider  albuterol (PROVENTIL HFA;VENTOLIN HFA) 108 (90 Base) MCG/ACT inhaler Inhale 1-2 puffs into the lungs every 6 (six) hours as needed for wheezing or shortness of breath. 01/25/18 04/28/19  Langston Masker B, PA-C  atorvastatin (LIPITOR) 20 MG tablet Take 1 tablet (20 mg total) by mouth daily. 01/21/19 04/28/19  [name], NP  VIAGRA 100 MG tablet Take 1 tablet (100 mg total) by mouth daily as needed for erectile dysfunction. 09/04/18 04/28/19  [name], NP    Family History Family History  Problem Relation Age of Onset  . Diabetes Mother   . Hypertension Mother   . Throat cancer Maternal Aunt   . Cirrhosis Maternal Grandfather   . Colon cancer Neg Hx   . Colon polyps Neg Hx   . Kidney disease Neg Hx     Social History Social History   Tobacco Use  . Smoking status: Light Tobacco Smoker    Packs/day: 0.15    Years: 10.00    Pack years: 1.50    Types: Cigarettes    Last attempt to quit: 05/26/2015    Years since quitting: 3.9  . Smokeless tobacco: Never Used  . Tobacco comment: smokes when he drinks  Substance Use Topics  . Alcohol use: Yes    Comment: Occassionally  . Drug use: No     Allergies   Mushroom extract complex, Shrimp [shellfish allergy], and Tomato   Review of  Systems Review of Systems  Genitourinary: Negative for decreased urine volume, difficulty urinating, discharge, dysuria, enuresis, flank pain, frequency, genital sores, hematuria, penile pain, penile swelling, scrotal swelling, testicular pain and urgency.     Physical Exam Triage Vital Signs ED Triage Vitals  Enc Vitals Group     BP 04/28/19 1211 (!) 165/100     Pulse Rate 04/28/19 1211 83     Resp 04/28/19 1211 20     Temp 04/28/19 1211 99.1 F (37.3 C)     Temp Source 04/28/19 1211 Oral     SpO2 04/28/19 1211 98 %     Weight --      Height --      Head Circumference --      Peak Flow --      Pain Score 04/28/19 1209 0     Pain Loc --      Pain  Edu? --      Excl. in [place]? --    No data found.  Updated Vital Signs BP (!) 144/96 (BP Location: Left Arm) Comment (BP Location): large cuff, repositioning for repeat blood pressure  Pulse 83   Temp 99.1 F (37.3 C) (Oral)   Resp 20   SpO2 98%   Visual Acuity Right Eye Distance:   Left Eye Distance:   Bilateral Distance:    Right Eye Near:   Left Eye Near:    Bilateral Near:     Physical Exam Vitals signs and nursing note reviewed.  Constitutional:      Appearance: Normal appearance.  HENT:     Head: Normocephalic and atraumatic.     Nose: Nose normal.  Eyes:     Conjunctiva/sclera: Conjunctivae normal.  Neck:     Musculoskeletal: Normal range of motion.  Pulmonary:     Effort: Pulmonary effort is normal.  Abdominal:     Palpations: Abdomen is soft.     Tenderness: There is no abdominal tenderness.  Musculoskeletal: Normal range of motion.  Skin:    General: Skin is warm and dry.  Neurological:     Mental Status: He is alert.  Psychiatric:        Mood and Affect: Mood normal.      UC Treatments / Results  Labs (all labs ordered are listed, but only abnormal results are displayed) Labs Reviewed  HIV ANTIBODY (ROUTINE TESTING W REFLEX)  RPR  URINE CYTOLOGY ANCILLARY ONLY    EKG None  Radiology No results found.  Procedures Procedures (including critical care time)  Medications Ordered in UC Medications - No data to display  Initial Impression / Assessment and Plan / UC Course  I have reviewed the triage vital signs and the nursing notes.  Pertinent labs & imaging results that were available during my care of the patient were reviewed by me and considered in my medical decision making (see chart for details).      Screening for STDs Labs pending Final Clinical Impressions(s) / UC Diagnoses   Final diagnoses:  Exposure to STD     Discharge Instructions     We are sending your urine for testing.  We will call with any positive  results.  You can also check your MyChart for results    ED Prescriptions    None     Controlled Substance Prescriptions [place] Controlled Substance Registry consulted? Not Applicable   Orvan July, NP 04/28/19 1248

## 2019-04-29 LAB — URINE CYTOLOGY ANCILLARY ONLY
Chlamydia: NEGATIVE
Neisseria Gonorrhea: NEGATIVE
Trichomonas: NEGATIVE

## 2019-04-29 LAB — RPR: RPR Ser Ql: NONREACTIVE

## 2019-04-29 LAB — HIV ANTIBODY (ROUTINE TESTING W REFLEX): HIV Screen 4th Generation wRfx: NONREACTIVE

## 2019-05-05 DIAGNOSIS — Z79899 Other long term (current) drug therapy: Secondary | ICD-10-CM | POA: Insufficient documentation

## 2019-05-05 DIAGNOSIS — F17210 Nicotine dependence, cigarettes, uncomplicated: Secondary | ICD-10-CM | POA: Insufficient documentation

## 2019-05-05 DIAGNOSIS — M25561 Pain in right knee: Secondary | ICD-10-CM | POA: Insufficient documentation

## 2019-05-05 DIAGNOSIS — I10 Essential (primary) hypertension: Secondary | ICD-10-CM | POA: Insufficient documentation

## 2019-05-05 DIAGNOSIS — E119 Type 2 diabetes mellitus without complications: Secondary | ICD-10-CM | POA: Insufficient documentation

## 2019-05-05 NOTE — ED Triage Notes (Signed)
Pt complains of right knee pain for three days, he works on the [place] of a [place] and thinks he may have injured it there

## 2019-05-06 ENCOUNTER — Encounter (HOSPITAL_COMMUNITY): Payer: Self-pay

## 2019-05-06 ENCOUNTER — Emergency Department (HOSPITAL_COMMUNITY): Payer: Self-pay

## 2019-05-06 ENCOUNTER — Emergency Department (HOSPITAL_COMMUNITY)
Admission: EM | Admit: 2019-05-06 | Discharge: 2019-05-06 | Disposition: A | Discharge status: Home / Self Care | Payer: Self-pay | Attending: Emergency Medicine | Admitting: Emergency Medicine

## 2019-05-06 DIAGNOSIS — M25561 Pain in right knee: Secondary | ICD-10-CM

## 2019-05-06 NOTE — ED Notes (Signed)
Pt ambulated from triage to pt room.

## 2019-05-06 NOTE — ED Provider Notes (Signed)
[facility] DEPT Provider Note   CSN: [id number] Arrival date & time: 05/05/19  2250     History   Chief Complaint Chief Complaint  Patient presents with  . Knee Pain    HPI Jonathan Taylor is a 33 y.o. male.     The history is provided by the patient and medical records.  Knee Pain    33 year old male with history of diabetes, GERD, presenting to the ED with right knee pain.  States he works for a [employer]) and work has been "crazy" lately.  States he works 12-hour days 5 days a week.  Reports on Thursday he had to manually move a 2000 pound trailer and felt some popping in his knee.  States since that time he had worsening pain.  States knee is not giving out on him but does not feel stable.  Pain is worse with weight bearing and ambulation.  No numbness/weakness.  States similar issues on left knee in the past which required surgery.  He is established with orthopedics, Dr. Griffin Basil.  No meds tried PTA.  Past Medical History:  Diagnosis Date  . Blood in stool   . Diabetes mellitus without complication ([place])   . GERD (gastroesophageal reflux disease)   . History of anal fissures   . Low sperm motility     Patient Active Problem List   Diagnosis Date Noted  . OSA (obstructive sleep apnea) 11/20/2017  . Low testosterone in male 07/11/2017  . Snoring 07/11/2017  . Chest pain with low risk for cardiac etiology 04/29/2017  . Erectile dysfunction associated with type 2 diabetes mellitus ([place]) 10/02/2016  . Essential hypertension 10/02/2016  . Panic attacks 12/02/2015  . Sleep disturbance 10/07/2015  . Possible exposure to STD 10/07/2015  . Diabetes mellitus type 2 in obese ([place]) 03/14/2015  . Male fertility problem 03/14/2015  . Obesity, morbid, BMI 40.0-49.9 ([place]) 11/17/2014  . Anal fistula 09/12/2011    Past Surgical History:  Procedure Laterality Date  . ANAL FISTULECTOMY  2012   with sphhincterotomy  . ANAL FISTULOTOMY N/A  06/29/2015   Procedure: EXCISION PERI RECTAL SCAR/FISTULA, INTERNAL HEMMORRHOIDAL LIGATION, PEXY, MARSUPIALIZATION;  Surgeon: Michael Boston, MD;  [facility];  Service: General;  Laterality: N/A;  . EXAMINATION UNDER ANESTHESIA N/A 06/29/2015   Procedure: EXAM UNDER ANESTHESIA;  Surgeon: Michael Boston, MD;  Location: [facility];  Service: General;  Laterality: N/A;  . KNEE SURGERY  07/07/2018  . RECTAL SURGERY  08/2010   Dr Zenia Resides        Home Medications    Prior to Admission medications   Medication Sig Start Date End Date Taking? Authorizing Provider  albuterol (PROVENTIL HFA;VENTOLIN HFA) 108 (90 Base) MCG/ACT inhaler Inhale 1-2 puffs into the lungs every 6 (six) hours as needed for wheezing or shortness of breath. 01/25/18 04/28/19  Langston Masker B, PA-C  atorvastatin (LIPITOR) 20 MG tablet Take 1 tablet (20 mg total) by mouth daily. 01/21/19 04/28/19  [name], NP  VIAGRA 100 MG tablet Take 1 tablet (100 mg total) by mouth daily as needed for erectile dysfunction. 09/04/18 04/28/19  [name], NP    Family History Family History  Problem Relation Age of Onset  . Diabetes Mother   . Hypertension Mother   . Throat cancer Maternal Aunt   . Cirrhosis Maternal Grandfather   . Colon cancer Neg Hx   . Colon polyps Neg Hx   . Kidney disease Neg Hx  Social History Social History   Tobacco Use  . Smoking status: Light Tobacco Smoker    Packs/day: 0.15    Years: 10.00    Pack years: 1.50    Types: Cigarettes    Last attempt to quit: 05/26/2015    Years since quitting: 3.9  . Smokeless tobacco: Never Used  . Tobacco comment: smokes when he drinks  Substance Use Topics  . Alcohol use: Yes    Comment: Occassionally  . Drug use: No     Allergies   Mushroom extract complex, Shrimp [shellfish allergy], and Tomato   Review of Systems Review of Systems  Musculoskeletal: Positive for arthralgias.  All other systems reviewed and are negative.    Physical  Exam Updated Vital Signs BP (!) 154/114 (BP Location: Left Arm)   Pulse 78   Temp 98.4 F (36.9 C) (Oral)   Resp 18   Ht 6\' 6"  (1.981 m)   Wt 136.1 kg   SpO2 94%   BMI 34.67 kg/m   Physical Exam Vitals signs and nursing note reviewed.  Constitutional:      Appearance: He is well-developed.  HENT:     Head: Normocephalic and atraumatic.  Eyes:     Conjunctiva/sclera: Conjunctivae normal.     Pupils: Pupils are equal, round, and reactive to light.  Neck:     Musculoskeletal: Normal range of motion.  Cardiovascular:     Rate and Rhythm: Normal rate and regular rhythm.     Heart sounds: Normal heart sounds.  Pulmonary:     Effort: Pulmonary effort is normal.     Breath sounds: Normal breath sounds.  Abdominal:     General: Bowel sounds are normal.     Palpations: Abdomen is soft.  Musculoskeletal: Normal range of motion.     Comments: Right knee without any significant swelling or joint effusion, no bony deformity, able to flex and extend knee, there is crepitus noted, no ligamentous laxity, negative anterior and posterior drawer tests, leg is neurovascular intact with normal muscle tone, remains ambulatory  Skin:    General: Skin is warm and dry.  Neurological:     Mental Status: He is alert and oriented to person, place, and time.      ED Treatments / Results  Labs (all labs ordered are listed, but only abnormal results are displayed) Labs Reviewed - No data to display  EKG    Radiology Dg Knee Complete 4 Views Right  Result Date: 05/06/2019 CLINICAL DATA:  Swelling EXAM: RIGHT KNEE - COMPLETE 4+ VIEW COMPARISON:  None. FINDINGS: Moderate tricompartment degenerative changes with joint space narrowing and spurring most pronounced in the lateral compartment. No acute bony abnormality. Specifically, no fracture, subluxation, or dislocation. Small joint effusion. IMPRESSION: Tricompartment degenerative changes. Small joint effusion. No acute bony abnormality.  Electronically Signed   By: Rolm Baptise M.D.   On: 05/06/2019 01:21    Procedures Procedures (including critical care time)  Medications Ordered in ED Medications - No data to display   Initial Impression / Assessment and Plan / ED Course  I have reviewed the triage vital signs and the nursing notes.  Pertinent labs & imaging results that were available during my care of the patient were reviewed by me and considered in my medical decision making (see chart for details).  33 year old male here with right knee pain.  Thinks he injured it a few days ago at work while manually moving a 2000 pound trailer.  Pain with weightbearing and ambulation.  No recent falls or other trauma.  No bony deformity or significant swelling on exam.  He does have full range of motion but crepitus noted with flexion extension.  No ligamentous laxity.  Leg is neurovascular intact.  He remains ambulatory here in ED.  X-ray with tricompartmental degenerative changes with small joint effusion, no acute bony findings.  Patient placed in knee sleeve.  Encouraged ice, elevation, anti-inflammatories.  He will need to follow-up with his orthopedist.  He was given work note.  Return here for any new or acute changes.  Final Clinical Impressions(s) / ED Diagnoses   Final diagnoses:  Acute pain of right knee    ED Discharge Orders    None       Larene Pickett, PA-C 05/06/19 0304    Fatima Blank, MD 05/06/19 [phone]

## 2019-05-06 NOTE — Discharge Instructions (Signed)
Recommend motrin 800mg  up to 3x daily. Take with food. Follow-up with Dr. Griffin Basil. Return here for any new/acute changes.

## 2019-05-08 ENCOUNTER — Other Ambulatory Visit: Payer: Self-pay

## 2019-05-08 ENCOUNTER — Ambulatory Visit (HOSPITAL_COMMUNITY)
Admission: EM | Admit: 2019-05-08 | Discharge: 2019-05-08 | Disposition: A | Discharge status: Home / Self Care | Payer: 59 | Attending: Family Medicine | Admitting: Family Medicine

## 2019-05-08 ENCOUNTER — Encounter (HOSPITAL_COMMUNITY): Payer: Self-pay | Admitting: Emergency Medicine

## 2019-05-08 DIAGNOSIS — S8991XS Unspecified injury of right lower leg, sequela: Secondary | ICD-10-CM

## 2019-05-08 DIAGNOSIS — M25461 Effusion, right knee: Secondary | ICD-10-CM

## 2019-05-08 MED ORDER — METHYLPREDNISOLONE 4 MG PO TBPK: ORAL_TABLET | ORAL | 0 refills | Status: DC

## 2019-05-08 NOTE — ED Provider Notes (Signed)
[place]    CSN: [id number] Arrival date & time: 05/08/19  1651     History   Chief Complaint Chief Complaint  Patient presents with  . Follow-up    HPI Jonathan Taylor is a 33 y.o. male.   HPI  Patient is here for right knee pain.  He has known arthritis in his knee.  He has a very physically demanding job.  His knee will periodically bother him.  He tends to power through the pain and continue to work.  Patient was seen in the emergency room for knee pain on June 30/2020.  He states that he has 2 move very heavy objects at work and moves [employer] thousands of pounds, and 1 of them range in the wrong direction and he had to pull back on it, causing a twist and pop in his knee.  His knee has been painful ever since then.  The following day his boss sent him home from work early because he was limping with knee pain.  Patient has not reported this is a work-related injury.  I have explained to him that if there was a definable incident, it may be compensable and he should let his company know. He does have some underlying arthritis.  He knows this.  He continues to work in spite of this.  He has never let this stop him from doing his job.  He has never had pain this severe with his usual arthritic complaints. When he went to the emergency room they did x-rays.  Put on a knee sleeve.  Told him to take ibuprofen.  Gave him a couple days off.  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  Past Medical History:  Diagnosis Date  . Blood in stool   . Diabetes mellitus without complication ([place])   . GERD (gastroesophageal reflux disease)   . History of anal fissures   . Low sperm motility     Patient Active Problem List   Diagnosis Date Noted  . OSA (obstructive sleep apnea) 11/20/2017  . Low testosterone in male 07/11/2017  . Snoring 07/11/2017  . Chest pain with low risk for cardiac etiology 04/29/2017  . Erectile  dysfunction associated with type 2 diabetes mellitus ([place]) 10/02/2016  . Essential hypertension 10/02/2016  . Panic attacks 12/02/2015  . Sleep disturbance 10/07/2015  . Possible exposure to STD 10/07/2015  . Diabetes mellitus type 2 in obese ([place]) 03/14/2015  . Male fertility problem 03/14/2015  . Obesity, morbid, BMI 40.0-49.9 ([place]) 11/17/2014  . Anal fistula 09/12/2011    Past Surgical History:  Procedure Laterality Date  . ANAL FISTULECTOMY  2012   with sphhincterotomy  . ANAL FISTULOTOMY N/A 06/29/2015   Procedure: EXCISION PERI RECTAL SCAR/FISTULA, INTERNAL HEMMORRHOIDAL LIGATION, PEXY, MARSUPIALIZATION;  Surgeon: Michael Boston, MD;  Location: [facility];  Service: General;  Laterality: N/A;  . EXAMINATION UNDER ANESTHESIA N/A 06/29/2015   Procedure: EXAM UNDER ANESTHESIA;  Surgeon: Michael Boston, MD;  Location: [facility];  Service: General;  Laterality: N/A;  . KNEE SURGERY  07/07/2018  . RECTAL SURGERY  08/2010   Dr Zenia Resides       Home Medications    Prior to Admission medications   Medication Sig Start Date End Date Taking? Authorizing Provider  methylPREDNISolone (MEDROL DOSEPAK) 4 MG TBPK tablet tad 05/08/19   Raylene Everts, MD  albuterol (PROVENTIL HFA;VENTOLIN HFA) 108 (90 Base) MCG/ACT inhaler Inhale  1-2 puffs into the lungs every 6 (six) hours as needed for wheezing or shortness of breath. 01/25/18 04/28/19  Langston Masker B, PA-C  atorvastatin (LIPITOR) 20 MG tablet Take 1 tablet (20 mg total) by mouth daily. 01/21/19 04/28/19  [name], NP  VIAGRA 100 MG tablet Take 1 tablet (100 mg total) by mouth daily as needed for erectile dysfunction. 09/04/18 04/28/19  [name], NP    Family History Family History  Problem Relation Age of Onset  . Diabetes Mother   . Hypertension Mother   . Throat cancer Maternal Aunt   . Cirrhosis Maternal Grandfather   . Colon cancer Neg Hx   . Colon polyps Neg Hx   . Kidney disease Neg Hx     Social History Social  History   Tobacco Use  . Smoking status: Light Tobacco Smoker    Packs/day: 0.15    Years: 10.00    Pack years: 1.50    Types: Cigarettes    Last attempt to quit: 05/26/2015    Years since quitting: 3.9  . Smokeless tobacco: Never Used  . Tobacco comment: smokes when he drinks  Substance Use Topics  . Alcohol use: Yes    Comment: Occassionally  . Drug use: No     Allergies   Mushroom extract complex, Shrimp [shellfish allergy], Tomato, and Tylenol [acetaminophen]   Review of Systems Review of Systems  Constitutional: Negative for chills and fever.  HENT: Negative for ear pain and sore throat.   Eyes: Negative for pain and visual disturbance.  Respiratory: Negative for cough and shortness of breath.   Cardiovascular: Negative for chest pain and palpitations.  Gastrointestinal: Negative for abdominal pain and vomiting.  Genitourinary: Negative for dysuria and hematuria.  Musculoskeletal: Positive for arthralgias and gait problem. Negative for back pain.  Skin: Negative for color change and rash.  Neurological: Negative for seizures and syncope.  All other systems reviewed and are negative.    Physical Exam Triage Vital Signs ED Triage Vitals  Enc Vitals Group     BP 05/08/19 1740 (!) 151/101     Pulse Rate 05/08/19 1740 80     Resp 05/08/19 1740 16     Temp 05/08/19 1740 98.7 F (37.1 C)     Temp Source 05/08/19 1740 Oral     SpO2 05/08/19 1740 100 %     Weight --      Height --      Head Circumference --      Peak Flow --      Pain Score 05/08/19 1737 9     Pain Loc --      Pain Edu? --      Excl. in [place]? --    No data found.  Updated Vital Signs BP (!) 151/101 (BP Location: Left Arm)   Pulse 80   Temp 98.7 F (37.1 C) (Oral)   Resp 16   SpO2 100%   Visual Acuity Right Eye Distance:   Left Eye Distance:   Bilateral Distance:    Right Eye Near:   Left Eye Near:    Bilateral Near:     Physical Exam Constitutional:      General: He is not in  acute distress.    Appearance: He is well-developed. He is obese.     Comments: Appears uncomfortable  HENT:     Head: Normocephalic and atraumatic.  Eyes:     Conjunctiva/sclera: Conjunctivae normal.     Pupils: Pupils are  equal, round, and reactive to light.  Neck:     Musculoskeletal: Normal range of motion.  Cardiovascular:     Rate and Rhythm: Normal rate.  Pulmonary:     Effort: Pulmonary effort is normal. No respiratory distress.  Abdominal:     General: There is no distension.     Palpations: Abdomen is soft.  Musculoskeletal: Normal range of motion.     Comments: Right knee sleeve is in place.  There is moderate effusion.  Joint line tenderness lateral greater than medial.  Crepitus with patellofemoral grind testing.  Lacks full extension and flexion.  No instability  Skin:    General: Skin is warm and dry.  Neurological:     Mental Status: He is alert.  Psychiatric:        Mood and Affect: Mood normal.        Behavior: Behavior normal.      UC Treatments / Results  Labs (all labs ordered are listed, but only abnormal results are displayed) Labs Reviewed - No data to display  EKG   Radiology No results found.  Procedures Procedures (including critical care time)  Medications Ordered in UC Medications - No data to display  Initial Impression / Assessment and Plan / UC Course  I have reviewed the triage vital signs and the nursing notes.  Pertinent labs & imaging results that were available during my care of the patient were reviewed by me and considered in my medical decision making (see chart for details).     Giving the patient steroids.  Ice.  Rest.  Hopefully this will take down his inflammation.  He is going to give consideration to talking to his workplace about a Sport and exercise psychologist. report. Final Clinical Impressions(s) / UC Diagnoses   Final diagnoses:  Knee injury, right, sequela  Effusion of knee joint right     Discharge Instructions      Take the Medrol Dosepak as directed This is an oral steroid to take down the swelling and inflammation Take all of day 1 today (3 now and 3 at bedtime) Continue to reduce standing and walking, rest the leg Ice for 20 minutes every few hours Return if not better by Monday afternoon  You need to consider discussing with your employer your work incident that caused the knee pop and pain    ED Prescriptions    Medication Sig Dispense Auth. Provider   methylPREDNISolone (MEDROL DOSEPAK) 4 MG TBPK tablet tad 21 tablet Raylene Everts, MD     Controlled Substance Prescriptions [place] Controlled Substance Registry consulted? Not Applicable   Raylene Everts, MD 05/08/19 Jeri Lager

## 2019-05-08 NOTE — ED Triage Notes (Signed)
Pt is here for continued right knee pain.  The ED at [facility] gave him a work excuse through today, but pt would like a note that writes him out until Monday.  He states he is still having pain and swelling in the knee.  Pt has the knee sleeve on he was given in the ED.  Pt is taking 800 mg of ibuprofen, his last dose was this morning.

## 2019-05-08 NOTE — Discharge Instructions (Addendum)
Take the Medrol Dosepak as directed This is an oral steroid to take down the swelling and inflammation Take all of day 1 today (3 now and 3 at bedtime) Continue to reduce standing and walking, rest the leg Ice for 20 minutes every few hours Return if not better by Monday afternoon  You need to consider discussing with your employer your work incident that caused the knee pop and pain

## 2019-05-16 ENCOUNTER — Encounter: Payer: Self-pay | Admitting: Nurse Practitioner

## 2019-05-16 ENCOUNTER — Other Ambulatory Visit: Payer: Self-pay

## 2019-05-16 ENCOUNTER — Ambulatory Visit (INDEPENDENT_AMBULATORY_CARE_PROVIDER_SITE_OTHER): Payer: Self-pay | Admitting: Nurse Practitioner

## 2019-05-16 VITALS — BP 142/98 | HR 98 | Temp 99.1°F | Ht 78.0 in | Wt 326.0 lb

## 2019-05-16 DIAGNOSIS — N529 Male erectile dysfunction, unspecified: Secondary | ICD-10-CM

## 2019-05-16 DIAGNOSIS — E1169 Type 2 diabetes mellitus with other specified complication: Secondary | ICD-10-CM

## 2019-05-16 DIAGNOSIS — I10 Essential (primary) hypertension: Secondary | ICD-10-CM

## 2019-05-16 DIAGNOSIS — E669 Obesity, unspecified: Secondary | ICD-10-CM

## 2019-05-16 DIAGNOSIS — E785 Hyperlipidemia, unspecified: Secondary | ICD-10-CM

## 2019-05-16 MED ORDER — VIAGRA 100 MG PO TABS: 100.0000 mg | ORAL_TABLET | Freq: Every day | ORAL | 0 refills | Status: DC | PRN

## 2019-05-16 MED ORDER — ATORVASTATIN CALCIUM 20 MG PO TABS: 20.0000 mg | ORAL_TABLET | Freq: Every day | ORAL | 1 refills | Status: DC

## 2019-05-16 MED ORDER — LOSARTAN POTASSIUM 25 MG PO TABS: 25.0000 mg | ORAL_TABLET | Freq: Every day | ORAL | 0 refills | Status: DC

## 2019-05-16 NOTE — Progress Notes (Signed)
Careteam: Patient Care Team: [name], NP as PCP - General (Geriatric Medicine) Melissa Montane, MD as Consulting Physician (Otolaryngology)  Advanced Directive information    Allergies  Allergen Reactions  . Mushroom Extract Complex Anaphylaxis and Swelling    Lips  . Shrimp [Shellfish Allergy] Anaphylaxis  . Tomato Anaphylaxis  . Tylenol [Acetaminophen] Swelling    Chief Complaint  Patient presents with  . Follow-up    Blood pressure follow-up.      HPI: Patient is a 33 y.o. male seen in the office today for blood pressure follow up   Reports he lost his job since last visit, was able to keep his insurance until May. He went to get another job and then had to be out of work for another week. Reports he feels like he pulled something in his leg lifting heavy objects.   Does not have Insurance until 06/07/2019 and has been out of medication.   DM- has not seen endocrinologist in a while.   ED-trying to have a baby, having some issues with Ed, has been out of viagra and was unable to get refill due to missing appts.   Review of Systems:  Review of Systems  Constitutional: Negative for chills, fever and weight loss.  HENT: Negative for tinnitus.   Respiratory: Negative for cough, sputum production and shortness of breath.   Cardiovascular: Negative for chest pain, palpitations and leg swelling.  Gastrointestinal: Negative for abdominal pain, constipation, diarrhea and heartburn.  Genitourinary: Negative for dysuria, frequency and urgency.  Musculoskeletal: Positive for joint pain (left knee surgery, now with pain to right knee). Negative for back pain, falls and myalgias.  Skin: Negative.   Neurological: Negative for dizziness and headaches.  Psychiatric/Behavioral: Negative for depression and memory loss. The patient does not have insomnia.     Past Medical History:  Diagnosis Date  . Blood in stool   . Diabetes mellitus without complication ([place])   .  GERD (gastroesophageal reflux disease)   . History of anal fissures   . Low sperm motility    Past Surgical History:  Procedure Laterality Date  . ANAL FISTULECTOMY  2012   with sphhincterotomy  . ANAL FISTULOTOMY N/A 06/29/2015   Procedure: EXCISION PERI RECTAL SCAR/FISTULA, INTERNAL HEMMORRHOIDAL LIGATION, PEXY, MARSUPIALIZATION;  Surgeon: Michael Boston, MD;  Location: [facility];  Service: General;  Laterality: N/A;  . EXAMINATION UNDER ANESTHESIA N/A 06/29/2015   Procedure: EXAM UNDER ANESTHESIA;  Surgeon: Michael Boston, MD;  Location: [facility];  Service: General;  Laterality: N/A;  . KNEE SURGERY  07/07/2018  . RECTAL SURGERY  08/2010   Dr Zenia Resides   Social History:   reports that he has been smoking cigarettes. He has a 1.50 pack-year smoking history. He has never used smokeless tobacco. He reports current alcohol use. He reports that he does not use drugs.  Family History  Problem Relation Age of Onset  . Diabetes Mother   . Hypertension Mother   . Throat cancer Maternal Aunt   . Cirrhosis Maternal Grandfather   . Colon cancer Neg Hx   . Colon polyps Neg Hx   . Kidney disease Neg Hx     Medications: Patient's Medications  New Prescriptions   No medications on file  Previous Medications   METHYLPREDNISOLONE (MEDROL DOSEPAK) 4 MG TBPK TABLET    tad  Modified Medications   No medications on file  Discontinued Medications   No medications on file    Physical Exam:  Vitals:  05/16/19 1128  BP: (!) 142/98  Pulse: 98  Temp: 99.1 F (37.3 C)  TempSrc: Oral  SpO2: 96%  Weight: (!) 326 lb (147.9 kg)  Height: 6\' 6"  (1.981 m)   Body mass index is 37.67 kg/m. Wt Readings from Last 3 Encounters:  05/16/19 (!) 326 lb (147.9 kg)  05/05/19 300 lb (136.1 kg)  01/21/19 (!) 359 lb (162.8 kg)    Physical Exam Constitutional:      Appearance: Normal appearance.  HENT:     Head: Normocephalic and atraumatic.  Neck:     Musculoskeletal: Normal range of motion.   Cardiovascular:     Rate and Rhythm: Normal rate and regular rhythm.     Pulses: Normal pulses.  Pulmonary:     Effort: Pulmonary effort is normal.     Breath sounds: Normal breath sounds.  Skin:    General: Skin is warm and dry.  Neurological:     General: No focal deficit present.     Mental Status: He is alert and oriented to person, place, and time.  Psychiatric:        Mood and Affect: Mood normal.     Labs reviewed: Basic Metabolic Panel: Recent Labs    07/29/18 1108 09/18/18 0841 01/21/19 1027  NA 138 139 140  K 3.8 3.8 4.1  CL 105 107 105  CO2 25 25 26   GLUCOSE 195* 151* 139*  BUN 12 13 10   CREATININE 1.01 1.00 1.00  CALCIUM 9.7 9.2 9.4   Liver Function Tests: Recent Labs    07/29/18 1108 01/21/19 1027  AST 19 29  ALT 23 31  ALKPHOS 70  --   BILITOT 0.5 0.4  PROT 7.3 6.5  ALBUMIN 4.0  --    No results for input(s): LIPASE, AMYLASE in the last 8760 hours. No results for input(s): AMMONIA in the last 8760 hours. CBC: Recent Labs    01/21/19 1027  WBC 6.0  NEUTROABS 3,456  HGB 13.9  HCT 41.5  MCV 78.0*  PLT 257   Lipid Panel: Recent Labs    07/29/18 1108 01/21/19 1027  CHOL 150 146  HDL 39.30 46  LDLCALC 101* 86  TRIG 48.0 48  CHOLHDL 4 3.2   TSH: No results for input(s): TSH in the last 8760 hours. A1C: Lab Results  Component Value Date   HGBA1C 7.5 (H) 01/21/2019     Assessment/Plan 1. Hyperlipidemia, unspecified hyperlipidemia type - Lipid Panel; Future - atorvastatin (LIPITOR) 20 MG tablet; Take 1 tablet (20 mg total) by mouth daily.  Dispense: 90 tablet; Refill: 1- given per the 4$ list at [place].   2. Diabetes mellitus type 2 in obese [facility]) -pt currently is without insurance but new insurance will pick up next month, he has worked hard to manage diabetes with diet. - COMPLETE METABOLIC PANEL WITH GFR; Future - CBC with Differential/Platelet; Future - Hemoglobin A1c; Future due to insurance  4. Essential hypertension  -continues to work on lifestyle modifications, due to being diabetic and blood pressure not at goal.  - losartan (COZAAR) 25 MG tablet; Take 1 tablet (25 mg total) by mouth daily.  Dispense: 90 tablet; Refill: 0 - COMPLETE METABOLIC PANEL WITH GFR; Future - CBC with Differential/Platelet; Future  5. Erectile dysfunction, unspecified erectile dysfunction type -encouraged proper control of blood pressure and blood sugar - VIAGRA 100 MG tablet; Take 1 tablet (100 mg total) by mouth daily as needed for erectile dysfunction.  Dispense: 6 tablet; Refill: 0  6. Morbid  obesity ([place]) He has lost weight which is great, encouraged to continue lifestyle modifications to continue healthy weight loss.   Next appt: 6 weeks, blood work prior  [employer]. [place], [facility] [phone]

## 2019-05-16 NOTE — Patient Instructions (Signed)
Make appt for blood work in 1 month.   6 week follow up   [place] stands for "Dietary Approaches to Stop Hypertension." The DASH eating plan is a healthy eating plan that has been shown to reduce high blood pressure (hypertension). It may also reduce your risk for type 2 diabetes, heart disease, and stroke. The DASH eating plan may also help with weight loss. What are tips for following this plan?  General guidelines  Avoid eating more than 2,300 mg (milligrams) of salt (sodium) a day. If you have hypertension, you may need to reduce your sodium intake to 1,500 mg a day.  Limit alcohol intake to no more than 1 drink a day for nonpregnant women and 2 drinks a day for men. One drink equals 12 oz of beer, 5 oz of wine, or 1 oz of hard liquor.  Work with your health care provider to maintain a healthy body weight or to lose weight. Ask what an ideal weight is for you.  Get at least 30 minutes of exercise that causes your heart to beat faster (aerobic exercise) most days of the week. Activities may include walking, swimming, or biking.  Work with your health care provider or diet and nutrition specialist (dietitian) to adjust your eating plan to your individual calorie needs. Reading food labels   Check food labels for the amount of sodium per serving. Choose foods with less than 5 percent of the Daily Value of sodium. Generally, foods with less than 300 mg of sodium per serving fit into this eating plan.  To find whole grains, look for the word "whole" as the first word in the ingredient list. Shopping  Buy products labeled as "low-sodium" or "no salt added."  Buy fresh foods. Avoid canned foods and premade or frozen meals. Cooking  Avoid adding salt when cooking. Use salt-free seasonings or herbs instead of table salt or sea salt. Check with your health care provider or pharmacist before using salt substitutes.  Do not fry foods. Cook foods using healthy methods such as  baking, boiling, grilling, and broiling instead.  Cook with heart-healthy oils, such as olive, canola, soybean, or sunflower oil. Meal planning  Eat a balanced diet that includes: ? 5 or more servings of fruits and vegetables each day. At each meal, try to fill half of your plate with fruits and vegetables. ? Up to 6-8 servings of whole grains each day. ? Less than 6 oz of lean meat, poultry, or fish each day. A 3-oz serving of meat is about the same size as a deck of cards. One egg equals 1 oz. ? 2 servings of low-fat dairy each day. ? A serving of nuts, seeds, or beans 5 times each week. ? Heart-healthy fats. Healthy fats called Omega-3 fatty acids are found in foods such as flaxseeds and coldwater fish, like sardines, salmon, and mackerel.  Limit how much you eat of the following: ? Canned or prepackaged foods. ? Food that is high in trans fat, such as fried foods. ? Food that is high in saturated fat, such as fatty meat. ? Sweets, desserts, sugary drinks, and other foods with added sugar. ? Full-fat dairy products.  Do not salt foods before eating.  Try to eat at least 2 vegetarian meals each week.  Eat more home-cooked food and less restaurant, buffet, and fast food.  When eating at a restaurant, ask that your food be prepared with less salt or no salt, if possible. What foods  are recommended? The items listed may not be a complete list. Talk with your dietitian about what dietary choices are best for you. Grains Whole-grain or whole-wheat bread. Whole-grain or whole-wheat pasta. Brown rice. [place]. Bulgur. Whole-grain and low-sodium cereals. Pita bread. Low-fat, low-sodium crackers. Whole-wheat flour tortillas. Vegetables Fresh or frozen vegetables (raw, steamed, roasted, or grilled). Low-sodium or reduced-sodium tomato and vegetable juice. Low-sodium or reduced-sodium tomato sauce and tomato paste. Low-sodium or reduced-sodium canned vegetables. Fruits All fresh,  dried, or frozen fruit. Canned fruit in natural juice (without added sugar). Meat and other protein foods Skinless chicken or [place]. Ground chicken or [place]. Pork with fat trimmed off. Fish and seafood. Egg whites. Dried beans, peas, or lentils. Unsalted nuts, nut butters, and seeds. Unsalted canned beans. Lean cuts of beef with fat trimmed off. Low-sodium, lean deli meat. Dairy Low-fat (1%) or fat-free (skim) milk. Fat-free, low-fat, or reduced-fat cheeses. Nonfat, low-sodium ricotta or cottage cheese. Low-fat or nonfat yogurt. Low-fat, low-sodium cheese. Fats and oils Soft margarine without trans fats. Vegetable oil. Low-fat, reduced-fat, or light mayonnaise and salad dressings (reduced-sodium). Canola, safflower, olive, soybean, and sunflower oils. Avocado. Seasoning and other foods Herbs. Spices. Seasoning mixes without salt. Unsalted popcorn and pretzels. Fat-free sweets. What foods are not recommended? The items listed may not be a complete list. Talk with your dietitian about what dietary choices are best for you. Grains Baked goods made with fat, such as croissants, muffins, or some breads. Dry pasta or rice meal packs. Vegetables Creamed or fried vegetables. Vegetables in a cheese sauce. Regular canned vegetables (not low-sodium or reduced-sodium). Regular canned tomato sauce and paste (not low-sodium or reduced-sodium). Regular tomato and vegetable juice (not low-sodium or reduced-sodium). Angie Fava. Olives. Fruits Canned fruit in a light or heavy syrup. Fried fruit. Fruit in cream or butter sauce. Meat and other protein foods Fatty cuts of meat. Ribs. Fried meat. Berniece Salines. Sausage. Bologna and other processed lunch meats. Salami. Fatback. Hotdogs. Bratwurst. Salted nuts and seeds. Canned beans with added salt. Canned or smoked fish. Whole eggs or egg yolks. Chicken or [place] with skin. Dairy Whole or 2% milk, cream, and half-and-half. Whole or full-fat cream cheese. Whole-fat or sweetened  yogurt. Full-fat cheese. Nondairy creamers. Whipped toppings. Processed cheese and cheese spreads. Fats and oils Butter. Stick margarine. Lard. Shortening. Ghee. Bacon fat. Tropical oils, such as coconut, palm kernel, or palm oil. Seasoning and other foods Salted popcorn and pretzels. Onion salt, garlic salt, seasoned salt, table salt, and sea salt. Worcestershire sauce. Tartar sauce. Barbecue sauce. Teriyaki sauce. Soy sauce, including reduced-sodium. Steak sauce. Canned and packaged gravies. Fish sauce. Oyster sauce. Cocktail sauce. Horseradish that you find on the shelf. Ketchup. Mustard. Meat flavorings and tenderizers. Bouillon cubes. Hot sauce and Tabasco sauce. Premade or packaged marinades. Premade or packaged taco seasonings. Relishes. Regular salad dressings. Where to find more information:  National Heart, Lung, and [place]: https://wilson-eaton.com/  American Heart Association: www.heart.org Summary  The DASH eating plan is a healthy eating plan that has been shown to reduce high blood pressure (hypertension). It may also reduce your risk for type 2 diabetes, heart disease, and stroke.  With the DASH eating plan, you should limit salt (sodium) intake to 2,300 mg a day. If you have hypertension, you may need to reduce your sodium intake to 1,500 mg a day.  When on the DASH eating plan, aim to eat more fresh fruits and vegetables, whole grains, lean proteins, low-fat dairy, and heart-healthy fats.  Work with your  health care provider or diet and nutrition specialist (dietitian) to adjust your eating plan to your individual calorie needs. This information is not intended to replace advice given to you by your health care provider. Make sure you discuss any questions you have with your health care provider. Document Released: 10/12/2011 Document Revised: 10/05/2017 Document Reviewed: 10/16/2016 Elsevier Patient Education  2020 [employer].

## 2019-05-27 ENCOUNTER — Telehealth: Payer: Self-pay

## 2019-05-27 NOTE — Telephone Encounter (Signed)
Can bring paperwork to office for [place] to complete.

## 2019-05-27 NOTE — Telephone Encounter (Signed)
Contacted patient again to let patient know he could bring paper work to office and drop off. Also informed him there would be a fee for filling out paperwork but he did not require an appointment. Patient verbalized understanding and was in agreement with bringing paperwork in and dropping off. Stated he will bring in morning before work. Also informed patient we have no samples of Viagra. Patient denied further questions.

## 2019-05-27 NOTE — Telephone Encounter (Signed)
Patient called and stated he needed FMLA paper work filled out in order to keep his job. Patient states 2 -3 weeks ago he went to emergency room for right swollen leg and was taken out of work for 3 days. Then had to go to urgent care for the same problem and was taken out of work for rest of week. Patient states his job is requiring FMLA papers to be filled out in order to keep job.  Patient is also requesting samples of viagra. He states unable to fill medication due to cost. Offered patient an appointment but patient states unable to do an appointment right now due to work.

## 2019-05-29 ENCOUNTER — Telehealth: Payer: Self-pay | Admitting: *Deleted

## 2019-05-29 NOTE — Telephone Encounter (Signed)
Received FMLA Paperwork from [employer] [facility]) at [employer]. Leave Beginning on 05/05/2019 due to Knee pop and pain.  Fax#: [phone] Placed in [place] folder to review, fill out and sign.

## 2019-06-04 DIAGNOSIS — Z029 Encounter for administrative examinations, unspecified: Secondary | ICD-10-CM

## 2019-06-04 NOTE — Telephone Encounter (Signed)
Paperwork complete and at the front ready for pickup, pt has been notified

## 2019-06-04 NOTE — Telephone Encounter (Signed)
Patient is calling wanting to know the status of his paperwork. Stated that he needs it.  Please Advise.

## 2019-06-05 ENCOUNTER — Encounter: Payer: Self-pay | Admitting: Nurse Practitioner

## 2019-06-05 ENCOUNTER — Telehealth: Payer: Self-pay | Admitting: Nurse Practitioner

## 2019-06-05 NOTE — Telephone Encounter (Signed)
Completed and has been faxed back, he is aware and plans to pick up form at the front

## 2019-06-05 NOTE — Telephone Encounter (Signed)
Patient wanted to let Jonathan Taylor know that his boss states that the only way he can work tonight is that letter/form has to state that he has no limitations or restrictions.  He states that he needs this by 5pm today.

## 2019-06-05 NOTE — Telephone Encounter (Signed)
Pt requesting physical requirement form completed without limitation. He is planning on seeing orthopedic specialist on 06/10/2019 and pt states he is aware that continuing to do heavy lifting as his job is requiring will continue to provide stress to knee making knee pain and swelling worse. He is planning to discuss all this with this orthopedic and if they feel the need they will take him out of work.

## 2019-06-09 ENCOUNTER — Telehealth: Payer: Self-pay | Admitting: *Deleted

## 2019-06-09 MED ORDER — SILDENAFIL CITRATE 100 MG PO TABS: 100.0000 mg | ORAL_TABLET | Freq: Every day | ORAL | 0 refills | Status: DC | PRN

## 2019-06-09 NOTE — Telephone Encounter (Signed)
Patient called and stated that Jonathan Taylor called him in some Viagra but it costs $500. Patient cannot afford this. Patient is wanting the GENERIC of Viagra called in instead. Please Advise.

## 2019-06-09 NOTE — Telephone Encounter (Signed)
rx of generic sent

## 2019-06-09 NOTE — Telephone Encounter (Signed)
Patient notified and agreed.  

## 2019-06-11 ENCOUNTER — Other Ambulatory Visit: Payer: Self-pay

## 2019-06-27 ENCOUNTER — Encounter: Payer: Self-pay | Admitting: Nurse Practitioner

## 2019-06-27 ENCOUNTER — Ambulatory Visit (INDEPENDENT_AMBULATORY_CARE_PROVIDER_SITE_OTHER): Payer: Managed Care, Other (non HMO) | Admitting: Nurse Practitioner

## 2019-06-27 ENCOUNTER — Other Ambulatory Visit: Payer: Self-pay

## 2019-06-27 VITALS — BP 144/92 | HR 85 | Temp 98.7°F | Ht 78.0 in | Wt 315.0 lb

## 2019-06-27 DIAGNOSIS — E785 Hyperlipidemia, unspecified: Secondary | ICD-10-CM | POA: Diagnosis not present

## 2019-06-27 DIAGNOSIS — E669 Obesity, unspecified: Secondary | ICD-10-CM

## 2019-06-27 DIAGNOSIS — E1169 Type 2 diabetes mellitus with other specified complication: Secondary | ICD-10-CM | POA: Diagnosis not present

## 2019-06-27 DIAGNOSIS — I10 Essential (primary) hypertension: Secondary | ICD-10-CM

## 2019-06-27 DIAGNOSIS — Z7251 High risk heterosexual behavior: Secondary | ICD-10-CM

## 2019-06-27 NOTE — Patient Instructions (Signed)

## 2019-06-27 NOTE — Progress Notes (Signed)
  Careteam: Patient Care Team: Eubanks, Jessica K, NP as PCP - General (Geriatric Medicine) Byers, John, MD as Consulting Physician (Otolaryngology)  Advanced Directive information    Allergies  Allergen Reactions  . Mushroom Extract Complex Anaphylaxis and Swelling    Lips  . Shrimp [Shellfish Allergy] Anaphylaxis  . Tomato Anaphylaxis  . Tylenol [Acetaminophen] Swelling    Chief Complaint  Patient presents with  . Follow-up    6 week follow-upm only on B/P & cholesterol medication for 2 days      HPI: Patient is a 33 y.o. male seen in the office today for follow up Just got insurance and started medication recently.  Not taking blood pressure at home.  Smoker- smoked a cigarette before he came to appt.   Hyperlipidemia- he was on medication for some time but then ran out.   htn- stress at home with wife. He ha  Had intercourse with someone other than wife- she request STD testing as well as counseling.   Morbid obesity- continues to work on diet modifications, not doing much exercise due to knee Has limited calories and continue to be moving.   Right knee pain- plan to do surgery for severe OA-- not going to do a total knee due to age; he is currently out of work due to this.     Review of Systems:  Review of Systems  Constitutional: Negative for chills, fever and weight loss.  HENT: Negative for tinnitus.   Respiratory: Negative for cough, sputum production and shortness of breath.   Cardiovascular: Negative for chest pain, palpitations and leg swelling.  Gastrointestinal: Negative for abdominal pain, constipation, diarrhea and heartburn.  Genitourinary: Negative for dysuria, frequency and urgency.  Musculoskeletal: Positive for joint pain (bilateral knee pain, worse on right). Negative for back pain, falls and myalgias.  Skin: Negative.   Neurological: Negative for dizziness and headaches.  Psychiatric/Behavioral: Negative for depression and memory loss.  The patient is not nervous/anxious and does not have insomnia.        Stress at home.    Past Medical History:  Diagnosis Date  . Blood in stool   . Diabetes mellitus without complication (HCC)   . GERD (gastroesophageal reflux disease)   . History of anal fissures   . Low sperm motility    Past Surgical History:  Procedure Laterality Date  . ANAL FISTULECTOMY  2012   with sphhincterotomy  . ANAL FISTULOTOMY N/A 06/29/2015   Procedure: EXCISION PERI RECTAL SCAR/FISTULA, INTERNAL HEMMORRHOIDAL LIGATION, PEXY, MARSUPIALIZATION;  Surgeon: Steven Gross, MD;  Location: [facility];  Service: General;  Laterality: N/A;  . EXAMINATION UNDER ANESTHESIA N/A 06/29/2015   Procedure: EXAM UNDER ANESTHESIA;  Surgeon: Steven Gross, MD;  Location: [facility];  Service: General;  Laterality: N/A;  . KNEE SURGERY  07/07/2018  . RECTAL SURGERY  08/2010   Dr Allen   Social History:   reports that he has been smoking cigarettes. He has a 1.50 pack-year smoking history. He has never used smokeless tobacco. He reports current alcohol use. He reports that he does not use drugs.  Family History  Problem Relation Age of Onset  . Diabetes Mother   . Hypertension Mother   . Throat cancer Maternal Aunt   . Cirrhosis Maternal Grandfather   . Colon cancer Neg Hx   . Colon polyps Neg Hx   . Kidney disease Neg Hx     Medications: Patient's Medications  New Prescriptions   No medications on file    Previous Medications   ATORVASTATIN (LIPITOR) 20 MG TABLET    Take 1 tablet (20 mg total) by mouth daily.   LOSARTAN (COZAAR) 25 MG TABLET    Take 1 tablet (25 mg total) by mouth daily.   SILDENAFIL (VIAGRA) 100 MG TABLET    Take 1 tablet (100 mg total) by mouth daily as needed for erectile dysfunction.  Modified Medications   No medications on file  Discontinued Medications   METHYLPREDNISOLONE (MEDROL DOSEPAK) 4 MG TBPK TABLET    tad    Physical Exam:  Vitals:   06/27/19 1310  BP: (!) 144/92  Pulse: 85   Temp: 98.7 F (37.1 C)  TempSrc: Oral  SpO2: 98%  Weight: (!) 315 lb (142.9 kg)  Height: 6' 6" (1.981 m)   Body mass index is 36.4 kg/m. Wt Readings from Last 3 Encounters:  06/27/19 (!) 315 lb (142.9 kg)  05/16/19 (!) 326 lb (147.9 kg)  05/05/19 300 lb (136.1 kg)    Physical Exam Constitutional:      Appearance: Normal appearance.  HENT:     Head: Normocephalic and atraumatic.  Neck:     Musculoskeletal: Normal range of motion.  Cardiovascular:     Rate and Rhythm: Normal rate and regular rhythm.     Pulses: Normal pulses.  Pulmonary:     Effort: Pulmonary effort is normal.     Breath sounds: Normal breath sounds.  Musculoskeletal:        General: Deformity (to right knee) present.  Skin:    General: Skin is warm and dry.  Neurological:     General: No focal deficit present.     Mental Status: He is alert and oriented to person, place, and time.     Gait: Gait abnormal (due to right knee).  Psychiatric:        Mood and Affect: Mood normal.     Labs reviewed: Basic Metabolic Panel: Recent Labs    07/29/18 1108 09/18/18 0841 01/21/19 1027  NA 138 139 140  K 3.8 3.8 4.1  CL 105 107 105  CO2 _0 GLUCOSE 195* 151* 139*  BUN _1 CREATININE 1.01 1.00 1.00  CALCIUM 9.7 9.2 9.4   Liver Function Tests: Recent Labs    07/29/18 1108 01/21/19 1027  AST 19 29  ALT 23 31  ALKPHOS 70  --   BILITOT 0.5 0.4  PROT 7.3 6.5  ALBUMIN 4.0  --    No results for input(s): LIPASE, AMYLASE in the last 8760 hours. No results for input(s): AMMONIA in the last 8760 hours. CBC: Recent Labs    01/21/19 1027  WBC 6.0  NEUTROABS 3,456  HGB 13.9  HCT 41.5  MCV 78.0*  PLT 257   Lipid Panel: Recent Labs    07/29/18 1108 01/21/19 1027  CHOL 150 146  HDL 39.30 46  LDLCALC 101* 86  TRIG 48.0 48  CHOLHDL 4 3.2   TSH: No results for input(s): TSH in the last 8760 hours. A1C: Lab Results  Component Value Date   HGBA1C 7.5 (H) 01/21/2019      Assessment/Plan 1. Hyperlipidemia, unspecified hyperlipidemia type -has recently restarted lipitor 20 mg daily, to continue medication with dietary compliance.  - CMP with eGFR([facility])  2. Diabetes mellitus type 2 in obese (HCC) - Hemoglobin A1c follow up today. Continue on dietary modifications.  3. Essential hypertension -elevated today due to stress at home. States his blood pressure was in the 120/? At orthopedic -  continues on losartan with dietary modifications. - CBC with Differential/Platelet  4. Morbid obesity ([place]) -continues to lose weight through dietary modifications   5. High risk heterosexual behavior -recent affair requesting to be tested, education provided about high risk behavior. - RPR - HIV Antibody (routine testing w rflx) - C. trachomatis/N. gonorrhoeae RNA - Hepatitis C antibody  Next appt: 3 months for routine follow up [place]. [place], [facility] [phone]

## 2019-06-30 ENCOUNTER — Other Ambulatory Visit: Payer: Self-pay

## 2019-06-30 LAB — CBC WITH DIFFERENTIAL/PLATELET
Absolute Monocytes: 578 cells/uL (ref 200–950)
Basophils Absolute: 23 cells/uL (ref 0–200)
Basophils Relative: 0.3 %
Eosinophils Absolute: 53 cells/uL (ref 15–500)
Eosinophils Relative: 0.7 %
HCT: 43.3 % (ref 38.5–50.0)
Hemoglobin: 14.5 g/dL (ref 13.2–17.1)
Lymphs Abs: 2378 cells/uL (ref 850–3900)
MCH: 26.4 pg — ABNORMAL LOW (ref 27.0–33.0)
MCHC: 33.5 g/dL (ref 32.0–36.0)
MCV: 78.9 fL — ABNORMAL LOW (ref 80.0–100.0)
MPV: 11.7 fL (ref 7.5–12.5)
Monocytes Relative: 7.7 %
Neutro Abs: 4470 cells/uL (ref 1500–7800)
Neutrophils Relative %: 59.6 %
Platelets: 237 10*3/uL (ref 140–400)
RBC: 5.49 10*6/uL (ref 4.20–5.80)
RDW: 13.1 % (ref 11.0–15.0)
Total Lymphocyte: 31.7 %
WBC: 7.5 10*3/uL (ref 3.8–10.8)

## 2019-06-30 LAB — COMPLETE METABOLIC PANEL WITH GFR
AG Ratio: 1.6 (calc) (ref 1.0–2.5)
ALT: 22 U/L (ref 9–46)
AST: 19 U/L (ref 10–40)
Albumin: 4 g/dL (ref 3.6–5.1)
Alkaline phosphatase (APISO): 81 U/L (ref 36–130)
BUN: 12 mg/dL (ref 7–25)
CO2: 26 mmol/L (ref 20–32)
Calcium: 9.7 mg/dL (ref 8.6–10.3)
Chloride: 104 mmol/L (ref 98–110)
Creat: 1.05 mg/dL (ref 0.60–1.35)
GFR, Est African American: 108 mL/min/{1.73_m2} (ref >=60)
GFR, Est Non African American: 93 mL/min/{1.73_m2} (ref >=60)
Globulin: 2.5 g/dL (calc) (ref 1.9–3.7)
Glucose, Bld: 131 mg/dL (ref 65–139)
Potassium: 4 mmol/L (ref 3.5–5.3)
Sodium: 140 mmol/L (ref 135–146)
Total Bilirubin: 0.4 mg/dL (ref 0.2–1.2)
Total Protein: 6.5 g/dL (ref 6.1–8.1)

## 2019-06-30 LAB — HEMOGLOBIN A1C
Hgb A1c MFr Bld: 7.1 % of total Hgb — ABNORMAL HIGH (ref <5.7)
Mean Plasma Glucose: 157 (calc)
eAG (mmol/L): 8.7 (calc)

## 2019-06-30 LAB — HEPATITIS C ANTIBODY
Hepatitis C Ab: NONREACTIVE
SIGNAL TO CUT-OFF: 0.15 (ref <1.00)

## 2019-06-30 LAB — RPR: RPR Ser Ql: NONREACTIVE

## 2019-06-30 LAB — HIV ANTIBODY (ROUTINE TESTING W REFLEX): HIV 1&2 Ab, 4th Generation: NONREACTIVE

## 2019-06-30 LAB — C. TRACHOMATIS/N. GONORRHOEAE RNA
C. trachomatis RNA, TMA: NOT DETECTED
N. gonorrhoeae RNA, TMA: NOT DETECTED

## 2019-06-30 MED ORDER — FARXIGA 5 MG PO TABS: 5.0000 mg | ORAL_TABLET | Freq: Every day | ORAL | 1 refills | Status: DC

## 2019-07-29 ENCOUNTER — Other Ambulatory Visit: Payer: Self-pay | Admitting: Podiatry

## 2019-07-29 ENCOUNTER — Telehealth: Payer: Self-pay | Admitting: *Deleted

## 2019-07-29 NOTE — Telephone Encounter (Signed)
Patient called requesting refill on his Sildenafil. Patient is requesting more than #10. Stated he would like a "full" Rx. Please Advise.   (Sent to [place] due to Janett Billow out of office)

## 2019-07-30 MED ORDER — SILDENAFIL CITRATE 100 MG PO TABS: 100.0000 mg | ORAL_TABLET | Freq: Every day | ORAL | 0 refills | Status: DC | PRN

## 2019-07-30 NOTE — Telephone Encounter (Signed)
Patient requested refill.  Stated that he only wanted #10

## 2019-07-30 NOTE — Telephone Encounter (Signed)
Check with insurance if full prescription is covered.Recommended 10 tablet as needed.

## 2019-08-08 HISTORY — PX: KNEE ARTHROSCOPY W/ MENISCAL REPAIR: SHX1877

## 2019-09-02 ENCOUNTER — Encounter: Payer: Self-pay | Admitting: Family

## 2019-09-02 ENCOUNTER — Other Ambulatory Visit: Payer: Self-pay

## 2019-09-02 ENCOUNTER — Ambulatory Visit (INDEPENDENT_AMBULATORY_CARE_PROVIDER_SITE_OTHER): Payer: Managed Care, Other (non HMO) | Admitting: Family

## 2019-09-02 DIAGNOSIS — Z96651 Presence of right artificial knee joint: Secondary | ICD-10-CM | POA: Diagnosis not present

## 2019-09-02 DIAGNOSIS — R2681 Unsteadiness on feet: Secondary | ICD-10-CM

## 2019-09-02 DIAGNOSIS — N529 Male erectile dysfunction, unspecified: Secondary | ICD-10-CM | POA: Diagnosis not present

## 2019-09-02 DIAGNOSIS — M25561 Pain in right knee: Secondary | ICD-10-CM

## 2019-09-02 DIAGNOSIS — Z87438 Personal history of other diseases of male genital organs: Secondary | ICD-10-CM

## 2019-09-02 MED ORDER — SILDENAFIL CITRATE 100 MG PO TABS: 100.0000 mg | ORAL_TABLET | Freq: Every day | ORAL | 0 refills | Status: DC | PRN

## 2019-09-02 NOTE — Progress Notes (Signed)
This service is provided via telemedicine  No vital signs collected/recorded due to the encounter was a telemedicine visit.   Location of patient (ex: home, work):  Home   Patient consents to a telephone visit:  Yes  Location of the provider (ex: office, home):  Office   Name of any referring provider:  Sherrie Mustache, NP   Names of all persons participating in the telemedicine service and their role in the encounter:  Marlowe Sax, NP, Ruthell Rummage CMA, and Franki Monte   Time spent on call:  Ruthell Rummage CMA spent 8 minutes on phone with patient.    Provider: Raquel Sayres FNP-C  [name], NP  Patient Care Team: [name], NP as PCP - General (Geriatric Medicine) Melissa Montane, MD as Consulting Physician (Otolaryngology)  Extended Emergency Contact Information Primary Emergency Contact: Bucklin Address: [place], [place] [address] [name] of [place] Phone: [phone] Work Phone: [phone] Relation: Spouse Secondary Emergency Contact: Oberhaus,Catherine Address: [place], [place] [address] [place] Phone: [phone] Relation: Grandmother Mother: [name] [place] of [place] Phone: [phone]  Code Status: Full Code  Goals of care: Advanced Directive information Advanced Directives 09/04/2018  Does Patient Have a Medical Advance Directive? No  Would patient like information on creating a medical advance directive? -     Chief Complaint  Patient presents with  . Acute Visit    Patient would like semen analysis done at [facility]   . Form Completion    Handicap place card due to knee injury     HPI:  Pt is a 33 y.o. male seen today at for an acute visit to discuss refer to Urology.Spoke with patient and wife on the phone.He states would like referral to [facility] Urologist for semen analysis.wife states patient had semen  analysis in the past at [facility] results showed issues with motility.Wife states has completed testing which showed no problems with her fallopian tubes.They are trying to get a baby.she states insurance indicates coverage only with [facility] health urology.He states contacted urologist but was told to get referral from PCP.He request referral send to Telephone #  [phone]   He would also like a handicap place card due to right knee/leg pain status post  Right knee arthroscopy with chondroplasty and DFO osteotomy with Dr.Waterman Dayna Barker at [facility] 08/08/2019.He states has a post op follow up 08/28/2019 and worked with Physical therapy 09/01/2019.He states not able to  bearing weight on right leg and has weakness on the leg.He uses crutches.     Past Medical History:  Diagnosis Date  . Blood in stool   . Diabetes mellitus without complication ([place])   . GERD (gastroesophageal reflux disease)   . History of anal fissures   . Low sperm motility    Past Surgical History:  Procedure Laterality Date  . ANAL FISTULECTOMY  2012   with sphhincterotomy  . ANAL FISTULOTOMY N/A 06/29/2015   Procedure: EXCISION PERI RECTAL SCAR/FISTULA, INTERNAL HEMMORRHOIDAL LIGATION, PEXY, MARSUPIALIZATION;  Surgeon: Michael Boston, MD;  Location: [facility];  Service: General;  Laterality: N/A;  . EXAMINATION UNDER ANESTHESIA N/A 06/29/2015   Procedure: EXAM UNDER ANESTHESIA;  Surgeon: Michael Boston, MD;  Location: [facility];  Service: General;  Laterality: N/A;  . KNEE SURGERY  07/07/2018  . RECTAL SURGERY  08/2010  Dr Zenia Resides    Allergies  Allergen Reactions  . Mushroom Extract Complex Anaphylaxis and Swelling    Lips  . Shrimp [Shellfish Allergy] Anaphylaxis  . Tomato Anaphylaxis  . Tylenol [Acetaminophen] Swelling    Outpatient Encounter Medications as of 09/02/2019  Medication Sig  . atorvastatin (LIPITOR) 20 MG tablet Take 1 tablet (20 mg total) by mouth daily.  . cyclobenzaprine (FEXMID) 7.5 MG tablet  Take 7.5 mg by mouth 3 (three) times daily as needed.  . dapagliflozin propanediol (FARXIGA) 5 MG TABS tablet Take 5 mg by mouth daily.  Marland Kitchen HYDROcodone-acetaminophen (NORCO) 10-325 MG tablet Take 1 tablet by mouth every 8 (eight) hours as needed.  Marland Kitchen ibuprofen (ADVIL) 800 MG tablet Take 1 tablet by mouth every 8 (eight) hours as needed.  Marland Kitchen losartan (COZAAR) 25 MG tablet Take 1 tablet (25 mg total) by mouth daily.  . sildenafil (VIAGRA) 100 MG tablet Take 1 tablet (100 mg total) by mouth daily as needed for erectile dysfunction.  . [DISCONTINUED] albuterol (PROVENTIL HFA;VENTOLIN HFA) 108 (90 Base) MCG/ACT inhaler Inhale 1-2 puffs into the lungs every 6 (six) hours as needed for wheezing or shortness of breath.  . [DISCONTINUED] HYDROcodone-acetaminophen (NORCO) 10-325 MG tablet Take 1 tablet by mouth every 6 (six) hours as needed for moderate pain.   No facility-administered encounter medications on file as of 09/02/2019.     Review of Systems  Constitutional: Negative for appetite change, chills, fatigue and fever.  HENT: Negative for congestion, rhinorrhea, sinus pressure, sinus pain, sneezing and sore throat.   Respiratory: Negative for chest tightness, shortness of breath and wheezing.   Cardiovascular: Negative for chest pain, palpitations and leg swelling.  Gastrointestinal: Negative for abdominal distention, abdominal pain, constipation, diarrhea, nausea and vomiting.  Genitourinary: Negative for decreased urine volume, dysuria and frequency.       Request referral to [facility] urology for semen analysis  Musculoskeletal: Positive for gait problem.       Status post right knee/leg surgery   Skin: Negative for color change, pallor and rash.  Neurological: Negative for dizziness, light-headedness and headaches.  Psychiatric/Behavioral: Negative for agitation and sleep disturbance. The patient is not nervous/anxious.     Immunization History  Administered Date(s) Administered  .  Influenza,inj,Quad PF,6+ Mos 11/16/2014  . Pneumococcal Polysaccharide-23 09/12/2016  . Tdap 11/16/2014   Pertinent  Health Maintenance Due  Topic Date Due  . OPHTHALMOLOGY EXAM  12/31/2018  . INFLUENZA VACCINE  06/07/2019  . FOOT EXAM  07/30/2019  . HEMOGLOBIN A1C  12/28/2019   Fall Risk  05/16/2019 01/21/2019 09/04/2018 02/01/2018 08/21/2017  Falls in the past year? 0 1 Yes Yes No  Number falls in past yr: 0 1 2 or more 1 -  Comment - - Left leg gave out after knee surgery on 07/15/18 - -  Injury with Fall? 0 0 No No -  Risk for fall due to : - History of fall(s);Impaired mobility - - -   There were no vitals filed for this visit. There is no height or weight on file to calculate BMI. Physical Exam  Unable to complete on Telephone visit.   Labs reviewed: Recent Labs    09/18/18 0841 01/21/19 1027 06/27/19 1352  NA 139 140 140  K 3.8 4.1 4.0  CL 107 105 104  CO2 25 26 26   GLUCOSE 151* 139* 131  BUN 13 10 12   CREATININE 1.00 1.00 1.05  CALCIUM 9.2 9.4 9.7   Recent Labs  01/21/19 1027 06/27/19 1352  AST 29 19  ALT 31 22  BILITOT 0.4 0.4  PROT 6.5 6.5   Recent Labs    01/21/19 1027 06/27/19 1352  WBC 6.0 7.5  NEUTROABS 3,456 4,470  HGB 13.9 14.5  HCT 41.5 43.3  MCV 78.0* 78.9*  PLT 257 237   Lab Results  Component Value Date   TSH 1.54 08/21/2017   Lab Results  Component Value Date   HGBA1C 7.1 (H) 06/27/2019   Lab Results  Component Value Date   CHOL 146 01/21/2019   HDL 46 01/21/2019   LDLCALC 86 01/21/2019   TRIG 48 01/21/2019   CHOLHDL 3.2 01/21/2019    Significant Diagnostic Results in last 30 days:  No results found.  Assessment/Plan 1. Erectile dysfunction, unspecified erectile dysfunction type Request referral to [facility] healthy urology for semen analysis.trying to have a baby with wife.Has had previous issues with sperm motility.Has seen [facility] but insurance covers [facility]. - sildenafil (VIAGRA) 100 MG tablet; Take 1 tablet  (100 mg total) by mouth daily as needed for erectile dysfunction.  Dispense: 10 tablet; Refill: 0 - Ambulatory referral to Urology for semen Analysis.   2. Unsteady gait S/p right knee Arthroplasty 08/08/2019. Request disability place card form to be filled.Wife will pick form from [facility] office.form filled out.   3. Right knee pain, unspecified chronicity Pain under control on current regimen.  4. Status post total right knee replacement Reports no signs of infections on incision site.continue with Physical Therapy.continue to follow up with Orthopedic as directed.   Family/ staff Communication: Reviewed plan of care with patient and wife.   Labs/tests ordered: None   Spent 11 minutes of non-face to face with patient    Sandrea Hughs, NP

## 2019-09-03 ENCOUNTER — Telehealth: Payer: Self-pay

## 2019-09-03 DIAGNOSIS — R869 Unspecified abnormal finding in specimens from male genital organs: Secondary | ICD-10-CM

## 2019-09-03 NOTE — Telephone Encounter (Signed)
Jonathan Taylor, Can you take care of this or does a new referral need to be completed?  If this is something I could take care of please let me know.   Thank you.

## 2019-09-03 NOTE — Telephone Encounter (Signed)
Patient was seen on 09/02/19.  He asked for referral to [facility] for "Semen Analysis." He stated the referral showed "erectile dysfunction" as the reason for referral and the paperwork needs to be redone.   He stated this was the number that needed to be called for the referral [phone].  Told patient that usually referral was sent electronically.

## 2019-09-03 NOTE — Telephone Encounter (Signed)
Add history of abnormal semen to referral diagnosis.

## 2019-09-05 DIAGNOSIS — R869 Unspecified abnormal finding in specimens from male genital organs: Secondary | ICD-10-CM

## 2019-09-05 NOTE — Telephone Encounter (Signed)
This encounter was created in error - please disregard.

## 2019-09-05 NOTE — Telephone Encounter (Signed)
Entered referral for semen analysis.  Notified patient new referral had been sent.

## 2019-09-05 NOTE — Progress Notes (Signed)
This encounter was created in error - please disregard.

## 2019-09-15 ENCOUNTER — Other Ambulatory Visit: Payer: Self-pay | Admitting: Nurse Practitioner

## 2019-09-15 DIAGNOSIS — I10 Essential (primary) hypertension: Secondary | ICD-10-CM

## 2019-09-29 ENCOUNTER — Ambulatory Visit (INDEPENDENT_AMBULATORY_CARE_PROVIDER_SITE_OTHER): Payer: Managed Care, Other (non HMO) | Admitting: Nurse Practitioner

## 2019-09-29 ENCOUNTER — Other Ambulatory Visit: Payer: Self-pay

## 2019-09-29 ENCOUNTER — Encounter: Payer: Self-pay | Admitting: Nurse Practitioner

## 2019-09-29 ENCOUNTER — Telehealth: Payer: Self-pay | Admitting: Nurse Practitioner

## 2019-09-29 VITALS — BP 160/100 | HR 93 | Temp 97.7°F | Ht 78.0 in | Wt 316.0 lb

## 2019-09-29 DIAGNOSIS — E669 Obesity, unspecified: Secondary | ICD-10-CM

## 2019-09-29 DIAGNOSIS — E785 Hyperlipidemia, unspecified: Secondary | ICD-10-CM

## 2019-09-29 DIAGNOSIS — I10 Essential (primary) hypertension: Secondary | ICD-10-CM

## 2019-09-29 DIAGNOSIS — M25561 Pain in right knee: Secondary | ICD-10-CM

## 2019-09-29 DIAGNOSIS — F17200 Nicotine dependence, unspecified, uncomplicated: Secondary | ICD-10-CM

## 2019-09-29 DIAGNOSIS — E1169 Type 2 diabetes mellitus with other specified complication: Secondary | ICD-10-CM | POA: Diagnosis not present

## 2019-09-29 DIAGNOSIS — E119 Type 2 diabetes mellitus without complications: Secondary | ICD-10-CM

## 2019-09-29 DIAGNOSIS — F439 Reaction to severe stress, unspecified: Secondary | ICD-10-CM

## 2019-09-29 MED ORDER — LOSARTAN POTASSIUM 50 MG PO TABS: 50.0000 mg | ORAL_TABLET | Freq: Every day | ORAL | 1 refills | Status: DC

## 2019-09-29 MED ORDER — FARXIGA 5 MG PO TABS: 5.0000 mg | ORAL_TABLET | Freq: Every day | ORAL | 1 refills | Status: DC

## 2019-09-29 NOTE — Telephone Encounter (Signed)
Patient left without making a 4 week appointment.  Patient stated that he would call us back to schedule this appointment.

## 2019-09-29 NOTE — Progress Notes (Signed)
Careteam: Patient Care Team: [name], NP as PCP - General (Geriatric Medicine) Melissa Montane, MD as Consulting Physician (Otolaryngology)  Advanced Directive information    Allergies  Allergen Reactions  . Mushroom Extract Complex Anaphylaxis and Swelling    Lips  . Shrimp [Shellfish Allergy] Anaphylaxis  . Tomato Anaphylaxis  . Tylenol [Acetaminophen] Swelling    Chief Complaint  Patient presents with  . Medical Management of Chronic Issues    3 month follow-up      HPI: Patient is a 33 y.o. male seen in the office today for routine follow up. Pt is status post  Right knee arthroscopy with chondroplasty and DFO osteotomy with Dr.Waterman Dayna Barker at [facility] 08/08/2019. He is currently in PT now. Reports she is somewhat in a lot of pain. Only gave him 22 tablets for pain.   Increase stressors.   Reports he is taking ibuprofen and muscle relaxer for pain but it does not last that long  Frustrated because he has not made the progress that he wants.   Hypertension- not at goal. Took medication this morning Reports he does not take blood pressure at home- "does not have a way"  Hyperlipidemia- continues on Lipitor.   Dm- no longer taking farxiga, stopped prior to surgery but then after surgery never restarted.  Does not check blood sugar as well.   Increase stress- dealing with his mom. Answering to a lot of people about "where he is going, what he is doing" stresses him out. Increase in anxiety but "will have to talk about this another day"  Review of Systems:  Review of Systems  Constitutional: Negative for chills, fever and weight loss.  HENT: Negative for hearing loss.   Respiratory: Negative for cough, sputum production and shortness of breath.   Cardiovascular: Negative for chest pain and leg swelling.  Gastrointestinal: Negative for abdominal pain, constipation, diarrhea and heartburn.  Genitourinary: Negative for frequency.  Musculoskeletal:  Positive for joint pain and myalgias.  Skin: Negative for itching and rash.  Neurological: Negative for dizziness and headaches.  Psychiatric/Behavioral: The patient is nervous/anxious.     Past Medical History:  Diagnosis Date  . Blood in stool   . Diabetes mellitus without complication ([place])   . GERD (gastroesophageal reflux disease)   . History of anal fissures   . Low sperm motility    Past Surgical History:  Procedure Laterality Date  . ANAL FISTULECTOMY  2012   with sphhincterotomy  . ANAL FISTULOTOMY N/A 06/29/2015   Procedure: EXCISION PERI RECTAL SCAR/FISTULA, INTERNAL HEMMORRHOIDAL LIGATION, PEXY, MARSUPIALIZATION;  Surgeon: Michael Boston, MD;  Location: [facility];  Service: General;  Laterality: N/A;  . EXAMINATION UNDER ANESTHESIA N/A 06/29/2015   Procedure: EXAM UNDER ANESTHESIA;  Surgeon: Michael Boston, MD;  Location: [facility];  Service: General;  Laterality: N/A;  . KNEE SURGERY  07/07/2018  . LEG SURGERY  10/2/20202  . RECTAL SURGERY  08/2010   Dr Zenia Resides   Social History:   reports that he has been smoking cigarettes. He has a 1.50 pack-year smoking history. He has never used smokeless tobacco. He reports current alcohol use. He reports that he does not use drugs.  Family History  Problem Relation Age of Onset  . Diabetes Mother   . Hypertension Mother   . Throat cancer Maternal Aunt   . Cirrhosis Maternal Grandfather   . Colon cancer Neg Hx   . Colon polyps Neg Hx   . Kidney disease Neg Hx  Medications: Patient's Medications  New Prescriptions   No medications on file  Previous Medications   ATORVASTATIN (LIPITOR) 20 MG TABLET    Take 1 tablet (20 mg total) by mouth daily.   CYCLOBENZAPRINE (FEXMID) 7.5 MG TABLET    Take 7.5 mg by mouth 3 (three) times daily as needed.   DAPAGLIFLOZIN PROPANEDIOL (FARXIGA) 5 MG TABS TABLET    Take 5 mg by mouth daily.   LOSARTAN (COZAAR) 25 MG TABLET    Take 1 tablet by mouth once daily   SILDENAFIL (VIAGRA) 100 MG TABLET     Take 1 tablet (100 mg total) by mouth daily as needed for erectile dysfunction.  Modified Medications   No medications on file  Discontinued Medications   No medications on file    Physical Exam:  Vitals:   09/29/19 1309  BP: (!) 160/100  Pulse: 93  Temp: 97.7 F (36.5 C)  TempSrc: Oral  SpO2: 98%  Weight: (!) 316 lb (143.3 kg)  Height: '6\' 6"'$  (1.981 m)   Body mass index is 36.52 kg/m. Wt Readings from Last 3 Encounters:  09/29/19 (!) 316 lb (143.3 kg)  06/27/19 (!) 315 lb (142.9 kg)  05/16/19 (!) 326 lb (147.9 kg)    Physical Exam Constitutional:      Appearance: Normal appearance.     Comments: High energy today  HENT:     Head: Normocephalic and atraumatic.  Cardiovascular:     Rate and Rhythm: Normal rate and regular rhythm.     Pulses: Normal pulses.  Pulmonary:     Effort: Pulmonary effort is normal.     Breath sounds: Normal breath sounds.  Abdominal:     General: Abdomen is protuberant. Bowel sounds are normal.  Musculoskeletal:     Comments: Right knee brace  Skin:    General: Skin is warm and dry.  Neurological:     Mental Status: He is alert.  Psychiatric:        Mood and Affect: Mood is anxious.        Behavior: Behavior is agitated.     Labs reviewed: Basic Metabolic Panel: Recent Labs    01/21/19 1027 06/27/19 1352  NA 140 140  K 4.1 4.0  CL 105 104  CO2 26 26  GLUCOSE 139* 131  BUN 10 12  CREATININE 1.00 1.05  CALCIUM 9.4 9.7   Liver Function Tests: Recent Labs    01/21/19 1027 06/27/19 1352  AST 29 19  ALT 31 22  BILITOT 0.4 0.4  PROT 6.5 6.5   No results for input(s): LIPASE, AMYLASE in the last 8760 hours. No results for input(s): AMMONIA in the last 8760 hours. CBC: Recent Labs    01/21/19 1027 06/27/19 1352  WBC 6.0 7.5  NEUTROABS 3,456 4,470  HGB 13.9 14.5  HCT 41.5 43.3  MCV 78.0* 78.9*  PLT 257 237   Lipid Panel: Recent Labs    01/21/19 1027  CHOL 146  HDL 46  LDLCALC 86  TRIG 48  CHOLHDL 3.2    TSH: No results for input(s): TSH in the last 8760 hours. A1C: Lab Results  Component Value Date   HGBA1C 7.1 (H) 06/27/2019     Assessment/Plan 1. Essential hypertension -elevated today. States he is very stress due to a lot of family issues. States that there is always "something" going on when he comes to office.  Does not check blood pressure at home. Educated him in detail on long term effects of uncontrolled hypertension  increase risk of MI and CVA. Need for dietary modification and medication adjustment. He is hesitant to add or change any medication but agreeable after education. - losartan (COZAAR) 50 MG tablet; Take 1 tablet (50 mg total) by mouth daily.  Dispense: 30 tablet; Refill: 1 - CMP with eGFR([facility])  2. Diabetes mellitus type 2 in obese ([facility]) -does not check blood sugars at home and has not been on medication for several months.  - discussed with the patient the pathophysiology of diabetes and the natural progression of the disease.  -stressed the importance of lifestyle changes including diet and exercise. -discussed complications associated with diabetes including retinopathy, nephropathy, neuropathy as well as increased risk of cardiovascular disease. We went over the benefit seen with glycemic control.  -Encouraged dietary compliance, routine foot care/monitoring and to keep up with diabetic eye exams through ophthalmology  - Hemoglobin A1c - CMP with eGFR([facility]) - dapagliflozin propanediol (FARXIGA) 5 MG TABS tablet; Take 5 mg by mouth daily.  Dispense: 90 tablet; Refill: 1  3. Morbid obesity ([place]) -weight unchanged at this time. Had been making dietary changes but no longer doing this. Encouraged diet modifications   4. Smoker -encouraged cessation, reports minimal smoking at this time.  5. Hyperlipidemia, unspecified hyperlipidemia type -states compliance with medication. Encouraged diet changes as well. - Lipid Panel - CMP with eGFR([facility])  6. Right  knee pain, unspecified chronicity -ongoing follow up with orthopedics due to slow improvement post op with increase pain. Undergoing PT at this time.   7. Stress Reports increase stress at home. encouraged healthy stress management with deep breathing exercises, arm exercises, (due to limited use of legs), sitting outside. Will discuss further at 4 week follow up.  Next appt: 4 weeks for bp check Leeanne Butters K. [place], [facility] [phone]

## 2019-09-29 NOTE — Patient Instructions (Signed)
4 week follow up on blood pressure  Increase losartan to 50 mg daily- can take 2 of the tablets that you have until complete- new Rx sent to pharmacy   [place] stands for "Dietary Approaches to Stop Hypertension." The DASH eating plan is a healthy eating plan that has been shown to reduce high blood pressure (hypertension). It may also reduce your risk for type 2 diabetes, heart disease, and stroke. The DASH eating plan may also help with weight loss. What are tips for following this plan?  General guidelines  Avoid eating more than 2,300 mg (milligrams) of salt (sodium) a day. If you have hypertension, you may need to reduce your sodium intake to 1,500 mg a day.  Limit alcohol intake to no more than 1 drink a day for nonpregnant women and 2 drinks a day for men. One drink equals 12 oz of beer, 5 oz of wine, or 1 oz of hard liquor.  Work with your health care provider to maintain a healthy body weight or to lose weight. Ask what an ideal weight is for you.  Get at least 30 minutes of exercise that causes your heart to beat faster (aerobic exercise) most days of the week. Activities may include walking, swimming, or biking.  Work with your health care provider or diet and nutrition specialist (dietitian) to adjust your eating plan to your individual calorie needs. Reading food labels   Check food labels for the amount of sodium per serving. Choose foods with less than 5 percent of the Daily Value of sodium. Generally, foods with less than 300 mg of sodium per serving fit into this eating plan.  To find whole grains, look for the word "whole" as the first word in the ingredient list. Shopping  Buy products labeled as "low-sodium" or "no salt added."  Buy fresh foods. Avoid canned foods and premade or frozen meals. Cooking  Avoid adding salt when cooking. Use salt-free seasonings or herbs instead of table salt or sea salt. Check with your health care provider or  pharmacist before using salt substitutes.  Do not fry foods. Cook foods using healthy methods such as baking, boiling, grilling, and broiling instead.  Cook with heart-healthy oils, such as olive, canola, soybean, or sunflower oil. Meal planning  Eat a balanced diet that includes: ? 5 or more servings of fruits and vegetables each day. At each meal, try to fill half of your plate with fruits and vegetables. ? Up to 6-8 servings of whole grains each day. ? Less than 6 oz of lean meat, poultry, or fish each day. A 3-oz serving of meat is about the same size as a deck of cards. One egg equals 1 oz. ? 2 servings of low-fat dairy each day. ? A serving of nuts, seeds, or beans 5 times each week. ? Heart-healthy fats. Healthy fats called Omega-3 fatty acids are found in foods such as flaxseeds and coldwater fish, like sardines, salmon, and mackerel.  Limit how much you eat of the following: ? Canned or prepackaged foods. ? Food that is high in trans fat, such as fried foods. ? Food that is high in saturated fat, such as fatty meat. ? Sweets, desserts, sugary drinks, and other foods with added sugar. ? Full-fat dairy products.  Do not salt foods before eating.  Try to eat at least 2 vegetarian meals each week.  Eat more home-cooked food and less restaurant, buffet, and fast food.  When eating at a restaurant,  ask that your food be prepared with less salt or no salt, if possible. What foods are recommended? The items listed may not be a complete list. Talk with your dietitian about what dietary choices are best for you. Grains Whole-grain or whole-wheat bread. Whole-grain or whole-wheat pasta. Brown rice. [place]. Bulgur. Whole-grain and low-sodium cereals. Pita bread. Low-fat, low-sodium crackers. Whole-wheat flour tortillas. Vegetables Fresh or frozen vegetables (raw, steamed, roasted, or grilled). Low-sodium or reduced-sodium tomato and vegetable juice. Low-sodium or  reduced-sodium tomato sauce and tomato paste. Low-sodium or reduced-sodium canned vegetables. Fruits All fresh, dried, or frozen fruit. Canned fruit in natural juice (without added sugar). Meat and other protein foods Skinless chicken or [place]. Ground chicken or [place]. Pork with fat trimmed off. Fish and seafood. Egg whites. Dried beans, peas, or lentils. Unsalted nuts, nut butters, and seeds. Unsalted canned beans. Lean cuts of beef with fat trimmed off. Low-sodium, lean deli meat. Dairy Low-fat (1%) or fat-free (skim) milk. Fat-free, low-fat, or reduced-fat cheeses. Nonfat, low-sodium ricotta or cottage cheese. Low-fat or nonfat yogurt. Low-fat, low-sodium cheese. Fats and oils Soft margarine without trans fats. Vegetable oil. Low-fat, reduced-fat, or light mayonnaise and salad dressings (reduced-sodium). Canola, safflower, olive, soybean, and sunflower oils. Avocado. Seasoning and other foods Herbs. Spices. Seasoning mixes without salt. Unsalted popcorn and pretzels. Fat-free sweets. What foods are not recommended? The items listed may not be a complete list. Talk with your dietitian about what dietary choices are best for you. Grains Baked goods made with fat, such as croissants, muffins, or some breads. Dry pasta or rice meal packs. Vegetables Creamed or fried vegetables. Vegetables in a cheese sauce. Regular canned vegetables (not low-sodium or reduced-sodium). Regular canned tomato sauce and paste (not low-sodium or reduced-sodium). Regular tomato and vegetable juice (not low-sodium or reduced-sodium). Angie Fava. Olives. Fruits Canned fruit in a light or heavy syrup. Fried fruit. Fruit in cream or butter sauce. Meat and other protein foods Fatty cuts of meat. Ribs. Fried meat. Berniece Salines. Sausage. Bologna and other processed lunch meats. Salami. Fatback. Hotdogs. Bratwurst. Salted nuts and seeds. Canned beans with added salt. Canned or smoked fish. Whole eggs or egg yolks. Chicken or [place]  with skin. Dairy Whole or 2% milk, cream, and half-and-half. Whole or full-fat cream cheese. Whole-fat or sweetened yogurt. Full-fat cheese. Nondairy creamers. Whipped toppings. Processed cheese and cheese spreads. Fats and oils Butter. Stick margarine. Lard. Shortening. Ghee. Bacon fat. Tropical oils, such as coconut, palm kernel, or palm oil. Seasoning and other foods Salted popcorn and pretzels. Onion salt, garlic salt, seasoned salt, table salt, and sea salt. Worcestershire sauce. Tartar sauce. Barbecue sauce. Teriyaki sauce. Soy sauce, including reduced-sodium. Steak sauce. Canned and packaged gravies. Fish sauce. Oyster sauce. Cocktail sauce. Horseradish that you find on the shelf. Ketchup. Mustard. Meat flavorings and tenderizers. Bouillon cubes. Hot sauce and Tabasco sauce. Premade or packaged marinades. Premade or packaged taco seasonings. Relishes. Regular salad dressings. Where to find more information:  National Heart, Lung, and [place]: https://wilson-eaton.com/  American Heart Association: www.heart.org Summary  The DASH eating plan is a healthy eating plan that has been shown to reduce high blood pressure (hypertension). It may also reduce your risk for type 2 diabetes, heart disease, and stroke.  With the DASH eating plan, you should limit salt (sodium) intake to 2,300 mg a day. If you have hypertension, you may need to reduce your sodium intake to 1,500 mg a day.  When on the DASH eating plan, aim to eat more fresh  fruits and vegetables, whole grains, lean proteins, low-fat dairy, and heart-healthy fats.  Work with your health care provider or diet and nutrition specialist (dietitian) to adjust your eating plan to your individual calorie needs. This information is not intended to replace advice given to you by your health care provider. Make sure you discuss any questions you have with your health care provider. Document Released: 10/12/2011 Document Revised: 10/05/2017  Document Reviewed: 10/16/2016 Elsevier Patient Education  2020 [employer].

## 2019-09-30 LAB — COMPLETE METABOLIC PANEL WITH GFR
AG Ratio: 1.6 (calc) (ref 1.0–2.5)
ALT: 22 U/L (ref 9–46)
AST: 16 U/L (ref 10–40)
Albumin: 4 g/dL (ref 3.6–5.1)
Alkaline phosphatase (APISO): 128 U/L (ref 36–130)
BUN: 10 mg/dL (ref 7–25)
CO2: 24 mmol/L (ref 20–32)
Calcium: 9.7 mg/dL (ref 8.6–10.3)
Chloride: 106 mmol/L (ref 98–110)
Creat: 0.97 mg/dL (ref 0.60–1.35)
GFR, Est African American: 118 mL/min/{1.73_m2} (ref >=60)
GFR, Est Non African American: 102 mL/min/{1.73_m2} (ref >=60)
Globulin: 2.5 g/dL (calc) (ref 1.9–3.7)
Glucose, Bld: 145 mg/dL — ABNORMAL HIGH (ref 65–99)
Potassium: 3.8 mmol/L (ref 3.5–5.3)
Sodium: 140 mmol/L (ref 135–146)
Total Bilirubin: 0.4 mg/dL (ref 0.2–1.2)
Total Protein: 6.5 g/dL (ref 6.1–8.1)

## 2019-09-30 LAB — LIPID PANEL
Cholesterol: 112 mg/dL (ref <200)
HDL: 45 mg/dL (ref >=40)
LDL Cholesterol (Calc): 55 mg/dL (calc)
Non-HDL Cholesterol (Calc): 67 mg/dL (calc) (ref <130)
Total CHOL/HDL Ratio: 2.5 (calc) (ref <5.0)
Triglycerides: 43 mg/dL (ref <150)

## 2019-09-30 LAB — HEMOGLOBIN A1C
Hgb A1c MFr Bld: 6.3 % of total Hgb — ABNORMAL HIGH (ref <5.7)
Mean Plasma Glucose: 134 (calc)
eAG (mmol/L): 7.4 (calc)

## 2019-12-09 ENCOUNTER — Other Ambulatory Visit: Payer: Self-pay | Admitting: *Deleted

## 2019-12-09 DIAGNOSIS — N529 Male erectile dysfunction, unspecified: Secondary | ICD-10-CM

## 2019-12-09 MED ORDER — SILDENAFIL CITRATE 100 MG PO TABS: 100.0000 mg | ORAL_TABLET | Freq: Every day | ORAL | 0 refills | Status: DC | PRN

## 2019-12-09 NOTE — Telephone Encounter (Signed)
Patient requested refill Faxed to pharmacy.  

## 2020-01-29 DIAGNOSIS — Z029 Encounter for administrative examinations, unspecified: Secondary | ICD-10-CM

## 2020-02-12 ENCOUNTER — Other Ambulatory Visit: Payer: Self-pay

## 2020-02-12 ENCOUNTER — Ambulatory Visit (HOSPITAL_COMMUNITY)
Admission: EM | Admit: 2020-02-12 | Discharge: 2020-02-12 | Disposition: A | Discharge status: Home / Self Care | Payer: Self-pay

## 2020-02-12 ENCOUNTER — Encounter (HOSPITAL_COMMUNITY): Payer: Self-pay

## 2020-02-12 ENCOUNTER — Emergency Department (HOSPITAL_COMMUNITY): Payer: No Typology Code available for payment source

## 2020-02-12 ENCOUNTER — Emergency Department (HOSPITAL_COMMUNITY)
Admission: EM | Admit: 2020-02-12 | Discharge: 2020-02-12 | Disposition: A | Discharge status: Home / Self Care | Payer: No Typology Code available for payment source | Attending: Emergency Medicine | Admitting: Emergency Medicine

## 2020-02-12 ENCOUNTER — Other Ambulatory Visit: Payer: Self-pay | Admitting: Nurse Practitioner

## 2020-02-12 DIAGNOSIS — Z87891 Personal history of nicotine dependence: Secondary | ICD-10-CM | POA: Insufficient documentation

## 2020-02-12 DIAGNOSIS — N529 Male erectile dysfunction, unspecified: Secondary | ICD-10-CM

## 2020-02-12 DIAGNOSIS — Z79899 Other long term (current) drug therapy: Secondary | ICD-10-CM | POA: Insufficient documentation

## 2020-02-12 DIAGNOSIS — M7918 Myalgia, other site: Secondary | ICD-10-CM | POA: Diagnosis present

## 2020-02-12 DIAGNOSIS — I10 Essential (primary) hypertension: Secondary | ICD-10-CM | POA: Insufficient documentation

## 2020-02-12 DIAGNOSIS — Z7982 Long term (current) use of aspirin: Secondary | ICD-10-CM | POA: Diagnosis not present

## 2020-02-12 DIAGNOSIS — E119 Type 2 diabetes mellitus without complications: Secondary | ICD-10-CM | POA: Insufficient documentation

## 2020-02-12 DIAGNOSIS — R0682 Tachypnea, not elsewhere classified: Secondary | ICD-10-CM

## 2020-02-12 MED ORDER — METHOCARBAMOL 500 MG PO TABS: 500.0000 mg | ORAL_TABLET | Freq: Two times a day (BID) | ORAL | 0 refills | Status: DC

## 2020-02-12 MED ORDER — IBUPROFEN 800 MG PO TABS: 800.0000 mg | ORAL_TABLET | Freq: Three times a day (TID) | ORAL | 0 refills | Status: DC
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
## 2020-02-12 NOTE — Discharge Instructions (Addendum)
Take the prescribed ibuprofen as needed for your pain.  Take the muscle relaxer Flexeril as needed for muscle spasm; do not drive, operate machinery, or drink alcohol with this medication as it may make you drowsy.    Follow up with an orthopedist if your pain is not improving.  Go to the emergency department if you have worsening pain or develop new symptoms such as difficulty with urination, weakness, numbness, loss of control of your bladder or bowels, fever, chills or other concerns.

## 2020-02-12 NOTE — ED Notes (Signed)
Patient is being discharged from the [facility] and sent to the Emergency Department via wheelchair by staff. Per Faustino Congress, NP, patient is stable but in need of higher level of care due to a change in his EKG. Patient is aware and verbalizes understanding of plan of care.  Vitals:   02/12/20 1324  BP: (!) 165/103  Pulse: 70  Resp: (!) 23  Temp: 100.1 F (37.8 C)  SpO2: 98%
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
## 2020-02-12 NOTE — ED Notes (Signed)
Patient verbalizes understanding of discharge instructions. Opportunity for questioning and answers were provided. Armband removed by staff, pt discharged from ED ambulatory.   
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
## 2020-02-19 ENCOUNTER — Other Ambulatory Visit: Payer: Self-pay | Admitting: Family

## 2020-02-19 DIAGNOSIS — N529 Male erectile dysfunction, unspecified: Secondary | ICD-10-CM

## 2020-02-19 NOTE — Telephone Encounter (Signed)
Requested medication was approved on 02/13/2020 (6 days ago)

## 2020-03-01 ENCOUNTER — Telehealth: Payer: Self-pay | Admitting: *Deleted

## 2020-03-01 NOTE — Telephone Encounter (Signed)
Patient called questioning his Viagra Rx. Stated that pharmacy only gives him 2-3 tablets at a time. Wants to know why [facility] only sends in that.  Reviewed Chart and [facility] sends in #10 on 4/9. Instructed patient that he would need to call his pharmacy and or insurance company to see why they only give him 2-3 tablets at a time.  Patient agreed.

## 2020-03-05 ENCOUNTER — Other Ambulatory Visit: Payer: Self-pay

## 2020-03-05 ENCOUNTER — Ambulatory Visit: Payer: Managed Care, Other (non HMO) | Attending: Orthopaedic Surgery | Admitting: Physical Therapy

## 2020-03-05 ENCOUNTER — Encounter: Payer: Self-pay | Admitting: Physical Therapy

## 2020-03-05 DIAGNOSIS — G8929 Other chronic pain: Secondary | ICD-10-CM | POA: Diagnosis present

## 2020-03-05 DIAGNOSIS — R600 Localized edema: Secondary | ICD-10-CM | POA: Diagnosis present

## 2020-03-05 DIAGNOSIS — M6281 Muscle weakness (generalized): Secondary | ICD-10-CM | POA: Insufficient documentation

## 2020-03-05 DIAGNOSIS — M25561 Pain in right knee: Secondary | ICD-10-CM | POA: Diagnosis not present

## 2020-03-05 DIAGNOSIS — R2689 Other abnormalities of gait and mobility: Secondary | ICD-10-CM | POA: Insufficient documentation
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
## 2020-03-18 ENCOUNTER — Other Ambulatory Visit: Payer: Self-pay

## 2020-03-18 ENCOUNTER — Ambulatory Visit: Payer: Managed Care, Other (non HMO) | Attending: Orthopaedic Surgery | Admitting: Physical Therapy

## 2020-03-18 ENCOUNTER — Encounter: Payer: Self-pay | Admitting: Physical Therapy

## 2020-03-18 DIAGNOSIS — R2689 Other abnormalities of gait and mobility: Secondary | ICD-10-CM | POA: Insufficient documentation

## 2020-03-18 DIAGNOSIS — G8929 Other chronic pain: Secondary | ICD-10-CM

## 2020-03-18 DIAGNOSIS — M25561 Pain in right knee: Secondary | ICD-10-CM | POA: Insufficient documentation

## 2020-03-18 DIAGNOSIS — R600 Localized edema: Secondary | ICD-10-CM | POA: Insufficient documentation

## 2020-03-18 DIAGNOSIS — M6281 Muscle weakness (generalized): Secondary | ICD-10-CM | POA: Insufficient documentation

## 2020-03-18 NOTE — Therapy (Signed)
[place], [place], [address] Phone: [phone]   Fax:  [phone]  Physical Therapy Treatment  Patient Details  Name: Jonathan Taylor MRN: [id number] Date of Birth: 02-Feb-1986 Referring Provider (PT): : Douglass Rivers, MD    Encounter Date: 03/18/2020  PT End of Session - 03/18/20 1048    Visit Number  2    Number of Visits  17    Date for PT Re-Evaluation  04/30/20    Authorization Type  [employer]    PT Start Time  1049    PT Stop Time  1152    PT Time Calculation (min)  63 min    Activity Tolerance  Patient limited by pain    Behavior During Therapy  [facility] for tasks assessed/performed       Past Medical History:  Diagnosis Date  . Blood in stool   . Diabetes mellitus without complication ([place])   . GERD (gastroesophageal reflux disease)   . History of anal fissures   . Low sperm motility     Past Surgical History:  Procedure Laterality Date  . ANAL FISTULECTOMY  2012   with sphhincterotomy  . ANAL FISTULOTOMY N/A 06/29/2015   Procedure: EXCISION PERI RECTAL SCAR/FISTULA, INTERNAL HEMMORRHOIDAL LIGATION, PEXY, MARSUPIALIZATION;  Surgeon: Michael Boston, MD;  Location: [facility];  Service: General;  Laterality: N/A;  . EXAMINATION UNDER ANESTHESIA N/A 06/29/2015   Procedure: EXAM UNDER ANESTHESIA;  Surgeon: Michael Boston, MD;  Location: [facility];  Service: General;  Laterality: N/A;  . KNEE ARTHROSCOPY W/ MENISCAL REPAIR  08/08/2019  . KNEE SURGERY  07/07/2018  . RECTAL SURGERY  08/2010   Dr Zenia Resides    There were no vitals filed for this visit.  Subjective Assessment - 03/18/20 1051    Subjective  Pt reports that today is a bad day for him with pain.  He is still needing to use the crutches and brace.  Had a gel injection last week to see if that would help and it really hasn't.  Fell yesterday while trying to walk with one crutch - couch broke the fall some.    Currently in Pain?  Yes    Pain Score  6     Pain Location  Knee    Pain Orientation  Right    Pain Descriptors / Indicators  Aching;Throbbing;Sharp    Pain Type  Surgical pain;Chronic pain    Pain Onset  More than a month ago    Pain Frequency  Constant    Aggravating Factors   walking         [facility] PT Assessment - 03/18/20 0001      AROM   Right Knee Extension  -7    Right Knee Flexion  116                    [facility] Adult PT Treatment/Exercise - 03/18/20 0001      Knee/Hip Exercises: Aerobic   Nustep  L4x5'      Knee/Hip Exercises: Standing   Other Standing Knee Exercises  wt shift side/side with PT gaurding Rt knee, attempted in staggered stance Rt LE behind - had shooting sharp pain that caused LOB.       Knee/Hip Exercises: Seated   Long Arc [employer]  Strengthening;Right    Long Arc Quad Limitations  pain lateral knee       Knee/Hip Exercises: Supine   Short Arc [employer]  Strengthening;Both;2 sets;10 reps  5 sec holds    Short Arc [employer] Limitations  ball BTWN knees squeeezing.     Straight Leg Raises  Strengthening;Right   with only a couple inch lift     Modalities   Modalities  Moist Heat      Moist Heat Therapy   Number Minutes Moist Heat  12 Minutes    Moist Heat Location  Knee   Rt hamsting and lateral thigh     Manual Therapy   Manual Therapy  Soft tissue mobilization;Myofascial release;Taping    Soft tissue mobilization  STM to Rt ITB, distal hamstrings and lateral gastroc    Myofascial Release  rolling into distal ITB and hamstring    Kinesiotex  Create Space;Inhibit Muscle      Kinesiotix   Create Space  Rt lateral tibial plat    Inhibit Muscle   Rt distal HS and proximal gastroc                PT Short Term Goals - 03/05/20 1217      PT SHORT TERM GOAL #1   Title  Patient will be independent with initial HEP    Baseline  HEP provided 03/05/2020    Time  4    Period  Weeks    Status  New    Target Date  04/02/20      PT SHORT TERM GOAL #2   Title  Patient  will be able to verbalize and demonstrate appropriate posture    Baseline  Rounded shoulders, forward head, weight shifted to the left side    Time  4    Period  Weeks    Status  New    Target Date  04/02/20      PT SHORT TERM GOAL #3   Title  Patient will be able to increase knee flexion by >/= 5 degrees in order to allow for improved gait pattern    Baseline  Rt. knee flexion: 105 degrees    Time  4    Period  Weeks    Status  New    Target Date  04/02/20        PT Long Term Goals - 03/05/20 1221      PT LONG TERM GOAL #1   Title  Patient will be able to achieve 0 degrees of knee extension to allow for an improved gait pattern    Baseline  -7    Time  8    Period  Weeks    Status  New    Target Date  04/30/20      PT LONG TERM GOAL #2   Title  Patient will be able to achieve >/= 115 degrees of knee flexion    Baseline  Rt. Knee flexion: 105    Time  8    Period  Weeks    Status  New    Target Date  04/30/20      PT LONG TERM GOAL #3   Title  Patient will report an max 4/10 pain for >/= 1 week in order to improve quality of life w/ least restrictive assistive device    Baseline  Patient reports 5/10 pain    Time  8    Period  Weeks    Target Date  04/30/20      PT LONG TERM GOAL #4   Title  Patient will weight-bear through both LE equally to minimize stress on Lt. LE    Baseline  Weight  shift to the left    Time  8    Period  Weeks    Status  New    Target Date  04/30/20            Plan - 03/18/20 1142    Clinical Impression Statement  Jonathan Taylor presents with crutches and knee brace, WBAT.  He saw PA last week and had a gel injection into the knee with minimal relief. Also had a fall yesterday when he had shooting pain with standing.  He is very tight lateral knee, responded well to manual work to improve soft tissue pliability, demo'd improved knee flexion after this.  Tape applied to open up/lift tissue and to assist muscle.  Pt reported that this seemed to  decrease some of his pain as well.  He is able to contract the Rt quad well however fatigues quickly.  Only first visit since eval, no goals met. Pain is the biggest limiting factor.    Rehab Potential  Fair    PT Frequency  2x / week    PT Duration  8 weeks    PT Treatment/Interventions  ADLs/Self Care Home Management;Cryotherapy;Electrical Stimulation;Iontophoresis 54m/ml Dexamethasone;Moist Heat;Ultrasound;Parrafin;Fluidtherapy;Gait training;Stair training;Functional mobility training;Therapeutic activities;Therapeutic exercise;Balance training;Neuromuscular re-education;Patient/family education;Cognitive remediation;Orthotic Fit/Training;Wheelchair mobility training;Manual techniques;Manual lymph drainage;Compression bandaging;Scar mobilization;Passive range of motion;Dry needling;Energy conservation;Taping;Vasopneumatic Device;Vestibular;Joint Manipulations;Visual/perceptual remediation/compensation    PT Next Visit Plan  quad work, ROM manaul therapy if it was beneficial long term.    Consulted and Agree with Plan of Care  Patient       Patient will benefit from skilled therapeutic intervention in order to improve the following deficits and impairments:  Abnormal gait, Decreased activity tolerance, Decreased balance, Decreased coordination, Decreased endurance, Decreased strength, Decreased mobility, Decreased range of motion, Difficulty walking, Hypomobility, Increased edema, Increased fascial restricitons, Impaired sensation, Improper body mechanics, Postural dysfunction, Obesity, Pain  Visit Diagnosis: Chronic pain of right knee  Other abnormalities of gait and mobility  Muscle weakness (generalized)  Localized edema     Problem List Patient Active Problem List   Diagnosis Date Noted  . OSA (obstructive sleep apnea) 11/20/2017  . Low testosterone in male 07/11/2017  . Snoring 07/11/2017  . Chest pain with low risk for cardiac etiology 04/29/2017  . Erectile dysfunction  associated with type 2 diabetes mellitus ([place] 10/02/2016  . Essential hypertension 10/02/2016  . Panic attacks 12/02/2015  . Sleep disturbance 10/07/2015  . Possible exposure to STD 10/07/2015  . Diabetes mellitus type 2 in obese ([place] 03/14/2015  . Male fertility problem 03/14/2015  . Obesity, morbid, BMI 40.0-49.9 ([place] 11/17/2014  . Anal fistula 09/12/2011    SJeral PinchPT  03/18/2020, 11:46 AM  [facility] [address]Phone: [phone]  Fax:  [phone] Name: Jonathan WandellMRN: [id number] of Birth: [birth year]

## 2020-03-23 ENCOUNTER — Ambulatory Visit: Payer: Managed Care, Other (non HMO) | Admitting: Physical Therapy

## 2020-04-01 ENCOUNTER — Encounter: Payer: Managed Care, Other (non HMO) | Admitting: Physical Therapy

## 2020-04-06 ENCOUNTER — Ambulatory Visit: Payer: BLUE CROSS/BLUE SHIELD | Attending: Orthopaedic Surgery | Admitting: Physical Therapy

## 2020-04-06 ENCOUNTER — Encounter: Payer: Self-pay | Admitting: Physical Therapy

## 2020-04-06 ENCOUNTER — Other Ambulatory Visit: Payer: Self-pay

## 2020-04-06 DIAGNOSIS — G8929 Other chronic pain: Secondary | ICD-10-CM | POA: Diagnosis present

## 2020-04-06 DIAGNOSIS — R600 Localized edema: Secondary | ICD-10-CM

## 2020-04-06 DIAGNOSIS — M6281 Muscle weakness (generalized): Secondary | ICD-10-CM

## 2020-04-06 DIAGNOSIS — M25561 Pain in right knee: Secondary | ICD-10-CM | POA: Insufficient documentation

## 2020-04-06 DIAGNOSIS — R2689 Other abnormalities of gait and mobility: Secondary | ICD-10-CM | POA: Diagnosis present

## 2020-04-06 NOTE — Therapy (Signed)
[place], [place], [address] Phone: [phone]   Fax:  [phone]  Physical Therapy Treatment  Patient Details  Name: Jonathan Taylor MRN: [id number] Date of Birth: 12-28-1985 Referring Provider (PT): : Douglass Rivers, MD    Encounter Date: 04/06/2020  PT End of Session - 04/06/20 1017    Visit Number  3    Number of Visits  17    Date for PT Re-Evaluation  04/30/20    Authorization Type  [employer]    PT Start Time  1015   pt was brought back from waiting area at 10:15 but was on phone to MD until 10:35   PT Stop Time  1043   pt stated his head wasn't in it today and wanted to end his visit early   PT Time Calculation (min)  28 min    Activity Tolerance  Patient limited by pain    Behavior During Therapy  [place] for tasks assessed/performed       Past Medical History:  Diagnosis Date  . Blood in stool   . Diabetes mellitus without complication ([place])   . GERD (gastroesophageal reflux disease)   . History of anal fissures   . Low sperm motility     Past Surgical History:  Procedure Laterality Date  . ANAL FISTULECTOMY  2012   with sphhincterotomy  . ANAL FISTULOTOMY N/A 06/29/2015   Procedure: EXCISION PERI RECTAL SCAR/FISTULA, INTERNAL HEMMORRHOIDAL LIGATION, PEXY, MARSUPIALIZATION;  Surgeon: Michael Boston, MD;  Location: [facility];  Service: General;  Laterality: N/A;  . EXAMINATION UNDER ANESTHESIA N/A 06/29/2015   Procedure: EXAM UNDER ANESTHESIA;  Surgeon: Michael Boston, MD;  Location: [facility];  Service: General;  Laterality: N/A;  . KNEE ARTHROSCOPY W/ MENISCAL REPAIR  08/08/2019  . KNEE SURGERY  07/07/2018  . RECTAL SURGERY  08/2010   Dr Zenia Resides    There were no vitals filed for this visit.  Subjective Assessment - 04/06/20 1019    Subjective  " I am having continued issues in the R knee and now my L knee is really started to give me isuses. I am just frustrated with unemployement because I haven't  gotten any payment coming in. I am also applying for disability"    Patient Stated Goals  "Walk with one or no crutches"    Currently in Pain?  Yes                        [facility] Adult PT Treatment/Exercise - 04/06/20 0001      Kinesiotix   Create Space  Rt lateral tibial plat    Inhibit Muscle   Rt distal HS and proximal gastroc                PT Short Term Goals - 03/05/20 1217      PT SHORT TERM GOAL #1   Title  Patient will be independent with initial HEP    Baseline  HEP provided 03/05/2020    Time  4    Period  Weeks    Status  New    Target Date  04/02/20      PT SHORT TERM GOAL #2   Title  Patient will be able to verbalize and demonstrate appropriate posture    Baseline  Rounded shoulders, forward head, weight shifted to the left side    Time  4    Period  Weeks    Status  New  Target Date  04/02/20      PT SHORT TERM GOAL #3   Title  Patient will be able to increase knee flexion by >/= 5 degrees in order to allow for improved gait pattern    Baseline  Rt. knee flexion: 105 degrees    Time  4    Period  Weeks    Status  New    Target Date  04/02/20        PT Long Term Goals - 03/05/20 1221      PT LONG TERM GOAL #1   Title  Patient will be able to achieve 0 degrees of knee extension to allow for an improved gait pattern    Baseline  -7    Time  8    Period  Weeks    Status  New    Target Date  04/30/20      PT LONG TERM GOAL #2   Title  Patient will be able to achieve >/= 115 degrees of knee flexion    Baseline  Rt. Knee flexion: 105    Time  8    Period  Weeks    Status  New    Target Date  04/30/20      PT LONG TERM GOAL #3   Title  Patient will report an max 4/10 pain for >/= 1 week in order to improve quality of life w/ least restrictive assistive device    Baseline  Patient reports 5/10 pain    Time  8    Period  Weeks    Target Date  04/30/20      PT LONG TERM GOAL #4   Title  Patient will weight-bear through  both LE equally to minimize stress on Lt. LE    Baseline  Weight shift to the left    Time  8    Period  Weeks    Status  New    Target Date  04/30/20            Plan - 04/06/20 1033    Clinical Impression Statement  pt arrives reporting continued pain in the R knee which is progress now to the L secondary to overuse per pt report. limited session due to pt being on the phone to his MD and finished up at 10:35. KT  tape to the lateral aspect of the knee to promote pain relief. following he stated he just wasn't feeling PT today and opted to end his visit early.    PT Treatment/Interventions  ADLs/Self Care Home Management;Cryotherapy;Electrical Stimulation;Iontophoresis 4mg /ml Dexamethasone;Moist Heat;Ultrasound;Parrafin;Fluidtherapy;Gait training;Stair training;Functional mobility training;Therapeutic activities;Therapeutic exercise;Balance training;Neuromuscular re-education;Patient/family education;Cognitive remediation;Orthotic Fit/Training;Wheelchair mobility training;Manual techniques;Manual lymph drainage;Compression bandaging;Scar mobilization;Passive range of motion;Dry needling;Energy conservation;Taping;Vasopneumatic Device;Vestibular;Joint Manipulations;Visual/perceptual remediation/compensation    PT Next Visit Plan  quad work, ROM manaul therapy if it was beneficial long term.       Patient will benefit from skilled therapeutic intervention in order to improve the following deficits and impairments:  Abnormal gait, Decreased activity tolerance, Decreased balance, Decreased coordination, Decreased endurance, Decreased strength, Decreased mobility, Decreased range of motion, Difficulty walking, Hypomobility, Increased edema, Increased fascial restricitons, Impaired sensation, Improper body mechanics, Postural dysfunction, Obesity, Pain  Visit Diagnosis: Other abnormalities of gait and mobility  Muscle weakness (generalized)  Chronic pain of right knee  Localized  edema     Problem List Patient Active Problem List   Diagnosis Date Noted  . OSA (obstructive sleep apnea) 11/20/2017  . Low testosterone in male 07/11/2017  .  Snoring 07/11/2017  . Chest pain with low risk for cardiac etiology 04/29/2017  . Erectile dysfunction associated with type 2 diabetes mellitus ([place]) 10/02/2016  . Essential hypertension 10/02/2016  . Panic attacks 12/02/2015  . Sleep disturbance 10/07/2015  . Possible exposure to STD 10/07/2015  . Diabetes mellitus type 2 in obese ([place]) 03/14/2015  . Male fertility problem 03/14/2015  . Obesity, morbid, BMI 40.0-49.9 ([place]) 11/17/2014  . Anal fistula 09/12/2011   Starr Lake PT, DPT, LAT, ATC  04/06/20  10:58 AM      [facility] [address] [place], [place], [address] Phone: [phone]   Fax:  [phone]  Name: Jonathan Taylor MRN: [id number] Date of Birth: 22-Jun-1986

## 2020-04-08 ENCOUNTER — Ambulatory Visit: Payer: BLUE CROSS/BLUE SHIELD | Admitting: Physical Therapy

## 2020-04-13 ENCOUNTER — Ambulatory Visit: Payer: BLUE CROSS/BLUE SHIELD | Admitting: Physical Therapy

## 2020-04-13 ENCOUNTER — Other Ambulatory Visit: Payer: Self-pay

## 2020-04-13 DIAGNOSIS — G8929 Other chronic pain: Secondary | ICD-10-CM

## 2020-04-13 DIAGNOSIS — R2689 Other abnormalities of gait and mobility: Secondary | ICD-10-CM | POA: Diagnosis not present

## 2020-04-13 DIAGNOSIS — M6281 Muscle weakness (generalized): Secondary | ICD-10-CM

## 2020-04-13 DIAGNOSIS — R600 Localized edema: Secondary | ICD-10-CM

## 2020-04-13 NOTE — Therapy (Signed)
[place], [place], [address] Phone: [phone]   Fax:  [phone]  Physical Therapy Treatment  Patient Details  Name: Jonathan Taylor MRN: [id number] Date of Birth: October 26, 1986 Referring Provider (PT): : Douglass Rivers, MD    Encounter Date: 04/13/2020  PT End of Session - 04/13/20 1021    Visit Number  4    Number of Visits  17    Date for PT Re-Evaluation  04/30/20    Authorization Type  [employer]    PT Start Time  1022   pt arrived 7 min late   PT Stop Time  1110    PT Time Calculation (min)  48 min    Equipment Utilized During Treatment  Gait belt    Activity Tolerance  Patient limited by pain    Behavior During Therapy  [facility] for tasks assessed/performed       Past Medical History:  Diagnosis Date  . Blood in stool   . Diabetes mellitus without complication ([place])   . GERD (gastroesophageal reflux disease)   . History of anal fissures   . Low sperm motility     Past Surgical History:  Procedure Laterality Date  . ANAL FISTULECTOMY  2012   with sphhincterotomy  . ANAL FISTULOTOMY N/A 06/29/2015   Procedure: EXCISION PERI RECTAL SCAR/FISTULA, INTERNAL HEMMORRHOIDAL LIGATION, PEXY, MARSUPIALIZATION;  Surgeon: Michael Boston, MD;  Location: [facility];  Service: General;  Laterality: N/A;  . EXAMINATION UNDER ANESTHESIA N/A 06/29/2015   Procedure: EXAM UNDER ANESTHESIA;  Surgeon: Michael Boston, MD;  Location: [facility];  Service: General;  Laterality: N/A;  . KNEE ARTHROSCOPY W/ MENISCAL REPAIR  08/08/2019  . KNEE SURGERY  07/07/2018  . RECTAL SURGERY  08/2010   Dr Zenia Resides    There were no vitals filed for this visit.  Subjective Assessment - 04/13/20 1022    Subjective  " I just took the last amount of the KT tape off today. I like the tape better then the brace I have"    Currently in Pain?  Yes    Pain Score  6     Pain Orientation  Right;Lateral    Pain Descriptors / Indicators  Aching    Pain Type   Chronic pain         [facility] PT Assessment - 04/13/20 0001      Assessment   Medical Diagnosis  Unilateral primary osteoarthritis, right knee M17.11    Referring Provider (PT)  : Douglass Rivers, MD                     [facility] Adult PT Treatment/Exercise - 04/13/20 0001      Knee/Hip Exercises: Standing   Gait Training  heel strike/ toe off x 6 in // using bil crutches. 1 x 100 ft with bil crutches taking small steps focusing on heel strike   attempted 1 crutch but pt had difficulty      Knee/Hip Exercises: Seated   Long Arc Quad  Strengthening;4 sets;10 reps   with ball squeeze RLE only for VMO activation     Moist Heat Therapy   Number Minutes Moist Heat  10 Minutes    Moist Heat Location  Knee   R knee in supine     Manual Therapy   Manual Therapy  Joint mobilization    Joint Mobilization  medial patellar mobs grade II-III      Kinesiotix   Create Space  Rt lateral  tibial plat    Inhibit Muscle   Rt distal HS and proximal gastroc                PT Short Term Goals - 03/05/20 1217      PT SHORT TERM GOAL #1   Title  Patient will be independent with initial HEP    Baseline  HEP provided 03/05/2020    Time  4    Period  Weeks    Status  New    Target Date  04/02/20      PT SHORT TERM GOAL #2   Title  Patient will be able to verbalize and demonstrate appropriate posture    Baseline  Rounded shoulders, forward head, weight shifted to the left side    Time  4    Period  Weeks    Status  New    Target Date  04/02/20      PT SHORT TERM GOAL #3   Title  Patient will be able to increase knee flexion by >/= 5 degrees in order to allow for improved gait pattern    Baseline  Rt. knee flexion: 105 degrees    Time  4    Period  Weeks    Status  New    Target Date  04/02/20        PT Long Term Goals - 03/05/20 1221      PT LONG TERM GOAL #1   Title  Patient will be able to achieve 0 degrees of knee extension to allow for an improved gait pattern     Baseline  -7    Time  8    Period  Weeks    Status  New    Target Date  04/30/20      PT LONG TERM GOAL #2   Title  Patient will be able to achieve >/= 115 degrees of knee flexion    Baseline  Rt. Knee flexion: 105    Time  8    Period  Weeks    Status  New    Target Date  04/30/20      PT LONG TERM GOAL #3   Title  Patient will report an max 4/10 pain for >/= 1 week in order to improve quality of life w/ least restrictive assistive device    Baseline  Patient reports 5/10 pain    Time  8    Period  Weeks    Target Date  04/30/20      PT LONG TERM GOAL #4   Title  Patient will weight-bear through both LE equally to minimize stress on Lt. LE    Baseline  Weight shift to the left    Time  8    Period  Weeks    Status  New    Target Date  04/30/20            Plan - 04/13/20 1054    Clinical Impression Statement  pt arrives reporting conitnued pain but states the KT tape continues to provide relief. worked on patellar mobility with manual medial mobs and VMO facilittion exercises. continued application of KT tape for pain. focused remainder of session on gait training with bil crutches focusing on heel strike/ toe off and takeing shorter stride, He did well but fatigued very quickly and demonstrated apprehension while standing on the RLE. utilized MHP end of session to calm down soreness following session.    PT Treatment/Interventions  ADLs/Self Care Home Management;Cryotherapy;Dealer  Stimulation;Iontophoresis 4mg /ml Dexamethasone;Moist Heat;Ultrasound;Parrafin;Fluidtherapy;Gait training;Stair training;Functional mobility training;Therapeutic activities;Therapeutic exercise;Balance training;Neuromuscular re-education;Patient/family education;Cognitive remediation;Orthotic Fit/Training;Wheelchair mobility training;Manual techniques;Manual lymph drainage;Compression bandaging;Scar mobilization;Passive range of motion;Dry needling;Energy conservation;Taping;Vasopneumatic  Device;Vestibular;Joint Manipulations;Visual/perceptual remediation/compensation    PT Next Visit Plan  quad work, ROM manaul therapy if it was beneficial long term. gait training,    PT Home Exercise Plan  [id number]    Consulted and Agree with Plan of Care  Patient       Patient will benefit from skilled therapeutic intervention in order to improve the following deficits and impairments:  Abnormal gait, Decreased activity tolerance, Decreased balance, Decreased coordination, Decreased endurance, Decreased strength, Decreased mobility, Decreased range of motion, Difficulty walking, Hypomobility, Increased edema, Increased fascial restricitons, Impaired sensation, Improper body mechanics, Postural dysfunction, Obesity, Pain  Visit Diagnosis: Other abnormalities of gait and mobility  Muscle weakness (generalized)  Chronic pain of right knee  Localized edema     Problem List Patient Active Problem List   Diagnosis Date Noted  . OSA (obstructive sleep apnea) 11/20/2017  . Low testosterone in male 07/11/2017  . Snoring 07/11/2017  . Chest pain with low risk for cardiac etiology 04/29/2017  . Erectile dysfunction associated with type 2 diabetes mellitus ([place]) 10/02/2016  . Essential hypertension 10/02/2016  . Panic attacks 12/02/2015  . Sleep disturbance 10/07/2015  . Possible exposure to STD 10/07/2015  . Diabetes mellitus type 2 in obese ([place]) 03/14/2015  . Male fertility problem 03/14/2015  . Obesity, morbid, BMI 40.0-49.9 ([place]) 11/17/2014  . Anal fistula 09/12/2011   Starr Lake PT, DPT, LAT, ATC  04/13/20  10:58 AM      [facility] [address] [place], [place], [address] Phone: [phone]   Fax:  [phone]  Name: Jerin Franzel MRN: [id number] Date of Birth: [birth year]

## 2020-04-15 ENCOUNTER — Encounter: Payer: Self-pay | Admitting: Physical Therapy

## 2020-04-15 ENCOUNTER — Other Ambulatory Visit: Payer: Self-pay

## 2020-04-15 ENCOUNTER — Ambulatory Visit: Payer: BLUE CROSS/BLUE SHIELD | Admitting: Physical Therapy

## 2020-04-15 DIAGNOSIS — R600 Localized edema: Secondary | ICD-10-CM

## 2020-04-15 DIAGNOSIS — R2689 Other abnormalities of gait and mobility: Secondary | ICD-10-CM

## 2020-04-15 DIAGNOSIS — G8929 Other chronic pain: Secondary | ICD-10-CM

## 2020-04-15 DIAGNOSIS — M6281 Muscle weakness (generalized): Secondary | ICD-10-CM

## 2020-04-15 NOTE — Therapy (Signed)
[place], [place], [address] Phone: [phone]   Fax:  [phone]  Physical Therapy Treatment  Patient Details  Name: Jonathan Taylor MRN: [id number] Date of Birth: 04-15-1986 Referring Provider (PT): : Douglass Rivers, MD    Encounter Date: 04/15/2020   PT End of Session - 04/15/20 1018    Visit Number 5    Number of Visits 17    Date for PT Re-Evaluation 04/30/20    Authorization Type [employer]    PT Start Time 1016    PT Stop Time 1105    PT Time Calculation (min) 49 min    Activity Tolerance Patient limited by pain    Behavior During Therapy [facility] for tasks assessed/performed           Past Medical History:  Diagnosis Date  . Blood in stool   . Diabetes mellitus without complication ([place])   . GERD (gastroesophageal reflux disease)   . History of anal fissures   . Low sperm motility     Past Surgical History:  Procedure Laterality Date  . ANAL FISTULECTOMY  2012   with sphhincterotomy  . ANAL FISTULOTOMY N/A 06/29/2015   Procedure: EXCISION PERI RECTAL SCAR/FISTULA, INTERNAL HEMMORRHOIDAL LIGATION, PEXY, MARSUPIALIZATION;  Surgeon: Michael Boston, MD;  Location: [facility];  Service: General;  Laterality: N/A;  . EXAMINATION UNDER ANESTHESIA N/A 06/29/2015   Procedure: EXAM UNDER ANESTHESIA;  Surgeon: Michael Boston, MD;  Location: [facility];  Service: General;  Laterality: N/A;  . KNEE ARTHROSCOPY W/ MENISCAL REPAIR  08/08/2019  . KNEE SURGERY  07/07/2018  . RECTAL SURGERY  08/2010   Dr Zenia Resides    There were no vitals filed for this visit.   Subjective Assessment - 04/15/20 1020    Subjective " I think I am alittle more sore from the weather, I think the KT tape helped the most today"    Currently in Pain? Yes    Pain Score 6     Pain Location Knee    Pain Orientation Right;Left    Pain Descriptors / Indicators Aching    Pain Type Chronic pain    Pain Onset More than a month ago    Pain Frequency  Intermittent    Aggravating Factors  walking/ standing    Pain Relieving Factors medication              [facility] PT Assessment - 04/15/20 0001      Assessment   Medical Diagnosis Unilateral primary osteoarthritis, right knee M17.11    Referring Provider (PT) : Douglass Rivers, MD                          [facility] Adult PT Treatment/Exercise - 04/15/20 0001      Knee/Hip Exercises: Seated   Long Arc Quad Strengthening;4 sets;10 reps   combined with ball squeeze for VMO facilitation   Heel Slides Strengthening;2 sets;20 reps   with ball squeeze   Clamshell with TheraBand Green   4 x 10 reps, pt opted to going through reps before stopping   Marching 10 reps;Strengthening;Right   RLE onl with green theraband around the knee   Sit to Sand without UE support;5 reps;3 sets   with ball between knees     Moist Heat Therapy   Number Minutes Moist Heat 10 Minutes    Moist Heat Location Knee      Manual Therapy   Joint Mobilization medial patellar mobs  grade II-III      Kinesiotix   Create Space Rt lateral tibial plat, and lateral > med patellar taping    Inhibit Muscle  Rt distal HS and proximal gastroc                   PT Education - 04/15/20 1036    Education Details anatomy of the patellofemoral joint and distal ITB. pain education with repetitive verbal cues that pain isn't damage. sit to stand biomechanics.    Person(s) Educated Patient    Methods Explanation;Verbal cues;Handout    Comprehension Verbalized understanding;Verbal cues required            PT Short Term Goals - 03/05/20 1217      PT SHORT TERM GOAL #1   Title Patient will be independent with initial HEP    Baseline HEP provided 03/05/2020    Time 4    Period Weeks    Status New    Target Date 04/02/20      PT SHORT TERM GOAL #2   Title Patient will be able to verbalize and demonstrate appropriate posture    Baseline Rounded shoulders, forward head, weight shifted to the left side     Time 4    Period Weeks    Status New    Target Date 04/02/20      PT SHORT TERM GOAL #3   Title Patient will be able to increase knee flexion by >/= 5 degrees in order to allow for improved gait pattern    Baseline Rt. knee flexion: 105 degrees    Time 4    Period Weeks    Status New    Target Date 04/02/20             PT Long Term Goals - 03/05/20 1221      PT LONG TERM GOAL #1   Title Patient will be able to achieve 0 degrees of knee extension to allow for an improved gait pattern    Baseline -7    Time 8    Period Weeks    Status New    Target Date 04/30/20      PT LONG TERM GOAL #2   Title Patient will be able to achieve >/= 115 degrees of knee flexion    Baseline Rt. Knee flexion: 105    Time 8    Period Weeks    Status New    Target Date 04/30/20      PT LONG TERM GOAL #3   Title Patient will report an max 4/10 pain for >/= 1 week in order to improve quality of life w/ least restrictive assistive device    Baseline Patient reports 5/10 pain    Time 8    Period Weeks    Target Date 04/30/20      PT LONG TERM GOAL #4   Title Patient will weight-bear through both LE equally to minimize stress on Lt. LE    Baseline Weight shift to the left    Time 8    Period Weeks    Status New    Target Date 04/30/20                 Plan - 04/15/20 1051    Clinical Impression Statement pt continues to note improvement of knee function with the KT tape applied, and noted improvement with added patellofemoral component. Continued working on hip/ knee strengthening with emphasis to for increased reps and focus on patellar  stability. pt continued to demonstrate apprehension with activity and demonstrated 1 x of the R knee buckling but was able to sit back down on to the table. continued MHp end of session.    PT Treatment/Interventions ADLs/Self Care Home Management;Cryotherapy;Electrical Stimulation;Iontophoresis 4mg /ml Dexamethasone;Moist  Heat;Ultrasound;Parrafin;Fluidtherapy;Gait training;Stair training;Functional mobility training;Therapeutic activities;Therapeutic exercise;Balance training;Neuromuscular re-education;Patient/family education;Cognitive remediation;Orthotic Fit/Training;Wheelchair mobility training;Manual techniques;Manual lymph drainage;Compression bandaging;Scar mobilization;Passive range of motion;Dry needling;Energy conservation;Taping;Vasopneumatic Device;Vestibular;Joint Manipulations;Visual/perceptual remediation/compensation    PT Next Visit Plan quad work, ROM manaul therapy if it was beneficial long term. gait training,    Consulted and Agree with Plan of Care Patient           Patient will benefit from skilled therapeutic intervention in order to improve the following deficits and impairments:  Abnormal gait, Decreased activity tolerance, Decreased balance, Decreased coordination, Decreased endurance, Decreased strength, Decreased mobility, Decreased range of motion, Difficulty walking, Hypomobility, Increased edema, Increased fascial restricitons, Impaired sensation, Improper body mechanics, Postural dysfunction, Obesity, Pain  Visit Diagnosis: Other abnormalities of gait and mobility  Muscle weakness (generalized)  Chronic pain of right knee  Localized edema     Problem List Patient Active Problem List   Diagnosis Date Noted  . OSA (obstructive sleep apnea) 11/20/2017  . Low testosterone in male 07/11/2017  . Snoring 07/11/2017  . Chest pain with low risk for cardiac etiology 04/29/2017  . Erectile dysfunction associated with type 2 diabetes mellitus ([place]) 10/02/2016  . Essential hypertension 10/02/2016  . Panic attacks 12/02/2015  . Sleep disturbance 10/07/2015  . Possible exposure to STD 10/07/2015  . Diabetes mellitus type 2 in obese ([place]) 03/14/2015  . Male fertility problem 03/14/2015  . Obesity, morbid, BMI 40.0-49.9 ([place]) 11/17/2014  . Anal fistula 09/12/2011   Starr Lake PT, DPT, LAT, ATC  04/15/20  10:55 AM      [facility] [address] [place], [place], [address] Phone: [phone]   Fax:  [phone]  Name: Tasean Mancha MRN: [id number] Date of Birth: 01/17/1986

## 2020-04-20 ENCOUNTER — Ambulatory Visit: Payer: BLUE CROSS/BLUE SHIELD | Admitting: Physical Therapy

## 2020-04-20 ENCOUNTER — Other Ambulatory Visit: Payer: Self-pay

## 2020-04-20 DIAGNOSIS — M6281 Muscle weakness (generalized): Secondary | ICD-10-CM

## 2020-04-20 DIAGNOSIS — R2689 Other abnormalities of gait and mobility: Secondary | ICD-10-CM | POA: Diagnosis not present

## 2020-04-20 DIAGNOSIS — G8929 Other chronic pain: Secondary | ICD-10-CM

## 2020-04-20 DIAGNOSIS — R600 Localized edema: Secondary | ICD-10-CM

## 2020-04-20 NOTE — Therapy (Signed)
[place], [place], [address] Phone: [phone]   Fax:  [phone]  Physical Therapy Treatment  Patient Details  Name: Jonathan Taylor MRN: [id number] Date of Birth: [birth year] Referring Provider (PT): : Douglass Rivers, MD    Encounter Date: 04/20/2020   PT End of Session - 04/20/20 1023    Visit Number 6    Number of Visits 17    Date for PT Re-Evaluation 04/30/20    Authorization Type [employer]    PT Start Time 7253   pt checked in at 10:17 but walked out to his car and didn't return intil 10:23   PT Stop Time 1101    PT Time Calculation (min) 38 min    Activity Tolerance Patient limited by pain    Behavior During Therapy [facility] for tasks assessed/performed           Past Medical History:  Diagnosis Date  . Blood in stool   . Diabetes mellitus without complication ([place])   . GERD (gastroesophageal reflux disease)   . History of anal fissures   . Low sperm motility     Past Surgical History:  Procedure Laterality Date  . ANAL FISTULECTOMY  2012   with sphhincterotomy  . ANAL FISTULOTOMY N/A 06/29/2015   Procedure: EXCISION PERI RECTAL SCAR/FISTULA, INTERNAL HEMMORRHOIDAL LIGATION, PEXY, MARSUPIALIZATION;  Surgeon: Michael Boston, MD;  Location: [facility];  Service: General;  Laterality: N/A;  . EXAMINATION UNDER ANESTHESIA N/A 06/29/2015   Procedure: EXAM UNDER ANESTHESIA;  Surgeon: Michael Boston, MD;  Location: [facility];  Service: General;  Laterality: N/A;  . KNEE ARTHROSCOPY W/ MENISCAL REPAIR  08/08/2019  . KNEE SURGERY  07/07/2018  . RECTAL SURGERY  08/2010   Dr Zenia Resides    There were no vitals filed for this visit.   Subjective Assessment - 04/20/20 1027    Subjective "    Currently in Pain? Yes    Pain Score 5     Pain Location Knee    Pain Orientation Right    Pain Descriptors / Indicators Aching    Pain Onset More than a month ago    Pain Frequency Intermittent              [facility] PT  Assessment - 04/20/20 0001      Assessment   Medical Diagnosis Unilateral primary osteoarthritis, right knee M17.11    Referring Provider (PT) : Douglass Rivers, MD       Observation/Other Assessments   Focus on Therapeutic Outcomes (FOTO)  78% limited                          [facility] Adult PT Treatment/Exercise - 04/20/20 0001      Knee/Hip Exercises: Aerobic   Nustep L5 x 5 min LE only   focus on controlled motion     Knee/Hip Exercises: Standing   Hip Abduction 10 reps;Right;Stengthening;3 sets   in //   Hip Extension Stengthening;10 reps;3 sets;Right   in //   Gait Training heel strike/ toe off x 6 in // using bil crutches. 1 x 100 ft with bil crutches taking small steps focusing on heel strike      Moist Heat Therapy   Number Minutes Moist Heat 10 Minutes    Moist Heat Location Knee      Manual Therapy   Manual Therapy Taping    McConnell Lateral > medial   trial  Kinesiotix   Create Space Rt lateral tibial plat, and lateral > med patellar taping                  PT Education - 04/20/20 1100    Education Details benefits of McConnel taping to promote patellar stability, and reviewed anatomy of the patellofemoral joint. reviewed FOTO reassessment.    Person(s) Educated Patient    Methods Explanation;Verbal cues    Comprehension Verbalized understanding;Verbal cues required            PT Short Term Goals - 03/05/20 1217      PT SHORT TERM GOAL #1   Title Patient will be independent with initial HEP    Baseline HEP provided 03/05/2020    Time 4    Period Weeks    Status New    Target Date 04/02/20      PT SHORT TERM GOAL #2   Title Patient will be able to verbalize and demonstrate appropriate posture    Baseline Rounded shoulders, forward head, weight shifted to the left side    Time 4    Period Weeks    Status New    Target Date 04/02/20      PT SHORT TERM GOAL #3   Title Patient will be able to increase knee flexion by >/= 5  degrees in order to allow for improved gait pattern    Baseline Rt. knee flexion: 105 degrees    Time 4    Period Weeks    Status New    Target Date 04/02/20             PT Long Term Goals - 03/05/20 1221      PT LONG TERM GOAL #1   Title Patient will be able to achieve 0 degrees of knee extension to allow for an improved gait pattern    Baseline -7    Time 8    Period Weeks    Status New    Target Date 04/30/20      PT LONG TERM GOAL #2   Title Patient will be able to achieve >/= 115 degrees of knee flexion    Baseline Rt. Knee flexion: 105    Time 8    Period Weeks    Status New    Target Date 04/30/20      PT LONG TERM GOAL #3   Title Patient will report an max 4/10 pain for >/= 1 week in order to improve quality of life w/ least restrictive assistive device    Baseline Patient reports 5/10 pain    Time 8    Period Weeks    Target Date 04/30/20      PT LONG TERM GOAL #4   Title Patient will weight-bear through both LE equally to minimize stress on Lt. LE    Baseline Weight shift to the left    Time 8    Period Weeks    Status New    Target Date 04/30/20                 Plan - 04/20/20 1057    Clinical Impression Statement pt notes pain dropped to a 5/10 today. Trialed McConnel taping of the R knee pulling the patellar medially and continued KT taping. worked on [employer] certified [employer] which he fatigued quickly and demonstrating increaesd level of apprehension regarding R knee pain and instability.    PT Treatment/Interventions ADLs/Self Care Home Management;Cryotherapy;Electrical Stimulation;Iontophoresis 4mg /ml Dexamethasone;Moist Heat;Ultrasound;Parrafin;Fluidtherapy;Gait training;Stair training;Functional mobility  training;Therapeutic activities;Therapeutic exercise;Balance training;Neuromuscular re-education;Patient/family education;Cognitive remediation;Orthotic Fit/Training;Wheelchair mobility training;Manual techniques;Manual lymph  drainage;Compression bandaging;Scar mobilization;Passive range of motion;Dry needling;Energy conservation;Taping;Vasopneumatic Device;Vestibular;Joint Manipulations;Visual/perceptual remediation/compensation    PT Next Visit Plan quad work, ROM manaul therapy if it was beneficial long term. gait training, how was McConnel tape           Patient will benefit from skilled therapeutic intervention in order to improve the following deficits and impairments:  Abnormal gait, Decreased activity tolerance, Decreased balance, Decreased coordination, Decreased endurance, Decreased strength, Decreased mobility, Decreased range of motion, Difficulty walking, Hypomobility, Increased edema, Increased fascial restricitons, Impaired sensation, Improper body mechanics, Postural dysfunction, Obesity, Pain  Visit Diagnosis: Other abnormalities of gait and mobility  Muscle weakness (generalized)  Chronic pain of right knee  Localized edema     Problem List Patient Active Problem List   Diagnosis Date Noted  . OSA (obstructive sleep apnea) 11/20/2017  . Low testosterone in male 07/11/2017  . Snoring 07/11/2017  . Chest pain with low risk for cardiac etiology 04/29/2017  . Erectile dysfunction associated with type 2 diabetes mellitus ([place]) 10/02/2016  . Essential hypertension 10/02/2016  . Panic attacks 12/02/2015  . Sleep disturbance 10/07/2015  . Possible exposure to STD 10/07/2015  . Diabetes mellitus type 2 in obese ([place]) 03/14/2015  . Male fertility problem 03/14/2015  . Obesity, morbid, BMI 40.0-49.9 ([place]) 11/17/2014  . Anal fistula 09/12/2011   Starr Lake PT, DPT, LAT, ATC  04/20/20  11:02 AM      [place] [facility] [address] [place], [place], [address] Phone: [phone]   Fax:  [phone]  Name: Izaah Westman MRN: [id number] Date of Birth: 08-02-1986

## 2020-04-22 ENCOUNTER — Ambulatory Visit: Payer: BLUE CROSS/BLUE SHIELD | Admitting: Physical Therapy

## 2020-04-27 ENCOUNTER — Ambulatory Visit: Payer: BLUE CROSS/BLUE SHIELD | Admitting: Physical Therapy

## 2020-04-29 ENCOUNTER — Ambulatory Visit: Payer: BLUE CROSS/BLUE SHIELD | Admitting: Physical Therapy

## 2020-04-29 ENCOUNTER — Other Ambulatory Visit: Payer: Self-pay

## 2020-04-29 ENCOUNTER — Encounter: Payer: Self-pay | Admitting: Physical Therapy

## 2020-04-29 DIAGNOSIS — R600 Localized edema: Secondary | ICD-10-CM

## 2020-04-29 DIAGNOSIS — G8929 Other chronic pain: Secondary | ICD-10-CM

## 2020-04-29 DIAGNOSIS — R2689 Other abnormalities of gait and mobility: Secondary | ICD-10-CM | POA: Diagnosis not present

## 2020-04-29 DIAGNOSIS — M6281 Muscle weakness (generalized): Secondary | ICD-10-CM

## 2020-04-29 NOTE — Therapy (Signed)
[place], [place], [address] Phone: [phone]   Fax:  [phone]  Physical Therapy Treatment / Re-certification  Patient Details  Name: Jonathan Taylor MRN: [id number] Date of Birth: 03-23-1986 Referring Provider (PT): : Douglass Rivers, MD    Encounter Date: 04/29/2020   PT End of Session - 04/29/20 1021    Visit Number 7    Number of Visits 17    Date for PT Re-Evaluation 06/24/20    Authorization Type [employer]    PT Start Time 1022   pt arrived 7 min late   PT Stop Time 1110    PT Time Calculation (min) 48 min    Activity Tolerance Patient limited by pain    Behavior During Therapy [facility] for tasks assessed/performed           Past Medical History:  Diagnosis Date  . Blood in stool   . Diabetes mellitus without complication ([place])   . GERD (gastroesophageal reflux disease)   . History of anal fissures   . Low sperm motility     Past Surgical History:  Procedure Laterality Date  . ANAL FISTULECTOMY  2012   with sphhincterotomy  . ANAL FISTULOTOMY N/A 06/29/2015   Procedure: EXCISION PERI RECTAL SCAR/FISTULA, INTERNAL HEMMORRHOIDAL LIGATION, PEXY, MARSUPIALIZATION;  Surgeon: Michael Boston, MD;  [facility];  Service: General;  Laterality: N/A;  . EXAMINATION UNDER ANESTHESIA N/A 06/29/2015   Procedure: EXAM UNDER ANESTHESIA;  Surgeon: Michael Boston, MD;  Location[facility];  Service: General;  Laterality: N/A;  . KNEE ARTHROSCOPY W/ MENISCAL REPAIR  08/08/2019  . KNEE SURGERY  07/07/2018  . RECTAL SURGERY  08/2010   Dr Zenia Resides    There were no vitals filed for this visit.   Subjective Assessment - 04/29/20 1022    Subjective "I am having a bad day today, we just got back from [place] and it was a long drive. I am at about a 6/10"    Patient Stated Goals "Walk with one or no crutches"    Currently in Pain? Yes    Pain Score 6     Pain Location Knee    Pain Orientation Right    Pain  Descriptors / Indicators Aching;Sore    Pain Type Chronic pain              [facility] PT Assessment - 04/29/20 0001      Assessment   Medical Diagnosis Unilateral primary osteoarthritis, right knee M17.11    Referring Provider (PT) : Douglass Rivers, MD       AROM   Right Knee Extension -10    Right Knee Flexion 117      Strength   Strength Assessment Site Knee    Right/Left Knee Right    Right Knee Flexion 3-/5    Right Knee Extension 3-/5                         [facility] Adult PT Treatment/Exercise - 04/29/20 0001      Knee/Hip Exercises: Aerobic   Nustep L5 x 5 min LE only      Knee/Hip Exercises: Supine   Quad Sets 2 sets;10 reps   with ball squeeze for VMO activation   Straight Leg Raise with External Rotation Strengthening;2 sets;10 reps;Right      Moist Heat Therapy   Number Minutes Moist Heat 10 Minutes    Moist Heat Location Knee  Manual Therapy   McConnell Lateral > medial      Kinesiotix   Create Space Rt lateral tibial plat, and lateral > med patellar taping    Inhibit Muscle  Rt distal HS and proximal gastroc                   PT Education - 04/29/20 1058    Education Details reviewed HEP and discussed importance of consistency. discussed limited functional measureable progress since initial evaluation that plan to conitnue with treatment but if no more progress is made in the next set of visits then the plan will be to referral back to MD for further assessment.    Person(s) Educated Patient    Methods Explanation;Verbal cues;Handout    Comprehension Verbalized understanding;Verbal cues required            PT Short Term Goals - 04/29/20 1027      PT SHORT TERM GOAL #1   Title Patient will be independent with initial HEP    Period Weeks    Status Achieved      PT SHORT TERM GOAL #2   Title Patient will be able to verbalize and demonstrate appropriate posture    Period Weeks    Status Partially Met      PT SHORT TERM  GOAL #3   Title Patient will be able to increase knee flexion by >/= 5 degrees in order to allow for improved gait pattern    Period Weeks             PT Long Term Goals - 04/29/20 1100      PT LONG TERM GOAL #1   Title Patient will be able to achieve 0 degrees of knee extension to allow for an improved gait pattern    Period Weeks    Status On-going      PT LONG TERM GOAL #2   Title Patient will be able to achieve >/= 115 degrees of knee flexion    Period Weeks    Status Achieved      PT LONG TERM GOAL #3   Title Patient will report an max 4/10 pain for >/= 1 week in order to improve quality of life w/ least restrictive assistive device    Period Weeks    Status On-going      PT LONG TERM GOAL #4   Title Patient will weight-bear through both LE equally to minimize stress on Lt. LE    Period Weeks    Status On-going                 Plan - 04/29/20 1100    Clinical Impression Statement pt continues to report constant pain in the knee flcutuating in severity today to a 6/10. He does report feeling a little better with the use of KT tape but continues to note limited knee stability and intermittent buckling. He has made limited progress toward his goals and noted 10% improvement in his FOTO score previously. continued working on patellar mobiltiy and VMO activation which he does note some pain relief with but fatigues quickly secondary to pain. Plan to continue with current treatment POC to work on reducing pain, inflammation, improve strength and gait efficiency. If no functional / measureable progress is made then plan to refer back to his MD, which pt verbalized understanding.    PT Frequency 2x / week    PT Duration 6 weeks    PT Treatment/Interventions ADLs/Self Care Home Management;Cryotherapy;Electrical Stimulation;Iontophoresis  47m/ml Dexamethasone;Moist Heat;Ultrasound;Parrafin;Fluidtherapy;Gait training;Stair training;Functional mobility training;Therapeutic  activities;Therapeutic exercise;Balance training;Neuromuscular re-education;Patient/family education;Cognitive remediation;Orthotic Fit/Training;Wheelchair mobility training;Manual techniques;Manual lymph drainage;Compression bandaging;Scar mobilization;Passive range of motion;Dry needling;Energy conservation;Taping;Vasopneumatic Device;Vestibular;Joint Manipulations;Visual/perceptual remediation/compensation    PT Next Visit Plan quad work, ROM manaul therapy if it was beneficial long term. gait training, how was McConnel tape    [place] Agree with Plan of Care Patient           Patient will benefit from skilled therapeutic intervention in order to improve the following deficits and impairments:  Abnormal gait, Decreased activity tolerance, Decreased balance, Decreased coordination, Decreased endurance, Decreased strength, Decreased mobility, Decreased range of motion, Difficulty walking, Hypomobility, Increased edema, Increased fascial restricitons, Impaired sensation, Improper body mechanics, Postural dysfunction, Obesity, Pain  Visit Diagnosis: Other abnormalities of gait and mobility  Muscle weakness (generalized)  Chronic pain of right knee  Localized edema     Problem List Patient Active Problem List   Diagnosis Date Noted  . OSA (obstructive sleep apnea) 11/20/2017  . Low testosterone in male 07/11/2017  . Snoring 07/11/2017  . Chest pain with low risk for cardiac etiology 04/29/2017  . Erectile dysfunction associated with type 2 diabetes mellitus ([place] 10/02/2016  . Essential hypertension 10/02/2016  . Panic attacks 12/02/2015  . Sleep disturbance 10/07/2015  . Possible exposure to STD 10/07/2015  . Diabetes mellitus type 2 in obese ([place] 03/14/2015  . Male fertility problem 03/14/2015  . Obesity, morbid, BMI 40.0-49.9 ([place] 11/17/2014  . Anal fistula 09/12/2011   KStarr LakePT, DPT, LAT, ATC  04/29/20  11:07  AM      [facility] [address]Phone: [phone]  Fax:  [phone] Name: Jonathan DorvilMRN: [id number] of Birth: [birth year]

## 2020-04-30 ENCOUNTER — Other Ambulatory Visit: Payer: Self-pay | Admitting: Nurse Practitioner

## 2020-04-30 DIAGNOSIS — E785 Hyperlipidemia, unspecified: Secondary | ICD-10-CM

## 2020-05-04 ENCOUNTER — Ambulatory Visit: Payer: BLUE CROSS/BLUE SHIELD | Admitting: Physical Therapy

## 2020-05-11 ENCOUNTER — Other Ambulatory Visit: Payer: Self-pay

## 2020-05-11 ENCOUNTER — Ambulatory Visit: Payer: BLUE CROSS/BLUE SHIELD | Admitting: Physical Therapy

## 2020-05-11 ENCOUNTER — Telehealth: Payer: Self-pay

## 2020-05-11 DIAGNOSIS — I10 Essential (primary) hypertension: Secondary | ICD-10-CM

## 2020-05-11 DIAGNOSIS — E785 Hyperlipidemia, unspecified: Secondary | ICD-10-CM

## 2020-05-11 MED ORDER — ATORVASTATIN CALCIUM 20 MG PO TABS: 20.0000 mg | ORAL_TABLET | Freq: Every day | ORAL | 0 refills | Status: DC

## 2020-05-11 MED ORDER — LOSARTAN POTASSIUM 50 MG PO TABS: 50.0000 mg | ORAL_TABLET | Freq: Every day | ORAL | 0 refills | Status: DC

## 2020-05-11 NOTE — Telephone Encounter (Signed)
Spoke with patient, reason for call was to clarify Losartan dose. Incoming fax received from [facility] on [address] for Losartan 50 mg.  Medication list has Losartan at 25 mg and 50 mg.  Per patient he is taking a 50 mg tablet.   Patient has pending appointment on 05/24/2020

## 2020-05-11 NOTE — Telephone Encounter (Signed)
Patient called and states that he's been trying to receive a refill on his medication "Atorvastatin 20mg ". Patient had labs done in November. I advised patient that he was suppose to follow up in 4 weeks for BP in November of 2020. Patient had missed past appointments with you and I advised patient to schedule an appointment with you soon. Patient has upcoming appointment now on 05/24/2020 at 3:45pm. Medication pend and sent to provider. Please Advise.

## 2020-05-17 NOTE — Telephone Encounter (Signed)
RX was sent on 05/11/2020 by Janett Billow

## 2020-05-19 ENCOUNTER — Other Ambulatory Visit: Payer: Self-pay | Admitting: Nurse Practitioner

## 2020-05-19 DIAGNOSIS — N529 Male erectile dysfunction, unspecified: Secondary | ICD-10-CM

## 2020-05-20 ENCOUNTER — Ambulatory Visit: Payer: BLUE CROSS/BLUE SHIELD | Attending: Orthopaedic Surgery | Admitting: Physical Therapy

## 2020-05-20 ENCOUNTER — Encounter: Payer: Self-pay | Admitting: Physical Therapy

## 2020-05-20 ENCOUNTER — Other Ambulatory Visit: Payer: Self-pay

## 2020-05-20 DIAGNOSIS — R2689 Other abnormalities of gait and mobility: Secondary | ICD-10-CM | POA: Diagnosis not present

## 2020-05-20 DIAGNOSIS — M6281 Muscle weakness (generalized): Secondary | ICD-10-CM | POA: Insufficient documentation

## 2020-05-20 DIAGNOSIS — M25561 Pain in right knee: Secondary | ICD-10-CM | POA: Insufficient documentation

## 2020-05-20 DIAGNOSIS — R600 Localized edema: Secondary | ICD-10-CM

## 2020-05-20 DIAGNOSIS — G8929 Other chronic pain: Secondary | ICD-10-CM | POA: Diagnosis present

## 2020-05-20 NOTE — Therapy (Signed)
[place], [place], [address] Phone: [phone]   Fax:  [phone]  Physical Therapy Treatment  Patient Details  Name: Jonathan Taylor MRN: [id number] Date of Birth: 1986/03/27 Referring Provider (PT): : Douglass Rivers, MD    Encounter Date: 05/20/2020   PT End of Session - 05/20/20 1023    Visit Number 8    Number of Visits 17    Date for PT Re-Evaluation 06/24/20    Authorization Type [employer]    PT Start Time 7322   pt arrived 8 min late   PT Stop Time 1111    PT Time Calculation (min) 48 min    Activity Tolerance Patient limited by pain;Patient tolerated treatment well    Behavior During Therapy [facility] for tasks assessed/performed           Past Medical History:  Diagnosis Date  . Blood in stool   . Diabetes mellitus without complication ([place])   . GERD (gastroesophageal reflux disease)   . History of anal fissures   . Low sperm motility     Past Surgical History:  Procedure Laterality Date  . ANAL FISTULECTOMY  2012   with sphhincterotomy  . ANAL FISTULOTOMY N/A 06/29/2015   Procedure: EXCISION PERI RECTAL SCAR/FISTULA, INTERNAL HEMMORRHOIDAL LIGATION, PEXY, MARSUPIALIZATION;  Surgeon: Michael Boston, MD;  [facility];  Service: General;  Laterality: N/A;  . EXAMINATION UNDER ANESTHESIA N/A 06/29/2015   Procedure: EXAM UNDER ANESTHESIA;  Surgeon: Michael Boston, MD;  [facility];  Service: General;  Laterality: N/A;  . KNEE ARTHROSCOPY W/ MENISCAL REPAIR  08/08/2019  . KNEE SURGERY  07/07/2018  . RECTAL SURGERY  08/2010   Dr Zenia Resides    There were no vitals filed for this visit.   Subjective Assessment - 05/20/20 1023    Subjective "I am still think the tape helps but I ran out at home. I did take meds before comnign so my pain is at a 4/10."    Patient Stated Goals "Walk with one or no crutches"    Currently in Pain? Yes    Pain Score 4    last took pain medication at 7:30              [facility] PT Assessment - 05/20/20 0001      Assessment   Medical Diagnosis Unilateral primary osteoarthritis, right knee M17.11    Referring Provider (PT) : Douglass Rivers, MD                          [facility] Adult PT Treatment/Exercise - 05/20/20 0001      Knee/Hip Exercises: Aerobic   Nustep L6 x 6 min LE only      Knee/Hip Exercises: Seated   Long Arc Quad Strengthening;Right;2 sets;15 reps   3#.     Long [employer] Limitations pt opted to do 30 reps in 1 set but did have to take 2-3 10 second breaks.    Marching 15 reps;2 sets   4# around ankle   Sit to Sand 10 reps;3 sets   from low table with 8 inch step to help elevate table     Knee/Hip Exercises: Sidelying   Hip ABduction 10 reps;3 sets;Right;Strengthening      Moist Heat Therapy   Number Minutes Moist Heat 10 Minutes    Moist Heat Location Knee      Kinesiotix   Create Space Rt lateral tibial plat, and lateral >  med patellar taping    Inhibit Muscle  Rt distal HS and proximal gastroc , I strip wrapping around patella                    PT Short Term Goals - 04/29/20 1027      PT SHORT TERM GOAL #1   Title Patient will be independent with initial HEP    Period Weeks    Status Achieved      PT SHORT TERM GOAL #2   Title Patient will be able to verbalize and demonstrate appropriate posture    Period Weeks    Status Partially Met      PT SHORT TERM GOAL #3   Title Patient will be able to increase knee flexion by >/= 5 degrees in order to allow for improved gait pattern    Period Weeks             PT Long Term Goals - 04/29/20 1100      PT LONG TERM GOAL #1   Title Patient will be able to achieve 0 degrees of knee extension to allow for an improved gait pattern    Period Weeks    Status On-going      PT LONG TERM GOAL #2   Title Patient will be able to achieve >/= 115 degrees of knee flexion    Period Weeks    Status Achieved      PT LONG TERM GOAL #3   Title Patient  will report an max 4/10 pain for >/= 1 week in order to improve quality of life w/ least restrictive assistive device    Period Weeks    Status On-going      PT LONG TERM GOAL #4   Title Patient will weight-bear through both LE equally to minimize stress on Lt. LE    Period Weeks    Status On-going                 Plan - 05/20/20 1037    Clinical Impression Statement pt notes decreased pain today rated at 4/10 but states he did take pain medication earlier today. continued KT taping to promote inhibtion of the quad to calm down pain, and focused session on continued hip/ knee loading/ strengthening to promote stability. He does fatigue quickly and requires intermittent verbal cues for proper form and motivation. He was able to complete all exercises today but did report soreness end of session utilizing MHP end of session to calm down soreness.    PT Treatment/Interventions ADLs/Self Care Home Management;Cryotherapy;Electrical Stimulation;Iontophoresis 56m/ml Dexamethasone;Moist Heat;Ultrasound;Parrafin;Fluidtherapy;Gait training;Stair training;Functional mobility training;Therapeutic activities;Therapeutic exercise;Balance training;Neuromuscular re-education;Patient/family education;Cognitive remediation;Orthotic Fit/Training;Wheelchair mobility training;Manual techniques;Manual lymph drainage;Compression bandaging;Scar mobilization;Passive range of motion;Dry needling;Energy conservation;Taping;Vasopneumatic Device;Vestibular;Joint Manipulations;Visual/perceptual remediation/compensation    PT Next Visit Plan quad work, ROM manaul therapy if it was beneficial long term. gait training, how was McConnel tape, continue loading/ strengthening.    Consulted and Agree with Plan of Care Patient           Patient will benefit from skilled therapeutic intervention in order to improve the following deficits and impairments:  Abnormal gait, Decreased activity tolerance, Decreased balance,  Decreased coordination, Decreased endurance, Decreased strength, Decreased mobility, Decreased range of motion, Difficulty walking, Hypomobility, Increased edema, Increased fascial restricitons, Impaired sensation, Improper body mechanics, Postural dysfunction, Obesity, Pain  Visit Diagnosis: Other abnormalities of gait and mobility  Muscle weakness (generalized)  Chronic pain of right knee  Localized edema  Problem List Patient Active Problem List   Diagnosis Date Noted  . OSA (obstructive sleep apnea) 11/20/2017  . Low testosterone in male 07/11/2017  . Snoring 07/11/2017  . Chest pain with low risk for cardiac etiology 04/29/2017  . Erectile dysfunction associated with type 2 diabetes mellitus ([place]) 10/02/2016  . Essential hypertension 10/02/2016  . Panic attacks 12/02/2015  . Sleep disturbance 10/07/2015  . Possible exposure to STD 10/07/2015  . Diabetes mellitus type 2 in obese ([place]) 03/14/2015  . Male fertility problem 03/14/2015  . Obesity, morbid, BMI 40.0-49.9 ([place]) 11/17/2014  . Anal fistula 09/12/2011   Starr Lake PT, DPT, LAT, ATC  05/20/20  10:59 AM      [facility] [address] [place], [place], [address] Phone: [phone]   Fax:  [phone]  Name: Jonathan Taylor MRN: [id number] Date of Birth: Aug 15, 1986

## 2020-05-24 ENCOUNTER — Ambulatory Visit (INDEPENDENT_AMBULATORY_CARE_PROVIDER_SITE_OTHER): Payer: Self-pay | Admitting: Adult Health

## 2020-05-24 ENCOUNTER — Other Ambulatory Visit: Payer: Self-pay

## 2020-05-24 ENCOUNTER — Encounter: Payer: Self-pay | Admitting: Adult Health

## 2020-05-24 VITALS — BP 142/86 | HR 81 | Temp 97.1°F | Ht 77.0 in | Wt 352.6 lb

## 2020-05-24 DIAGNOSIS — G8929 Other chronic pain: Secondary | ICD-10-CM

## 2020-05-24 DIAGNOSIS — F419 Anxiety disorder, unspecified: Secondary | ICD-10-CM

## 2020-05-24 DIAGNOSIS — I10 Essential (primary) hypertension: Secondary | ICD-10-CM

## 2020-05-24 DIAGNOSIS — M25561 Pain in right knee: Secondary | ICD-10-CM

## 2020-05-24 MED ORDER — HYDROCODONE-ACETAMINOPHEN 10-325 MG PO TABS: 1.0000 | ORAL_TABLET | Freq: Three times a day (TID) | ORAL | 0 refills | Status: DC | PRN

## 2020-05-24 MED ORDER — ALPRAZOLAM 1 MG PO TABS: 1.0000 mg | ORAL_TABLET | Freq: Every evening | ORAL | 0 refills | Status: DC | PRN

## 2020-05-24 NOTE — Progress Notes (Signed)
Location:    Therapist, nutritional of Service:     [facility]  Provider:  Lashe Oliveira Taylor - DNP, MSN, FNP-BC  Jonathan Chandler, NP  Patient Care Team: Jonathan Chandler, NP as PCP - General (Geriatric Medicine) Jonathan Montane, MD as Consulting Physician (Otolaryngology)  Extended Emergency Contact Information Primary Emergency Contact: Jonathan Taylor Address: [place], [place] [address] Jonathan Taylor of [place] Phone: [phone] Work Phone: [phone] Relation: Spouse Secondary Emergency Contact: Jonathan Taylor,Jonathan Taylor Address: [place], [place] [address] [place] Phone: [phone] Relation: Grandmother Mother: Jonathan Taylor [place] of [place] Phone: [phone]  Code Status:  Full Code  Goals of care: Advanced Directive information Advanced Directives 03/05/2020  Does Patient Have a Medical Advance Directive? No  Would patient like information on creating a medical advance directive? No - Patient declined     Chief Complaint  Patient presents with  . Acute Visit    Patient comes in for blood pressure check, but is also requesting medication for panic attacks/anxiety.     HPI:  Pt is a 34 y.o. male seen today for an acute visit for blood pressure check and requesting medication for panic attack.Last 08/08/19, he had knee surgery, S/P right knee arthroscopy with chondroplasty and DFO osteotomy. He stated that he only started walking 3 months ago. He started getting anxious and noted that he was breathing fast at times. Whenever he feels anxious, he smokes cigarette or do deep breathing. He has a wife and a 60 year old daughter. At present, he has been going through a fertility clinic and currently takes Arimidex and Clomid. 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 He does not work at present and was deemed to be disabled. He panics whenever he thinks about being  disabled. He said that his aunt gave him a Xanax 2 mg one time and he was able to sleep. Ever since he had the surgery, he has been"angry". He denies thoughts of hurting himself. His wife is threatening to leave him if he does not do something about his anxiety. He is requesting referral to a mental health provider. He used to take Norco 10-325 mg every 8 hours PRN for pain. His knees has been painful and requesting prescription for Norco. Today's BP is 142/86. He denies headaches. He takes Losartan 50 mg daily for hypertension.   Past Medical History:  Diagnosis Date  . Blood in stool   . Diabetes mellitus without complication ([place])   . GERD (gastroesophageal reflux disease)   . History of anal fissures   . Low sperm motility    Past Surgical History:  Procedure Laterality Date  . ANAL FISTULECTOMY  2012   with sphhincterotomy  . ANAL FISTULOTOMY N/A 06/29/2015   Procedure: EXCISION PERI RECTAL SCAR/FISTULA, INTERNAL HEMMORRHOIDAL LIGATION, PEXY, MARSUPIALIZATION;  Surgeon: Jonathan Boston, MD;  Location: [facility];  Service: General;  Laterality: N/A;  . EXAMINATION UNDER ANESTHESIA N/A 06/29/2015   Procedure: EXAM UNDER ANESTHESIA;  Surgeon: Jonathan Boston, MD;  Location: [facility];  Service: General;  Laterality: N/A;  . KNEE ARTHROSCOPY W/ MENISCAL REPAIR  08/08/2019  . KNEE SURGERY  07/07/2018  . RECTAL SURGERY  08/2010   Dr Jonathan Taylor    Allergies  Allergen Reactions  . Mushroom Extract Complex Anaphylaxis and Swelling    Lips  . Shrimp [employer]  Allergy] Anaphylaxis  . Tomato Anaphylaxis  . Tylenol [Acetaminophen] Swelling    Outpatient Encounter Medications as of 05/24/2020  Medication Sig  . anastrozole (ARIMIDEX) 1 MG tablet anastrozole 1 mg tablet  TAKE 1 TABLET BY MOUTH EVERY DAY  . aspirin 81 MG EC tablet aspirin 81 mg tablet,delayed release  TAKE 1 TABLET (81 MG TOTAL) BY MOUTH 2 TIMES DAILY FOR 14 DAYS.  Marland Kitchen atorvastatin (LIPITOR) 20 MG tablet Take 1 tablet (20 mg total) by mouth  daily. TO KEEP APPT FOR FUTURE REFILLS  . clomiPHENE (CLOMID) 50 MG tablet clomiphene citrate 50 mg tablet  TAKE 1 TABLET BY MOUTH EVERY DAY  . cyclobenzaprine (FEXMID) 7.5 MG tablet Take 7.5 mg by mouth 3 (three) times daily as needed.  Marland Kitchen ibuprofen (ADVIL) 800 MG tablet Take 1 tablet (800 mg total) by mouth 3 (three) times daily.  Marland Kitchen losartan (COZAAR) 50 MG tablet Take 1 tablet (50 mg total) by mouth daily.  . nicotine (NICODERM CQ - DOSED IN MG/24 HOURS) 14 mg/24hr patch nicotine 14 mg/24 hr daily transdermal patch  APPLY 1 PATCH ONTO THE SKIN EVERY DAY  . nicotine polacrilex (COMMIT) 2 MG lozenge nicotine (polacrilex) 2 mg buccal mini lozenge  PLACE 2 MG INSIDE CHEEK DAILY AS NEEDED FOR UP TO 30 DAYS.  Marland Kitchen sildenafil (VIAGRA) 100 MG tablet TAKE 1 TABLET BY MOUTH EVERY DAY AS NEEDED FOR ERECTILE DYSFUNCTION  . ALPRAZolam (XANAX) 1 MG tablet Take 1 tablet (1 mg total) by mouth at bedtime as needed for anxiety.  Marland Kitchen HYDROcodone-acetaminophen (NORCO) 10-325 MG tablet Take 1 tablet by mouth every 8 (eight) hours as needed.  . [DISCONTINUED] albuterol (PROVENTIL HFA;VENTOLIN HFA) 108 (90 Base) MCG/ACT inhaler Inhale 1-2 puffs into the lungs every 6 (six) hours as needed for wheezing or shortness of breath.  . [DISCONTINUED] dapagliflozin propanediol (FARXIGA) 5 MG TABS tablet Take 5 mg by mouth daily.  . [DISCONTINUED] dapagliflozin propanediol (FARXIGA) 5 MG TABS tablet Take by mouth.  . [DISCONTINUED] Docusate Sodium (DSS) 100 MG CAPS docusate sodium 100 mg capsule  TAKE 1 CAPSULE BY MOUTH TWICE A DAY  . [DISCONTINUED] gabapentin (NEURONTIN) 300 MG capsule gabapentin 300 mg capsule  1CAP AT BEDTIME X2 DAYS, THEN TWICE DAILY X2 DAYS THEN 3 TIMES DAILY AS NEEDED  . [DISCONTINUED] HYDROcodone-acetaminophen (NORCO) 10-325 MG tablet hydrocodone 10 mg-acetaminophen 325 mg tablet  TAKE 1 TABLET BY MOUTH EVERY 8 HOURS AS NEEDED FOR UP TO 7 DAYS FOR PAIN  . [DISCONTINUED] methocarbamol (ROBAXIN) 500 MG tablet  Take 1 tablet (500 mg total) by mouth 2 (two) times daily. (Patient not taking: Reported on 03/05/2020)  . [DISCONTINUED] methylPREDNISolone (MEDROL) 4 MG tablet methylprednisolone 4 mg tablets in a dose pack  TAKE AS DIRECTED  . [DISCONTINUED] ondansetron (ZOFRAN) 4 MG tablet ondansetron HCl 4 mg tablet  TAKE 1 TABLET (4 MG TOTAL) BY MOUTH EVERY 8 (EIGHT) HOURS AS NEEDED FOR NAUSEA / VOMITING.  . [DISCONTINUED] terbinafine (LAMISIL) 250 MG tablet terbinafine HCl 250 mg tablet  TAKE 1 TABLET BY MOUTH EVERY DAY  . [DISCONTINUED] traMADol (ULTRAM) 50 MG tablet tramadol 50 mg tablet  TAKE 1 TABLET (50 MG TOTAL) BY MOUTH EVERY 6 (SIX) HOURS AS NEEDED FOR UP TO 5 DAYS.   No facility-administered encounter medications on file as of 05/24/2020.    Review of Systems  Constitutional: Negative for activity change, appetite change and fever.  HENT: Negative for congestion.   Eyes: Negative for pain and itching.  Respiratory: Negative for  cough and shortness of breath.   Cardiovascular: Negative for chest pain and leg swelling.  Gastrointestinal: Negative for abdominal distention, diarrhea and nausea.  Endocrine: Positive for polyuria.  Genitourinary: Negative for dysuria.  Musculoskeletal: Negative for back pain, gait problem and joint swelling.  Neurological: Positive for headaches. Negative for dizziness and weakness.       Occasional headaches  Psychiatric/Behavioral:       Depression, panic attack, anxiety    Immunization History  Administered Date(s) Administered  . Influenza,inj,Quad PF,6+ Mos 11/16/2014  . Pneumococcal Polysaccharide-23 09/12/2016  . Tdap 11/16/2014   Pertinent  Health Maintenance Due  Topic Date Due  . OPHTHALMOLOGY EXAM  12/31/2018  . FOOT EXAM  07/30/2019  . HEMOGLOBIN A1C  03/28/2020  . INFLUENZA VACCINE  06/06/2020   Fall Risk  05/24/2020 09/29/2019 05/16/2019 01/21/2019 09/04/2018  Falls in the past year? 0 0 0 1 Yes  Number falls in past yr: - 0 0 1 2 or  more  Comment - - - - Left leg gave out after knee surgery on 07/15/18  Injury with Fall? - 0 0 0 No  Risk for fall due to : - - - History of fall(s);Impaired mobility -   Functional Status Survey:    Vitals:   05/24/20 1534  BP: (!) 142/86  Pulse: 81  Temp: (!) 97.1 F (36.2 C)  TempSrc: Temporal  SpO2: 98%  Weight: (!) 352 lb 9.6 oz (159.9 kg)  Height: 6\' 5"  (1.956 m)   Body mass index is 41.81 kg/m. Physical Exam Constitutional:      Appearance: Normal appearance. He is obese.     Comments: Morbidly obese  HENT:     Head: Normocephalic.     Nose: No congestion.  Eyes:     Conjunctiva/sclera: Conjunctivae normal.  Cardiovascular:     Rate and Rhythm: Normal rate and regular rhythm.  Pulmonary:     Effort: Pulmonary effort is normal. No respiratory distress.     Breath sounds: Normal breath sounds. No wheezing.  Abdominal:     General: Bowel sounds are normal.  Musculoskeletal:        General: No deformity. Normal range of motion.     Cervical back: Normal range of motion.  Skin:    General: Skin is warm and dry.  Neurological:     Mental Status: He is alert and oriented to person, place, and time.  Psychiatric:        Thought Content: Thought content normal.     Comments: Anxious. Noted to be on his phone and fidgeting with a rubber band all throughout the clinic stay.       Labs reviewed: Recent Labs    06/27/19 1352 09/29/19 1339  NA 140 140  K 4.0 3.8  CL 104 106  CO2 26 24  GLUCOSE 131 145*  BUN 12 10  CREATININE 1.05 0.97  CALCIUM 9.7 9.7   Recent Labs    06/27/19 1352 09/29/19 1339  AST 19 16  ALT 22 22  BILITOT 0.4 0.4  PROT 6.5 6.5   Recent Labs    06/27/19 1352  WBC 7.5  NEUTROABS 4,470  HGB 14.5  HCT 43.3  MCV 78.9*  PLT 237   Lab Results  Component Value Date   TSH 1.54 08/21/2017   Lab Results  Component Value Date   HGBA1C 6.3 (H) 09/29/2019   Lab Results  Component Value Date   CHOL 112 09/29/2019   HDL 45  09/29/2019  LDLCALC 55 09/29/2019   TRIG 43 09/29/2019   CHOLHDL 2.5 09/29/2019     Assessment/Plan  1. Anxiety -  will start on Xanax 1 mg at bedtime PRN - ALPRAZolam (XANAX) 1 MG tablet; Take 1 tablet (1 mg total) by mouth at bedtime as needed for anxiety.  Dispense: 30 tablet; Refill: 0 - Ambulatory referral to Psychology  2. Chronic pain of right knee - follow up with orthopedics - HYDROcodone-acetaminophen (NORCO) 10-325 MG tablet; Take 1 tablet by mouth every 8 (eight) hours as needed.  Dispense: 15 tablet; Refill: 0 -Continue cyclobenzaprine 7.5 mg 1 tab orally TID PRN for muscle spasm  3. Essential hypertension - stable, will continue Losartan 50 mg daily   Family/ staff Communication: Discussed plan of care with patient.  Labs/tests ordered: None   Dru Laurel Taylor - DNP, MSN, FNP-BC [phone]   - 8AM to 5PM, Monday to Friday [phone]    -  After Hours [facility]

## 2020-05-24 NOTE — Patient Instructions (Signed)

## 2020-05-25 ENCOUNTER — Ambulatory Visit: Payer: BLUE CROSS/BLUE SHIELD | Admitting: Physical Therapy

## 2020-05-25 ENCOUNTER — Encounter: Payer: Self-pay | Admitting: Physical Therapy

## 2020-05-25 DIAGNOSIS — R600 Localized edema: Secondary | ICD-10-CM

## 2020-05-25 DIAGNOSIS — R2689 Other abnormalities of gait and mobility: Secondary | ICD-10-CM

## 2020-05-25 DIAGNOSIS — M6281 Muscle weakness (generalized): Secondary | ICD-10-CM

## 2020-05-25 NOTE — Therapy (Signed)
Jonathan Taylor, [place], [address] Phone: [phone]   Fax:  [phone]  Physical Therapy Treatment  Patient Details  Name: Jonathan Taylor MRN: [id number] Date of Birth: 10/14/1986 Referring Provider (PT): : Douglass Rivers, MD    Encounter Date: 05/25/2020   PT End of Session - 05/25/20 1021    Visit Number 9    Number of Visits 17    Date for PT Re-Evaluation 06/24/20    Authorization Type [employer]    PT Start Time 0093   pt arrived 5 min late   PT Stop Time 1114    PT Time Calculation (min) 53 min           Past Medical History:  Diagnosis Date  . Blood in stool   . Diabetes mellitus without complication ([place])   . GERD (gastroesophageal reflux disease)   . History of anal fissures   . Low sperm motility     Past Surgical History:  Procedure Laterality Date  . ANAL FISTULECTOMY  2012   with sphhincterotomy  . ANAL FISTULOTOMY N/A 06/29/2015   Procedure: EXCISION PERI RECTAL SCAR/FISTULA, INTERNAL HEMMORRHOIDAL LIGATION, PEXY, MARSUPIALIZATION;  Surgeon: Michael Boston, MD;  [facility];  Service: General;  Laterality: N/A;  . EXAMINATION UNDER ANESTHESIA N/A 06/29/2015   Procedure: EXAM UNDER ANESTHESIA;  Surgeon: Michael Boston, MD;  [facility]: [facility];  Service: General;  Laterality: N/A;  . KNEE ARTHROSCOPY W/ MENISCAL REPAIR  08/08/2019  . KNEE SURGERY  07/07/2018  . RECTAL SURGERY  08/2010   Dr Zenia Resides    There were no vitals filed for this visit.   Subjective Assessment - 05/25/20 1021    Subjective "I was sore after the last session but it wasn't bad."    Currently in Pain? Yes              [facility] PT Assessment - 05/25/20 0001      Assessment   Medical Diagnosis Unilateral primary osteoarthritis, right knee M17.11    Referring Provider (PT) : Douglass Rivers, MD                          [facility] Adult PT Treatment/Exercise - 05/25/20 0001      Knee/Hip Exercises:  Aerobic   Nustep L6 x 7 min LE only    maintaining MET between 1.5 - 2.0     Knee/Hip Exercises: Standing   Hip Abduction 3 sets;15 reps;Stengthening;Both    Hip Extension 3 sets;15 reps;Both;Stengthening      Knee/Hip Exercises: Seated   Long Arc Quad Strengthening;Right;15 reps;3 sets    Long Arc Quad Weight 3 lbs.    Sit to [employer] 10 reps;3 sets      Knee/Hip Exercises: Supine   Quad Sets --   using 6 inch step from table to promote elevation     Moist Heat Therapy   Number Minutes Moist Heat 10 Minutes    Moist Heat Location Knee      Kinesiotix   Create Space Rt lateral tibial plat, and lateral > med patellar taping    Inhibit Muscle  Rt distal HS and proximal gastroc , I strip wrapping around patella                    PT Short Term Goals - 04/29/20 1027      PT SHORT TERM GOAL #1   Title Patient will be independent with initial  HEP    Period Weeks    Status Achieved      PT SHORT TERM GOAL #2   Title Patient will be able to verbalize and demonstrate appropriate posture    Period Weeks    Status Partially Met      PT SHORT TERM GOAL #3   Title Patient will be able to increase knee flexion by >/= 5 degrees in order to allow for improved gait pattern    Period Weeks             PT Long Term Goals - 04/29/20 1100      PT LONG TERM GOAL #1   Title Patient will be able to achieve 0 degrees of knee extension to allow for an improved gait pattern    Period Weeks    Status On-going      PT LONG TERM GOAL #2   Title Patient will be able to achieve >/= 115 degrees of knee flexion    Period Weeks    Status Achieved      PT LONG TERM GOAL #3   Title Patient will report an max 4/10 pain for >/= 1 week in order to improve quality of life w/ least restrictive assistive device    Period Weeks    Status On-going      PT LONG TERM GOAL #4   Title Patient will weight-bear through both LE equally to minimize stress on Lt. LE    Period Weeks    Status  On-going                 Plan - 05/25/20 1124    Clinical Impression Statement Continued working on loading of the hip and knee. He was able to complete all exercises but did requiring cues for motivation. He reported no increase in pain during or following session continued MHP end of session.    PT Treatment/Interventions ADLs/Self Care Home Management;Cryotherapy;Electrical Stimulation;Iontophoresis 13m/ml Dexamethasone;Moist Heat;Ultrasound;Parrafin;Fluidtherapy;Gait training;Stair training;Functional mobility training;Therapeutic activities;Therapeutic exercise;Balance training;Neuromuscular re-education;Patient/family education;Cognitive remediation;Orthotic Fit/Training;Wheelchair mobility training;Manual techniques;Manual lymph drainage;Compression bandaging;Scar mobilization;Passive range of motion;Dry needling;Energy conservation;Taping;Vasopneumatic Device;Vestibular;Joint Manipulations;Visual/perceptual remediation/compensation    PT Next Visit Plan quad work, ROM manaul therapy if it was beneficial long term. gait training, how was McConnel tape, continue loading/ strengthening.    Consulted and Agree with Plan of Care Patient           Patient will benefit from skilled therapeutic intervention in order to improve the following deficits and impairments:  Abnormal gait, Decreased activity tolerance, Decreased balance, Decreased coordination, Decreased endurance, Decreased strength, Decreased mobility, Decreased range of motion, Difficulty walking, Hypomobility, Increased edema, Increased fascial restricitons, Impaired sensation, Improper body mechanics, Postural dysfunction, Obesity, Pain  Visit Diagnosis: Other abnormalities of gait and mobility  Muscle weakness (generalized)  Localized edema     Problem List Patient Active Problem List   Diagnosis Date Noted  . OSA (obstructive sleep apnea) 11/20/2017  . Low testosterone in male 07/11/2017  . Snoring 07/11/2017  .  Chest pain with low risk for cardiac etiology 04/29/2017  . Erectile dysfunction associated with type 2 diabetes mellitus ([place] 10/02/2016  . Essential hypertension 10/02/2016  . Panic attacks 12/02/2015  . Sleep disturbance 10/07/2015  . Possible exposure to STD 10/07/2015  . Diabetes mellitus type 2 in obese ([place] 03/14/2015  . Male fertility problem 03/14/2015  . Obesity, morbid, BMI 40.0-49.9 ([place] 11/17/2014  . Anal fistula 09/12/2011    KStarr LakePT, DPT, LAT, ATC  05/25/20  11:31 AM      [place], [place], [address] Phone: [phone]   Fax:  [phone]  Name: Jonathan Taylor MRN: [id number] Date of Birth: 1986-04-14

## 2020-05-27 ENCOUNTER — Ambulatory Visit: Payer: BLUE CROSS/BLUE SHIELD | Admitting: Psychology

## 2020-05-27 ENCOUNTER — Ambulatory Visit: Payer: BLUE CROSS/BLUE SHIELD | Admitting: Physical Therapy

## 2020-05-31 ENCOUNTER — Ambulatory Visit (INDEPENDENT_AMBULATORY_CARE_PROVIDER_SITE_OTHER): Payer: BLUE CROSS/BLUE SHIELD | Admitting: Psychology

## 2020-05-31 DIAGNOSIS — F410 Panic disorder [episodic paroxysmal anxiety] without agoraphobia: Secondary | ICD-10-CM

## 2020-06-01 ENCOUNTER — Encounter: Payer: Self-pay | Admitting: Physical Therapy

## 2020-06-01 ENCOUNTER — Ambulatory Visit: Payer: BLUE CROSS/BLUE SHIELD | Admitting: Physical Therapy

## 2020-06-01 ENCOUNTER — Other Ambulatory Visit: Payer: Self-pay

## 2020-06-01 DIAGNOSIS — M6281 Muscle weakness (generalized): Secondary | ICD-10-CM

## 2020-06-01 DIAGNOSIS — R2689 Other abnormalities of gait and mobility: Secondary | ICD-10-CM

## 2020-06-01 DIAGNOSIS — R600 Localized edema: Secondary | ICD-10-CM

## 2020-06-01 DIAGNOSIS — G8929 Other chronic pain: Secondary | ICD-10-CM

## 2020-06-01 NOTE — Therapy (Signed)
[place], [place], [address] Phone: [phone]   Fax:  [phone]  Physical Therapy Treatment  Patient Details  Name: Jonathan Taylor MRN: [id number] Date of Birth: [birth year] Referring Provider (PT): : Douglass Rivers, MD    Encounter Date: 06/01/2020   PT End of Session - 06/01/20 1024    Visit Number 10    Number of Visits 17    Date for PT Re-Evaluation 06/24/20    Authorization Type [employer]    PT Start Time 1021   pt arrived 6 min late   PT Stop Time 1114    PT Time Calculation (min) 53 min    Activity Tolerance Patient limited by pain;Patient tolerated treatment well    Behavior During Therapy [facility] for tasks assessed/performed           Past Medical History:  Diagnosis Date  . Blood in stool   . Diabetes mellitus without complication ([place])   . GERD (gastroesophageal reflux disease)   . History of anal fissures   . Low sperm motility     Past Surgical History:  Procedure Laterality Date  . ANAL FISTULECTOMY  2012   with sphhincterotomy  . ANAL FISTULOTOMY N/A 06/29/2015   Procedure: EXCISION PERI RECTAL SCAR/FISTULA, INTERNAL HEMMORRHOIDAL LIGATION, PEXY, MARSUPIALIZATION;  Surgeon: Michael Boston, MD;  [facility];  Service: General;  Laterality: N/A;  . EXAMINATION UNDER ANESTHESIA N/A 06/29/2015   Procedure: EXAM UNDER ANESTHESIA;  Surgeon: Michael Boston, MD;  [facility];  Service: General;  Laterality: N/A;  . KNEE ARTHROSCOPY W/ MENISCAL REPAIR  08/08/2019  . KNEE SURGERY  07/07/2018  . RECTAL SURGERY  08/2010   Dr Zenia Resides    There were no vitals filed for this visit.   Subjective Assessment - 06/01/20 1024    Subjective " I am having a bad day today, that spot in the outside of the knee is giving more issues today."    Currently in Pain? Yes    Pain Score 7     Pain Location Knee    Pain Orientation Right    Pain Descriptors / Indicators Aching;Sore    Pain Type Chronic  pain    Pain Onset More than a month ago    Pain Frequency Intermittent    Aggravating Factors  walking/ standing    Pain Relieving Factors medication              [facility] PT Assessment - 06/01/20 0001      Assessment   Medical Diagnosis Unilateral primary osteoarthritis, right knee M17.11    Referring Provider (PT) : Douglass Rivers, MD                          [facility] Adult PT Treatment/Exercise - 06/01/20 0001      Knee/Hip Exercises: Aerobic   Nustep L7 x 6 min LE only       Knee/Hip Exercises: Machines for Strengthening   Total Gym Leg Press 2 x 20 40#, 1 x 20 20# bil LE      Knee/Hip Exercises: Standing   Other Standing Knee Exercises toe taps with RLE/LLE on 6 inch step 2 x 20 ea   using bil HHA for safety     Knee/Hip Exercises: Supine   Straight Leg Raises Strengthening;Right;2 sets;15 reps      Knee/Hip Exercises: Sidelying   Hip ABduction 1 set;20 reps;Right;Strengthening      Moist Heat  Therapy   Number Minutes Moist Heat 15 Minutes    Moist Heat Location Knee      Kinesiotix   Create Space Rt lateral tibial plat, and lateral > med patellar taping    Inhibit Muscle  Rt distal HS and proximal gastroc , I strip wrapping around patella                  PT Education - 06/01/20 1257    Education Details Reviewed importance of continued challenging of exercises with increaesd reps/ sets for strengthening.    Person(s) Educated Patient    Methods Explanation;Verbal cues    Comprehension Verbalized understanding;Verbal cues required            PT Short Term Goals - 04/29/20 1027      PT SHORT TERM GOAL #1   Title Patient will be independent with initial HEP    Period Weeks    Status Achieved      PT SHORT TERM GOAL #2   Title Patient will be able to verbalize and demonstrate appropriate posture    Period Weeks    Status Partially Met      PT SHORT TERM GOAL #3   Title Patient will be able to increase knee flexion by >/= 5  degrees in order to allow for improved gait pattern    Period Weeks             PT Long Term Goals - 04/29/20 1100      PT LONG TERM GOAL #1   Title Patient will be able to achieve 0 degrees of knee extension to allow for an improved gait pattern    Period Weeks    Status On-going      PT LONG TERM GOAL #2   Title Patient will be able to achieve >/= 115 degrees of knee flexion    Period Weeks    Status Achieved      PT LONG TERM GOAL #3   Title Patient will report an max 4/10 pain for >/= 1 week in order to improve quality of life w/ least restrictive assistive device    Period Weeks    Status On-going      PT LONG TERM GOAL #4   Title Patient will weight-bear through both LE equally to minimize stress on Lt. LE    Period Weeks    Status On-going                 Plan - 06/01/20 1258    Clinical Impression Statement He did well with strengtheing with increased reps/ sets for hip/ knee activation / stabilization. utilized leg press due to pt noting that he was having a "challenging day". He did require intermittinet cues for encouragement but wsa able to complete all exercises given. Discussed if he plans to get the hardward removed from his knee then we will plan to finalize his exercises over the next 2 visits and disharge to HEP,pt agreed.    PT Next Visit Plan quad work, ROM manaul therapy if it was beneficial long term. gait training, how was McConnel tape, continue loading/ strengthening. progress HEP    Consulted and Agree with Plan of Care Patient           Patient will benefit from skilled therapeutic intervention in order to improve the following deficits and impairments:  Abnormal gait, Decreased activity tolerance, Decreased balance, Decreased coordination, Decreased endurance, Decreased strength, Decreased mobility, Decreased range of motion, Difficulty walking,  Hypomobility, Increased edema, Increased fascial restricitons, Impaired sensation, Improper body  mechanics, Postural dysfunction, Obesity, Pain  Visit Diagnosis: Other abnormalities of gait and mobility  Muscle weakness (generalized)  Localized edema  Chronic pain of right knee     Problem List Patient Active Problem List   Diagnosis Date Noted  . OSA (obstructive sleep apnea) 11/20/2017  . Low testosterone in male 07/11/2017  . Snoring 07/11/2017  . Chest pain with low risk for cardiac etiology 04/29/2017  . Erectile dysfunction associated with type 2 diabetes mellitus ([place]) 10/02/2016  . Essential hypertension 10/02/2016  . Panic attacks 12/02/2015  . Sleep disturbance 10/07/2015  . Possible exposure to STD 10/07/2015  . Diabetes mellitus type 2 in obese ([place]) 03/14/2015  . Male fertility problem 03/14/2015  . Obesity, morbid, BMI 40.0-49.9 ([place]) 11/17/2014  . Anal fistula 09/12/2011   Starr Lake PT, DPT, LAT, ATC  06/01/20  1:01 PM      [facility] [address] [place], [place], [address] Phone: [phone]   Fax:  [phone]  Name: Jonathan Taylor MRN: [id number] Date of Birth: 01-29-1986

## 2020-06-03 ENCOUNTER — Ambulatory Visit: Payer: BLUE CROSS/BLUE SHIELD | Admitting: Physical Therapy

## 2020-06-05 ENCOUNTER — Other Ambulatory Visit: Payer: Self-pay | Admitting: Nurse Practitioner

## 2020-06-05 DIAGNOSIS — E785 Hyperlipidemia, unspecified: Secondary | ICD-10-CM

## 2020-06-09 ENCOUNTER — Ambulatory Visit: Payer: BLUE CROSS/BLUE SHIELD | Admitting: Psychology

## 2020-06-14 ENCOUNTER — Ambulatory Visit (INDEPENDENT_AMBULATORY_CARE_PROVIDER_SITE_OTHER): Payer: Self-pay | Admitting: Psychology

## 2020-06-14 ENCOUNTER — Other Ambulatory Visit: Payer: Self-pay

## 2020-06-14 ENCOUNTER — Ambulatory Visit: Payer: Managed Care, Other (non HMO) | Attending: Orthopaedic Surgery | Admitting: Physical Therapy

## 2020-06-14 ENCOUNTER — Encounter: Payer: Self-pay | Admitting: Physical Therapy

## 2020-06-14 DIAGNOSIS — M25561 Pain in right knee: Secondary | ICD-10-CM | POA: Insufficient documentation

## 2020-06-14 DIAGNOSIS — M6281 Muscle weakness (generalized): Secondary | ICD-10-CM | POA: Insufficient documentation

## 2020-06-14 DIAGNOSIS — G8929 Other chronic pain: Secondary | ICD-10-CM | POA: Diagnosis present

## 2020-06-14 DIAGNOSIS — R2689 Other abnormalities of gait and mobility: Secondary | ICD-10-CM | POA: Diagnosis present

## 2020-06-14 DIAGNOSIS — F410 Panic disorder [episodic paroxysmal anxiety] without agoraphobia: Secondary | ICD-10-CM

## 2020-06-14 DIAGNOSIS — R600 Localized edema: Secondary | ICD-10-CM

## 2020-06-14 NOTE — Therapy (Addendum)
[place], [place], [address] Phone: [phone]   Fax:  [phone]  Physical Therapy Treatment / Discharge  Patient Details  Name: Jonathan Taylor MRN: [id number] Date of Birth: [birth year] Referring Provider (PT): : Douglass Rivers, MD    Encounter Date: 06/14/2020   PT End of Session - 06/14/20 1341    Visit Number 11    Number of Visits 17    Date for PT Re-Evaluation 06/24/20    PT Start Time 6295   pt arrived 6 min late   PT Stop Time 1415    PT Time Calculation (min) 38 min    Activity Tolerance Patient limited by pain;Patient tolerated treatment well    Behavior During Therapy [facility] for tasks assessed/performed           Past Medical History:  Diagnosis Date  . Blood in stool   . Diabetes mellitus without complication ([place])   . GERD (gastroesophageal reflux disease)   . History of anal fissures   . Low sperm motility     Past Surgical History:  Procedure Laterality Date  . ANAL FISTULECTOMY  2012   with sphhincterotomy  . ANAL FISTULOTOMY N/A 06/29/2015   Procedure: EXCISION PERI RECTAL SCAR/FISTULA, INTERNAL HEMMORRHOIDAL LIGATION, PEXY, MARSUPIALIZATION;  Surgeon: Michael Boston, MD;  Location: [facility];  Service: General;  Laterality: N/A;  . EXAMINATION UNDER ANESTHESIA N/A 06/29/2015   Procedure: EXAM UNDER ANESTHESIA;  Surgeon: Michael Boston, MD;  Location: [facility];  Service: General;  Laterality: N/A;  . KNEE ARTHROSCOPY W/ MENISCAL REPAIR  08/08/2019  . KNEE SURGERY  07/07/2018  . RECTAL SURGERY  08/2010   Dr Zenia Resides    There were no vitals filed for this visit.   Subjective Assessment - 06/14/20 1340    Subjective " I am doing okay today, I did go to the gym this morning. I got a new drink and it really helps with my pain."    Currently in Pain? Yes    Pain Score 6     Pain Orientation Right    Pain Descriptors / Indicators Aching    Pain Type Chronic pain    Pain Onset More than a  month ago              [facility] PT Assessment - 06/14/20 0001      Assessment   Medical Diagnosis Unilateral primary osteoarthritis, right knee M17.11    Referring Provider (PT) : Douglass Rivers, MD                          [facility] Adult PT Treatment/Exercise - 06/14/20 0001      Knee/Hip Exercises: Aerobic   Nustep L7 x 7 min LE only      Knee/Hip Exercises: Standing   SLS with Vectors SLS performed bil 1 x 20 ea. direction with green theraband    Other Standing Knee Exercises toe taps alternatingRLE/LLE on 8 inch step 2 x 20 ea      Knee/Hip Exercises: Seated   Long Arc Quad Strengthening;Right;15 reps;3 sets    [employer] Weight 3 lbs.      [employer]   Create Space Rt lateral tibial plat, and lateral > med patellar taping    Inhibit Muscle  Rt distal HS and proximal gastroc , I strip wrapping around patella               Balance Exercises - 06/14/20  0001      Balance Exercises: Standing   Standing Eyes Opened Narrow base of support (BOS);4 reps;30 secs   in //   Standing Eyes Closed Narrow base of support (BOS);3 reps;30 secs   in //              PT Short Term Goals - 04/29/20 1027      PT SHORT TERM GOAL #1   Title Patient will be independent with initial HEP    Period Weeks    Status Achieved      PT SHORT TERM GOAL #2   Title Patient will be able to verbalize and demonstrate appropriate posture    Period Weeks    Status Partially Met      PT SHORT TERM GOAL #3   Title Patient will be able to increase knee flexion by >/= 5 degrees in order to allow for improved gait pattern    Period Weeks             PT Long Term Goals - 04/29/20 1100      PT LONG TERM GOAL #1   Title Patient will be able to achieve 0 degrees of knee extension to allow for an improved gait pattern    Period Weeks    Status On-going      PT LONG TERM GOAL #2   Title Patient will be able to achieve >/= 115 degrees of knee flexion    Period Weeks     Status Achieved      PT LONG TERM GOAL #3   Title Patient will report an max 4/10 pain for >/= 1 week in order to improve quality of life w/ least restrictive assistive device    Period Weeks    Status On-going      PT LONG TERM GOAL #4   Title Patient will weight-bear through both LE equally to minimize stress on Lt. LE    Period Weeks    Status On-going                 Plan - 06/14/20 1352    Clinical Impression Statement pt reports 6/10 pain coming in today. continued working on hip/ knee strengthening with emphasis on endurance training with increased reps. he did well with all exercises today and reports he went to the gym earlier today. He does continue to fatigue quickly requiring intermittent rest breaks. plan to review HEP final exercises and discharge to HEP.    PT Next Visit Plan quad work, ROM manaul therapy if it was beneficial long term. gait training, how was McConnel tape, continue loading/ strengthening. progress HEP    Consulted and Agree with Plan of Care Patient           Patient will benefit from skilled therapeutic intervention in order to improve the following deficits and impairments:  Abnormal gait, Decreased activity tolerance, Decreased balance, Decreased coordination, Decreased endurance, Decreased strength, Decreased mobility, Decreased range of motion, Difficulty walking, Hypomobility, Increased edema, Increased fascial restricitons, Impaired sensation, Improper body mechanics, Postural dysfunction, Obesity, Pain  Visit Diagnosis: Other abnormalities of gait and mobility  Muscle weakness (generalized)  Localized edema  Chronic pain of right knee     Problem List Patient Active Problem List   Diagnosis Date Noted  . OSA (obstructive sleep apnea) 11/20/2017  . Low testosterone in male 07/11/2017  . Snoring 07/11/2017  . Chest pain with low risk for cardiac etiology 04/29/2017  . Erectile dysfunction associated with type  2 diabetes  mellitus ([place]) 10/02/2016  . Essential hypertension 10/02/2016  . Panic attacks 12/02/2015  . Sleep disturbance 10/07/2015  . Possible exposure to STD 10/07/2015  . Diabetes mellitus type 2 in obese ([place]) 03/14/2015  . Male fertility problem 03/14/2015  . Obesity, morbid, BMI 40.0-49.9 ([place]) 11/17/2014  . Anal fistula 09/12/2011   Starr Lake PT, DPT, LAT, ATC  06/14/20  2:15 PM      [place] [facility] [address] [place], [place], [address] Phone: [phone]   Fax:  [phone]  Name: Maksymilian Mabey MRN: [id number] Date of Birth: August 24, 1986      PHYSICAL THERAPY DISCHARGE SUMMARY  Visits from Start of Care: 11  Current functional level related to goals / functional outcomes: See goals   Remaining deficits: Current status unknown    Education / Equipment: HEP  Plan: Patient agrees to discharge.  Patient goals were not met. Patient is being discharged due to not returning since the last visit.  ?????        Dequarius Jeffries PT, DPT, LAT, ATC  07/07/20  2:49 PM

## 2020-06-21 ENCOUNTER — Ambulatory Visit: Payer: Self-pay | Admitting: Psychology

## 2020-06-21 ENCOUNTER — Ambulatory Visit: Payer: Managed Care, Other (non HMO) | Admitting: Physical Therapy

## 2020-07-06 ENCOUNTER — Ambulatory Visit
Admission: EM | Admit: 2020-07-06 | Discharge: 2020-07-06 | Disposition: A | Discharge status: Home / Self Care | Payer: No Typology Code available for payment source

## 2020-07-06 ENCOUNTER — Emergency Department (HOSPITAL_COMMUNITY): Payer: Managed Care, Other (non HMO)

## 2020-07-06 ENCOUNTER — Other Ambulatory Visit: Payer: Self-pay

## 2020-07-06 ENCOUNTER — Encounter (HOSPITAL_COMMUNITY): Payer: Self-pay

## 2020-07-06 ENCOUNTER — Emergency Department (HOSPITAL_COMMUNITY)
Admission: EM | Admit: 2020-07-06 | Discharge: 2020-07-06 | Disposition: A | Discharge status: Home / Self Care | Payer: Managed Care, Other (non HMO) | Attending: Emergency Medicine | Admitting: Emergency Medicine

## 2020-07-06 DIAGNOSIS — Y939 Activity, unspecified: Secondary | ICD-10-CM | POA: Insufficient documentation

## 2020-07-06 DIAGNOSIS — Y999 Unspecified external cause status: Secondary | ICD-10-CM | POA: Diagnosis not present

## 2020-07-06 DIAGNOSIS — Y929 Unspecified place or not applicable: Secondary | ICD-10-CM | POA: Insufficient documentation

## 2020-07-06 DIAGNOSIS — M25561 Pain in right knee: Secondary | ICD-10-CM | POA: Insufficient documentation

## 2020-07-06 DIAGNOSIS — I10 Essential (primary) hypertension: Secondary | ICD-10-CM | POA: Insufficient documentation

## 2020-07-06 DIAGNOSIS — Z87891 Personal history of nicotine dependence: Secondary | ICD-10-CM | POA: Diagnosis not present

## 2020-07-06 DIAGNOSIS — Z79899 Other long term (current) drug therapy: Secondary | ICD-10-CM | POA: Diagnosis not present

## 2020-07-06 DIAGNOSIS — M542 Cervicalgia: Secondary | ICD-10-CM | POA: Insufficient documentation

## 2020-07-06 DIAGNOSIS — S060X0A Concussion without loss of consciousness, initial encounter: Secondary | ICD-10-CM | POA: Diagnosis present

## 2020-07-06 DIAGNOSIS — Z7982 Long term (current) use of aspirin: Secondary | ICD-10-CM | POA: Diagnosis not present

## 2020-07-06 DIAGNOSIS — E119 Type 2 diabetes mellitus without complications: Secondary | ICD-10-CM | POA: Diagnosis not present

## 2020-07-06 DIAGNOSIS — Z20822 Contact with and (suspected) exposure to covid-19: Secondary | ICD-10-CM

## 2020-07-06 MED ORDER — KETOROLAC TROMETHAMINE 30 MG/ML IJ SOLN
30.0000 mg | Freq: Once | INTRAMUSCULAR | Status: DC
  Filled 2020-07-06: qty 1

## 2020-07-06 MED ORDER — CYCLOBENZAPRINE HCL 10 MG PO TABS: 10.0000 mg | ORAL_TABLET | Freq: Two times a day (BID) | ORAL | 0 refills | Status: DC | PRN

## 2020-07-06 MED ORDER — KETOROLAC TROMETHAMINE 60 MG/2ML IM SOLN
30.0000 mg | Freq: Once | INTRAMUSCULAR | Status: AC
  Administered 2020-07-06: 30 mg via INTRAMUSCULAR
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
## 2020-07-06 NOTE — ED Notes (Addendum)
Visual acuity: both eyes: 20/40, lt eye: 20/50, rt. eye: 20/200. Pt. Stated blurry vision in right eye.

## 2020-07-07 ENCOUNTER — Ambulatory Visit: Payer: 59 | Admitting: Psychology

## 2020-07-07 LAB — NOVEL CORONAVIRUS, NAA: SARS-CoV-2, NAA: NOT DETECTED

## 2020-07-10 ENCOUNTER — Other Ambulatory Visit: Payer: Self-pay | Admitting: Nurse Practitioner

## 2020-07-10 DIAGNOSIS — E785 Hyperlipidemia, unspecified: Secondary | ICD-10-CM

## 2020-07-13 NOTE — Telephone Encounter (Signed)
Patient requesting refill on medication "Atorvastatin". Patient last had medication refilled by Sherrie Mustache, NP on 06/07/2020 with 30 tablets. Is this medication long term? Medication pend and sent to provider Ngetich, Dinah, NP due to [name], NP being out of office. Please Advise.

## 2020-07-21 ENCOUNTER — Ambulatory Visit: Payer: 59 | Admitting: Psychology

## 2020-07-27 ENCOUNTER — Other Ambulatory Visit: Payer: Self-pay | Admitting: Family

## 2020-07-27 DIAGNOSIS — E785 Hyperlipidemia, unspecified: Secondary | ICD-10-CM

## 2020-07-29 ENCOUNTER — Ambulatory Visit: Payer: 59 | Admitting: Psychology

## 2020-08-05 ENCOUNTER — Other Ambulatory Visit: Payer: Self-pay | Admitting: Nurse Practitioner

## 2020-08-05 DIAGNOSIS — I10 Essential (primary) hypertension: Secondary | ICD-10-CM

## 2020-09-13 ENCOUNTER — Other Ambulatory Visit: Payer: Self-pay | Admitting: Family

## 2020-09-13 DIAGNOSIS — E785 Hyperlipidemia, unspecified: Secondary | ICD-10-CM

## 2020-10-13 ENCOUNTER — Other Ambulatory Visit: Payer: Self-pay | Admitting: Nurse Practitioner

## 2020-10-13 DIAGNOSIS — N529 Male erectile dysfunction, unspecified: Secondary | ICD-10-CM

## 2020-10-13 NOTE — Telephone Encounter (Signed)
RX request sent to [name], NP to review and advise when patient is due for a routine visit

## 2020-10-22 ENCOUNTER — Encounter: Payer: Self-pay | Admitting: Nurse Practitioner

## 2020-10-22 ENCOUNTER — Other Ambulatory Visit: Payer: Self-pay

## 2020-10-22 ENCOUNTER — Ambulatory Visit (INDEPENDENT_AMBULATORY_CARE_PROVIDER_SITE_OTHER): Payer: Managed Care, Other (non HMO) | Admitting: Nurse Practitioner

## 2020-10-22 VITALS — BP 140/120 | HR 88 | Temp 97.3°F | Ht 78.0 in | Wt 346.2 lb

## 2020-10-22 DIAGNOSIS — E1169 Type 2 diabetes mellitus with other specified complication: Secondary | ICD-10-CM | POA: Diagnosis not present

## 2020-10-22 DIAGNOSIS — F419 Anxiety disorder, unspecified: Secondary | ICD-10-CM

## 2020-10-22 DIAGNOSIS — E785 Hyperlipidemia, unspecified: Secondary | ICD-10-CM

## 2020-10-22 DIAGNOSIS — M7989 Other specified soft tissue disorders: Secondary | ICD-10-CM

## 2020-10-22 DIAGNOSIS — F17200 Nicotine dependence, unspecified, uncomplicated: Secondary | ICD-10-CM

## 2020-10-22 DIAGNOSIS — F339 Major depressive disorder, recurrent, unspecified: Secondary | ICD-10-CM

## 2020-10-22 DIAGNOSIS — I10 Essential (primary) hypertension: Secondary | ICD-10-CM | POA: Diagnosis not present

## 2020-10-22 DIAGNOSIS — M170 Bilateral primary osteoarthritis of knee: Secondary | ICD-10-CM

## 2020-10-22 DIAGNOSIS — E669 Obesity, unspecified: Secondary | ICD-10-CM

## 2020-10-22 NOTE — Patient Instructions (Signed)
Would recommend anti-anxiety anti-depressant  Look into buspar
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
## 2020-10-26 ENCOUNTER — Telehealth: Payer: Self-pay

## 2020-10-26 ENCOUNTER — Other Ambulatory Visit: Payer: Self-pay

## 2020-10-26 MED ORDER — DAPAGLIFLOZIN PROPANEDIOL 5 MG PO TABS: 5.0000 mg | ORAL_TABLET | Freq: Every day | ORAL | 3 refills | Status: DC

## 2020-10-26 MED ORDER — ATORVASTATIN CALCIUM 40 MG PO TABS
40.0000 mg | ORAL_TABLET | Freq: Every day | ORAL | 3 refills | Status: DC
Start: 2020-10-26 — End: 2021-01-24

## 2020-10-26 NOTE — Telephone Encounter (Signed)
Per Sherrie Mustache  NP increase Lipitor to 40 mg one tab by mouth daily

## 2020-10-28 ENCOUNTER — Other Ambulatory Visit: Payer: Self-pay

## 2020-10-28 DIAGNOSIS — N529 Male erectile dysfunction, unspecified: Secondary | ICD-10-CM

## 2020-10-28 LAB — IRON,TIBC AND FERRITIN PANEL
%SAT: 24 % (calc) (ref 20–48)
Ferritin: 146 ng/mL (ref 38–380)
Iron: 71 ug/dL (ref 50–180)
TIBC: 297 mcg/dL (calc) (ref 250–425)

## 2020-10-28 LAB — CBC WITH DIFFERENTIAL/PLATELET
Absolute Monocytes: 595 cells/uL (ref 200–950)
Basophils Absolute: 28 cells/uL (ref 0–200)
Basophils Relative: 0.4 %
Eosinophils Absolute: 42 cells/uL (ref 15–500)
Eosinophils Relative: 0.6 %
HCT: 41 % (ref 38.5–50.0)
Hemoglobin: 13.1 g/dL — ABNORMAL LOW (ref 13.2–17.1)
Lymphs Abs: 2499 cells/uL (ref 850–3900)
MCH: 25.2 pg — ABNORMAL LOW (ref 27.0–33.0)
MCHC: 32 g/dL (ref 32.0–36.0)
MCV: 78.8 fL — ABNORMAL LOW (ref 80.0–100.0)
MPV: 11.3 fL (ref 7.5–12.5)
Monocytes Relative: 8.5 %
Neutro Abs: 3836 cells/uL (ref 1500–7800)
Neutrophils Relative %: 54.8 %
Platelets: 251 10*3/uL (ref 140–400)
RBC: 5.2 10*6/uL (ref 4.20–5.80)
RDW: 13.2 % (ref 11.0–15.0)
Total Lymphocyte: 35.7 %
WBC: 7 10*3/uL (ref 3.8–10.8)

## 2020-10-28 LAB — COMPLETE METABOLIC PANEL WITH GFR
AG Ratio: 1.6 (calc) (ref 1.0–2.5)
ALT: 26 U/L (ref 9–46)
AST: 26 U/L (ref 10–40)
Albumin: 4.2 g/dL (ref 3.6–5.1)
Alkaline phosphatase (APISO): 64 U/L (ref 36–130)
BUN: 13 mg/dL (ref 7–25)
CO2: 30 mmol/L (ref 20–32)
Calcium: 9.7 mg/dL (ref 8.6–10.3)
Chloride: 105 mmol/L (ref 98–110)
Creat: 1.1 mg/dL (ref 0.60–1.35)
GFR, Est African American: 101 mL/min/{1.73_m2} (ref >=60)
GFR, Est Non African American: 87 mL/min/{1.73_m2} (ref >=60)
Globulin: 2.6 g/dL (calc) (ref 1.9–3.7)
Glucose, Bld: 154 mg/dL — ABNORMAL HIGH (ref 65–99)
Potassium: 4.6 mmol/L (ref 3.5–5.3)
Sodium: 140 mmol/L (ref 135–146)
Total Bilirubin: 0.4 mg/dL (ref 0.2–1.2)
Total Protein: 6.8 g/dL (ref 6.1–8.1)

## 2020-10-28 LAB — TEST AUTHORIZATION

## 2020-10-28 LAB — HEMOGLOBIN A1C
Hgb A1c MFr Bld: 8.3 % of total Hgb — ABNORMAL HIGH (ref <5.7)
Mean Plasma Glucose: 192 mg/dL
eAG (mmol/L): 10.6 mmol/L

## 2020-10-28 LAB — LIPID PANEL
Cholesterol: 156 mg/dL (ref <200)
HDL: 48 mg/dL (ref >=40)
LDL Cholesterol (Calc): 94 mg/dL (calc)
Non-HDL Cholesterol (Calc): 108 mg/dL (calc) (ref <130)
Total CHOL/HDL Ratio: 3.3 (calc) (ref <5.0)
Triglycerides: 58 mg/dL (ref <150)

## 2020-10-28 MED ORDER — SILDENAFIL CITRATE 100 MG PO TABS: 100.0000 mg | ORAL_TABLET | Freq: Every day | ORAL | 0 refills | Status: DC | PRN

## 2020-11-01 ENCOUNTER — Other Ambulatory Visit: Payer: Self-pay

## 2020-11-01 DIAGNOSIS — E785 Hyperlipidemia, unspecified: Secondary | ICD-10-CM

## 2020-11-01 DIAGNOSIS — N529 Male erectile dysfunction, unspecified: Secondary | ICD-10-CM

## 2020-11-01 DIAGNOSIS — E1169 Type 2 diabetes mellitus with other specified complication: Secondary | ICD-10-CM

## 2020-11-01 MED ORDER — SILDENAFIL CITRATE 100 MG PO TABS: 100.0000 mg | ORAL_TABLET | Freq: Every day | ORAL | 3 refills | Status: DC | PRN
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
## 2021-01-19 ENCOUNTER — Other Ambulatory Visit: Payer: Managed Care, Other (non HMO)

## 2021-01-19 ENCOUNTER — Other Ambulatory Visit: Payer: Self-pay

## 2021-01-19 DIAGNOSIS — E1169 Type 2 diabetes mellitus with other specified complication: Secondary | ICD-10-CM

## 2021-01-19 DIAGNOSIS — E785 Hyperlipidemia, unspecified: Secondary | ICD-10-CM

## 2021-01-20 LAB — LIPID PANEL
Cholesterol: 137 mg/dL (ref <200)
HDL: 46 mg/dL (ref >=40)
LDL Cholesterol (Calc): 77 mg/dL (calc)
Non-HDL Cholesterol (Calc): 91 mg/dL (calc) (ref <130)
Total CHOL/HDL Ratio: 3 (calc) (ref <5.0)
Triglycerides: 67 mg/dL (ref <150)

## 2021-01-20 LAB — HEMOGLOBIN A1C
Hgb A1c MFr Bld: 7.6 % of total Hgb — ABNORMAL HIGH (ref <5.7)
Mean Plasma Glucose: 171 mg/dL
eAG (mmol/L): 9.5 mmol/L

## 2021-01-21 ENCOUNTER — Ambulatory Visit: Payer: Managed Care, Other (non HMO) | Admitting: Nurse Practitioner

## 2021-01-24 ENCOUNTER — Other Ambulatory Visit: Payer: Self-pay

## 2021-01-24 ENCOUNTER — Ambulatory Visit (INDEPENDENT_AMBULATORY_CARE_PROVIDER_SITE_OTHER): Payer: Managed Care, Other (non HMO) | Admitting: Nurse Practitioner

## 2021-01-24 ENCOUNTER — Encounter: Payer: Self-pay | Admitting: Nurse Practitioner

## 2021-01-24 VITALS — BP 150/110 | HR 81 | Temp 97.8°F | Ht 78.0 in | Wt 339.6 lb

## 2021-01-24 DIAGNOSIS — I10 Essential (primary) hypertension: Secondary | ICD-10-CM

## 2021-01-24 DIAGNOSIS — E669 Obesity, unspecified: Secondary | ICD-10-CM

## 2021-01-24 DIAGNOSIS — E1169 Type 2 diabetes mellitus with other specified complication: Secondary | ICD-10-CM

## 2021-01-24 DIAGNOSIS — E785 Hyperlipidemia, unspecified: Secondary | ICD-10-CM | POA: Diagnosis not present

## 2021-01-24 DIAGNOSIS — N529 Male erectile dysfunction, unspecified: Secondary | ICD-10-CM | POA: Diagnosis not present

## 2021-01-24 DIAGNOSIS — M170 Bilateral primary osteoarthritis of knee: Secondary | ICD-10-CM

## 2021-01-24 MED ORDER — DAPAGLIFLOZIN PROPANEDIOL 5 MG PO TABS: 5.0000 mg | ORAL_TABLET | Freq: Every day | ORAL | 3 refills | Status: DC

## 2021-01-24 MED ORDER — LOSARTAN POTASSIUM 50 MG PO TABS: 50.0000 mg | ORAL_TABLET | Freq: Every day | ORAL | 0 refills | Status: DC

## 2021-01-24 MED ORDER — ATORVASTATIN CALCIUM 20 MG PO TABS: ORAL_TABLET | ORAL | 3 refills | Status: DC

## 2021-01-24 MED ORDER — SILDENAFIL CITRATE 100 MG PO TABS: 100.0000 mg | ORAL_TABLET | Freq: Every day | ORAL | 3 refills | Status: DC | PRN

## 2021-01-24 NOTE — Patient Instructions (Addendum)
Please sign record release for [facility].   Restart losartan- this helps protect your kidneys due to diabetes  A1c is still elevated- continue farxiga 5 mg - to take this medication daily along with exercise and dietary modification  Will reduce lipitor to 20 mg daily - to take 3 times weekly

## 2021-01-24 NOTE — Progress Notes (Signed)
Careteam: Patient Care Team: [name], NP as PCP - General (Geriatric Medicine) Melissa Montane, MD as Consulting Physician (Otolaryngology)  PLACE OF SERVICE:  [place] Directive information Does Patient Have a Medical Advance Directive?: No, Would patient like information on creating a medical advance directive?: No - Patient declined  Allergies  Allergen Reactions  . Mushroom Extract Complex Anaphylaxis and Swelling    Lips  . Shrimp [Shellfish Allergy] Anaphylaxis  . Tomato Anaphylaxis  . Tylenol [Acetaminophen] Swelling    Chief Complaint  Patient presents with  . Medical Management of Chronic Issues    3 month follow up. Discuss need for COVID vaccine,flu vaccine, eye exam and foot exam.Patient has not been taking blood pressure medications.     HPI: Patient is a 35 y.o. male for routine follow up.   Went to doctor last week and blood pressure was stable.  He smoked prior to coming to appt today and blood pressure was elevated. Watching blood pressure at home  130/50-60 Blood pressure was 139/90.   OA- continues to have pain with bilateral knees- recommending knee replacements but he does not wish to have this done at this time. Sometimes he has a hard time walking due to the pain.   Obesity- has changed diet "all the way around" Doing Intermediating fasting.   DM-  Misses medication occasionally.  Up to date with eye doctor. No neuropathy.   Hyperlipidemia- not taking cholesterol medication- stopped 2 months ago. LDL 77  Declines COVID and flu vaccine.   Review of Systems:  Review of Systems  Constitutional: Negative for chills, fever and weight loss.  HENT: Negative for tinnitus.   Respiratory: Negative for cough, sputum production and shortness of breath.   Cardiovascular: Negative for chest pain, palpitations and leg swelling.  Gastrointestinal: Negative for abdominal pain, constipation, diarrhea and heartburn.  Genitourinary:  Negative for dysuria, frequency and urgency.  Musculoskeletal: Positive for joint pain. Negative for back pain, falls and myalgias.  Skin: Negative.   Neurological: Negative for dizziness and headaches.  Psychiatric/Behavioral: Negative for depression and memory loss. The patient does not have insomnia.     Past Medical History:  Diagnosis Date  . Blood in stool   . Diabetes mellitus without complication ([place])   . GERD (gastroesophageal reflux disease)   . History of anal fissures   . Low sperm motility    Past Surgical History:  Procedure Laterality Date  . ANAL FISTULECTOMY  2012   with sphhincterotomy  . ANAL FISTULOTOMY N/A 06/29/2015   Procedure: EXCISION PERI RECTAL SCAR/FISTULA, INTERNAL HEMMORRHOIDAL LIGATION, PEXY, MARSUPIALIZATION;  Surgeon: Michael Boston, MD;  Location: [facility];  Service: General;  Laterality: N/A;  . EXAMINATION UNDER ANESTHESIA N/A 06/29/2015   Procedure: EXAM UNDER ANESTHESIA;  Surgeon: Michael Boston, MD;  Location: [facility];  Service: General;  Laterality: N/A;  . KNEE ARTHROSCOPY W/ MENISCAL REPAIR  08/08/2019  . KNEE SURGERY  07/07/2018  . RECTAL SURGERY  08/2010   Dr Zenia Resides   Social History:   reports that he has been smoking cigarettes. He has smoked for the past 10.00 years. He has never used smokeless tobacco. He reports current alcohol use. He reports that he does not use drugs.  Family History  Problem Relation Age of Onset  . Diabetes Mother   . Hypertension Mother   . Throat cancer Maternal Aunt   . Cirrhosis Maternal Grandfather   . Colon cancer Neg Hx   . Colon polyps  Neg Hx   . Kidney disease Neg Hx     Medications: Patient's Medications  New Prescriptions   No medications on file  Previous Medications   ANASTROZOLE (ARIMIDEX) 1 MG TABLET    anastrozole 1 mg tablet  TAKE 1 TABLET BY MOUTH EVERY DAY   ASPIRIN 81 MG EC TABLET    aspirin 81 mg tablet,delayed release  TAKE 1 TABLET (81 MG TOTAL) BY MOUTH 2 TIMES DAILY FOR 14 DAYS.    ATORVASTATIN (LIPITOR) 40 MG TABLET    Take 1 tablet (40 mg total) by mouth daily.   CLOMIPHENE (CLOMID) 50 MG TABLET    clomiphene citrate 50 mg tablet  TAKE 1 TABLET BY MOUTH EVERY DAY   CYCLOBENZAPRINE (FLEXERIL) 10 MG TABLET    Take 1 tablet (10 mg total) by mouth 2 (two) times daily as needed for muscle spasms.   DAPAGLIFLOZIN PROPANEDIOL (FARXIGA) 5 MG TABS TABLET    Take 1 tablet (5 mg total) by mouth daily before breakfast.   IBUPROFEN (ADVIL) 800 MG TABLET    Take 1 tablet (800 mg total) by mouth 3 (three) times daily.   LOSARTAN (COZAAR) 50 MG TABLET    TAKE 1 TABLET BY MOUTH EVERY DAY   NICOTINE (NICODERM CQ - DOSED IN MG/24 HOURS) 14 MG/24HR PATCH    nicotine 14 mg/24 hr daily transdermal patch  APPLY 1 PATCH ONTO THE SKIN EVERY DAY   SILDENAFIL (VIAGRA) 100 MG TABLET    Take 1 tablet (100 mg total) by mouth daily as needed for erectile dysfunction. WILL NEED APPT FOR FUTURE REFILLS  Modified Medications   No medications on file  Discontinued Medications   PREDNISOLONE ACETATE (PRED FORTE) 1 % OPHTHALMIC SUSPENSION    Place into the right eye.    Physical Exam:  Vitals:   01/24/21 1315  BP: (!) 150/110  Pulse: 81  Temp: 97.8 F (36.6 C)  SpO2: 97%  Weight: (!) 339 lb 9.6 oz (154 kg)  Height: 6\' 6"  (1.981 m)   Body mass index is 39.24 kg/m. Wt Readings from Last 3 Encounters:  01/24/21 (!) 339 lb 9.6 oz (154 kg)  10/22/20 (!) 346 lb 3.2 oz (157 kg)  07/06/20 (!) 325 lb (147.4 kg)    Physical Exam Constitutional:      General: He is not in acute distress.    Appearance: He is well-developed. He is not diaphoretic.  HENT:     Head: Normocephalic and atraumatic.     Mouth/Throat:     Pharynx: No oropharyngeal exudate.  Eyes:     Conjunctiva/sclera: Conjunctivae normal.     Pupils: Pupils are equal, round, and reactive to light.  Cardiovascular:     Rate and Rhythm: Normal rate and regular rhythm.     Heart sounds: Normal heart sounds.  Pulmonary:     Effort:  Pulmonary effort is normal.     Breath sounds: Normal breath sounds.  Abdominal:     General: Bowel sounds are normal.     Palpations: Abdomen is soft.  Musculoskeletal:     Cervical back: Normal range of motion and neck supple.     Right lower leg: No edema.     Left lower leg: No edema.  Skin:    General: Skin is warm and dry.  Neurological:     Mental Status: He is alert and oriented to person, place, and time. Mental status is at baseline.  Psychiatric:        Mood and  Affect: Mood normal.        Behavior: Behavior normal.     Labs reviewed: Basic Metabolic Panel: Recent Labs    10/22/20 1121  NA 140  K 4.6  CL 105  CO2 30  GLUCOSE 154*  BUN 13  CREATININE 1.10  CALCIUM 9.7   Liver Function Tests: Recent Labs    10/22/20 1121  AST 26  ALT 26  BILITOT 0.4  PROT 6.8   No results for input(s): LIPASE, AMYLASE in the last 8760 hours. No results for input(s): AMMONIA in the last 8760 hours. CBC: Recent Labs    10/22/20 1121  WBC 7.0  NEUTROABS 3,836  HGB 13.1*  HCT 41.0  MCV 78.8*  PLT 251   Lipid Panel: Recent Labs    10/22/20 1121 01/19/21 0922  CHOL 156 137  HDL 48 46  LDLCALC 94 77  TRIG 58 67  CHOLHDL 3.3 3.0   TSH: No results for input(s): TSH in the last 8760 hours. A1C: Lab Results  Component Value Date   HGBA1C 7.6 (H) 01/19/2021     Assessment/Plan 1. Hyperlipidemia, unspecified hyperlipidemia type -to continue dietary modifications.  - Lipid Panel; Future - atorvastatin (LIPITOR) 20 MG tablet; To take 1 tablet by mouth three times weekly in the evening.  Dispense: 36 tablet; Refill: 3  2. Diabetes mellitus type 2 in obese (HCC) -A1c not at goal.  - discussed with the patient the pathophysiology of diabetes and the natural progression of the disease.  -stressed the importance of lifestyle changes including diet and exercise. -discussed complications associated with diabetes including retinopathy, nephropathy, neuropathy as  well as increased risk of cardiovascular disease. We went over the benefit seen with glycemic control.  -encouraged compliance of medication - Hemoglobin A1c; Future - COMPLETE METABOLIC PANEL WITH GFR; Future - dapagliflozin propanediol (FARXIGA) 5 MG TABS tablet; Take 1 tablet (5 mg total) by mouth daily before breakfast.  Dispense: 30 tablet; Refill: 3  3. Erectile dysfunction, unspecified erectile dysfunction type - sildenafil (VIAGRA) 100 MG tablet; Take 1 tablet (100 mg total) by mouth daily as needed for erectile dysfunction.  Dispense: 10 tablet; Refill: 3  4. Essential hypertension -not controlled, continue dietary modifications and educated on importance of proper control of blood pressure.  - COMPLETE METABOLIC PANEL WITH GFR; Future - losartan (COZAAR) 50 MG tablet; Take 1 tablet (50 mg total) by mouth daily.  Dispense: 90 tablet; Refill: 0  5. Osteoarthritis of both knees, unspecified osteoarthritis type Worsening pain, followed with orthopedics at this time.   6. Morbid obesity ([place]) -encouraged healthy lifestyle changes with diet and increase in physical activity.  Next appt: 3 months, labs prior to appt  Maddalena Linarez K. [place], [facility] [phone]

## 2021-03-04 ENCOUNTER — Ambulatory Visit: Payer: Managed Care, Other (non HMO) | Admitting: Nurse Practitioner

## 2021-04-22 ENCOUNTER — Other Ambulatory Visit: Payer: Managed Care, Other (non HMO)

## 2021-04-25 ENCOUNTER — Other Ambulatory Visit: Payer: Managed Care, Other (non HMO)

## 2021-04-27 ENCOUNTER — Ambulatory Visit: Payer: Managed Care, Other (non HMO) | Admitting: Nurse Practitioner

## 2021-04-27 ENCOUNTER — Other Ambulatory Visit: Payer: Self-pay | Admitting: Nurse Practitioner

## 2021-04-27 DIAGNOSIS — I10 Essential (primary) hypertension: Secondary | ICD-10-CM

## 2021-04-27 MED ORDER — LOSARTAN POTASSIUM 50 MG PO TABS: 50.0000 mg | ORAL_TABLET | Freq: Every day | ORAL | 0 refills | Status: DC

## 2021-05-11 ENCOUNTER — Other Ambulatory Visit: Payer: Managed Care, Other (non HMO)

## 2021-05-13 ENCOUNTER — Other Ambulatory Visit: Payer: Self-pay

## 2021-05-13 ENCOUNTER — Encounter: Payer: Self-pay | Admitting: Nurse Practitioner

## 2021-05-13 ENCOUNTER — Ambulatory Visit (INDEPENDENT_AMBULATORY_CARE_PROVIDER_SITE_OTHER): Payer: Managed Care, Other (non HMO) | Admitting: Nurse Practitioner

## 2021-05-13 VITALS — BP 140/100 | HR 79 | Temp 97.1°F | Ht 78.0 in | Wt 329.4 lb

## 2021-05-13 DIAGNOSIS — F419 Anxiety disorder, unspecified: Secondary | ICD-10-CM

## 2021-05-13 DIAGNOSIS — M170 Bilateral primary osteoarthritis of knee: Secondary | ICD-10-CM

## 2021-05-13 DIAGNOSIS — N469 Male infertility, unspecified: Secondary | ICD-10-CM

## 2021-05-13 DIAGNOSIS — M25562 Pain in left knee: Secondary | ICD-10-CM

## 2021-05-13 DIAGNOSIS — D509 Iron deficiency anemia, unspecified: Secondary | ICD-10-CM | POA: Diagnosis not present

## 2021-05-13 DIAGNOSIS — M25561 Pain in right knee: Secondary | ICD-10-CM

## 2021-05-13 DIAGNOSIS — N529 Male erectile dysfunction, unspecified: Secondary | ICD-10-CM

## 2021-05-13 DIAGNOSIS — G8929 Other chronic pain: Secondary | ICD-10-CM

## 2021-05-13 DIAGNOSIS — E669 Obesity, unspecified: Secondary | ICD-10-CM

## 2021-05-13 DIAGNOSIS — F339 Major depressive disorder, recurrent, unspecified: Secondary | ICD-10-CM

## 2021-05-13 DIAGNOSIS — I10 Essential (primary) hypertension: Secondary | ICD-10-CM

## 2021-05-13 DIAGNOSIS — E1169 Type 2 diabetes mellitus with other specified complication: Secondary | ICD-10-CM

## 2021-05-13 DIAGNOSIS — E785 Hyperlipidemia, unspecified: Secondary | ICD-10-CM

## 2021-05-13 MED ORDER — ALPRAZOLAM 1 MG PO TABS: 1.0000 mg | ORAL_TABLET | Freq: Every day | ORAL | 0 refills | Status: DC | PRN

## 2021-05-13 MED ORDER — SILDENAFIL CITRATE 100 MG PO TABS: 100.0000 mg | ORAL_TABLET | Freq: Every day | ORAL | 3 refills | Status: DC | PRN

## 2021-05-13 MED ORDER — DAPAGLIFLOZIN PROPANEDIOL 5 MG PO TABS: 5.0000 mg | ORAL_TABLET | Freq: Every day | ORAL | 3 refills | Status: DC

## 2021-05-13 MED ORDER — LOSARTAN POTASSIUM 50 MG PO TABS: 50.0000 mg | ORAL_TABLET | Freq: Every day | ORAL | 0 refills | Status: DC

## 2021-05-13 NOTE — Progress Notes (Signed)
Careteam: Patient Care Team: [name], NP as PCP - General (Geriatric Medicine) Melissa Montane, MD as Consulting Physician (Otolaryngology)  PLACE OF SERVICE:  [place] Directive information Does Patient Have a Medical Advance Directive?: No, Would patient like information on creating a medical advance directive?: No - Patient declined  Allergies  Allergen Reactions   Mushroom Extract Complex Anaphylaxis and Swelling    Lips   Shrimp [Shellfish Allergy] Anaphylaxis   Tomato Anaphylaxis   Tylenol [Acetaminophen] Swelling    Chief Complaint  Patient presents with   Medical Management of Chronic Issues    3 month follow up   Health Maintenance    COVID vaccine,Pneumonia vaccine, eye exam    HPI: Patient is a 35 y.o. male for routine follow up  DM- taking farxiga and does not like the way it makes him urinate. He is not on metformin due to GI side effects. He has been consistently taking medication and working on diet.   Needs referral for pain medication. In constant chronic pain. No one is trying to give him pain medicine. "35 years old and disabled" He has tried muscle relaxer and went to a concussion specialist and they gave him some medication which did not help.  He got prescription for hydrocodone-apap 5/325 mg has to take 2 to help him.  He has used mobic but did not help  He was told he needed bilateral knee replacement.   [facility]   Continues to try to conceive with wife continues on clomiphene and arimidex   Reports he stopped smoking in April.   Continues to have anxiety but trying to deal with this in a non-pharmacy way.  Has xanax but takes rarely. Was last given refill in 2021- will only take if he has panic attacks requesting refill today.   05/24/2020   1  Alprazolam 1 Mg Tablet    Review of Systems:  Review of Systems  Constitutional:  Negative for chills, fever and weight loss.   HENT:  Negative for tinnitus.   Respiratory:  Negative for cough, sputum production and shortness of breath.   Cardiovascular:  Negative for chest pain, palpitations and leg swelling.  Gastrointestinal:  Negative for abdominal pain, constipation, diarrhea and heartburn.  Genitourinary:  Negative for dysuria, frequency and urgency.  Musculoskeletal:  Positive for joint pain. Negative for back pain, falls and myalgias.  Skin: Negative.   Neurological:  Negative for dizziness and headaches.  Psychiatric/Behavioral:  Negative for depression. The patient is nervous/anxious. The patient does not have insomnia.   Past Medical History:  Diagnosis Date   Blood in stool    Diabetes mellitus without complication (HCC)    GERD (gastroesophageal reflux disease)    History of anal fissures    Low sperm motility    Past Surgical History:  Procedure Laterality Date   ANAL FISTULECTOMY  2012   with sphhincterotomy   ANAL FISTULOTOMY N/A 06/29/2015   Procedure: EXCISION PERI RECTAL SCAR/FISTULA, INTERNAL HEMMORRHOIDAL LIGATION, PEXY, MARSUPIALIZATION;  Surgeon: Michael Boston, MD;  Location: [facility];  Service: General;  Laterality: N/A;   EXAMINATION UNDER ANESTHESIA N/A 06/29/2015   Procedure: Jasmine December UNDER ANESTHESIA;  Surgeon: Michael Boston, MD;  Location: [facility];  Service: General;  Laterality: N/A;   KNEE ARTHROSCOPY W/ MENISCAL REPAIR  08/08/2019   KNEE SURGERY  07/07/2018   RECTAL SURGERY  08/2010   Dr Zenia Resides   Social History:  reports that he quit smoking about 2 months ago. His smoking use included cigarettes. He has never used smokeless tobacco. He reports current alcohol use. He reports that he does not use drugs.  Family History  Problem Relation Age of Onset   Diabetes Mother    Hypertension Mother    Throat cancer Maternal Aunt    Cirrhosis Maternal Grandfather    Colon cancer Neg Hx    Colon polyps Neg Hx    Kidney disease Neg Hx     Medications: Patient's Medications  New  Prescriptions   No medications on file  Previous Medications   ANASTROZOLE (ARIMIDEX) 1 MG TABLET    anastrozole 1 mg tablet  TAKE 1 TABLET BY MOUTH EVERY DAY   ASPIRIN 81 MG EC TABLET    aspirin 81 mg tablet,delayed release  TAKE 1 TABLET (81 MG TOTAL) BY MOUTH 2 TIMES DAILY FOR 14 DAYS.   ATORVASTATIN (LIPITOR) 20 MG TABLET    To take 1 tablet by mouth three times weekly in the evening.   CLOMIPHENE (CLOMID) 50 MG TABLET    clomiphene citrate 50 mg tablet  TAKE 1 TABLET BY MOUTH EVERY DAY   CYCLOBENZAPRINE (FLEXERIL) 10 MG TABLET    Take 1 tablet (10 mg total) by mouth 2 (two) times daily as needed for muscle spasms.   DAPAGLIFLOZIN PROPANEDIOL (FARXIGA) 5 MG TABS TABLET    Take 1 tablet (5 mg total) by mouth daily before breakfast.   IBUPROFEN (ADVIL) 800 MG TABLET    Take 1 tablet (800 mg total) by mouth 3 (three) times daily.   LOSARTAN (COZAAR) 50 MG TABLET    Take 1 tablet (50 mg total) by mouth daily.   NICOTINE (NICODERM CQ - DOSED IN MG/24 HOURS) 14 MG/24HR PATCH       SILDENAFIL (VIAGRA) 100 MG TABLET    Take 1 tablet (100 mg total) by mouth daily as needed for erectile dysfunction.  Modified Medications   No medications on file  Discontinued Medications   No medications on file    Physical Exam:  Vitals:   05/13/21 1133  BP: (!) 140/100  Pulse: 79  Temp: (!) 97.1 F (36.2 C)  TempSrc: Temporal  SpO2: 99%  Weight: (!) 329 lb 6.4 oz (149.4 kg)  Height: 6\' 6"  (1.981 m)   Body mass index is 38.07 kg/m. Wt Readings from Last 3 Encounters:  05/13/21 (!) 329 lb 6.4 oz (149.4 kg)  01/24/21 (!) 339 lb 9.6 oz (154 kg)  10/22/20 (!) 346 lb 3.2 oz (157 kg)    Physical Exam Constitutional:      General: He is not in acute distress.    Appearance: He is well-developed. He is not diaphoretic.  HENT:     Head: Normocephalic and atraumatic.     Right Ear: External ear normal.     Left Ear: External ear normal.     Mouth/Throat:     Pharynx: No oropharyngeal exudate.   Eyes:     Conjunctiva/sclera: Conjunctivae normal.     Pupils: Pupils are equal, round, and reactive to light.  Cardiovascular:     Rate and Rhythm: Normal rate and regular rhythm.     Heart sounds: Normal heart sounds.  Pulmonary:     Effort: Pulmonary effort is normal.     Breath sounds: Normal breath sounds.  Abdominal:     General: Bowel sounds are normal.     Palpations: Abdomen is soft.  Musculoskeletal:  General: No tenderness.     Cervical back: Normal range of motion and neck supple.     Right lower leg: No edema.     Left lower leg: No edema.  Skin:    General: Skin is warm and dry.  Neurological:     Mental Status: He is alert and oriented to person, place, and time.    Labs reviewed: Basic Metabolic Panel: Recent Labs    10/22/20 1121  NA 140  K 4.6  CL 105  CO2 30  GLUCOSE 154*  BUN 13  CREATININE 1.10  CALCIUM 9.7   Liver Function Tests: Recent Labs    10/22/20 1121  AST 26  ALT 26  BILITOT 0.4  PROT 6.8   No results for input(s): LIPASE, AMYLASE in the last 8760 hours. No results for input(s): AMMONIA in the last 8760 hours. CBC: Recent Labs    10/22/20 1121  WBC 7.0  NEUTROABS 3,836  HGB 13.1*  HCT 41.0  MCV 78.8*  PLT 251   Lipid Panel: Recent Labs    10/22/20 1121 01/19/21 0922  CHOL 156 137  HDL 48 46  LDLCALC 94 77  TRIG 58 67  CHOLHDL 3.3 3.0   TSH: No results for input(s): TSH in the last 8760 hours. A1C: Lab Results  Component Value Date   HGBA1C 7.6 (H) 01/19/2021     Assessment/Pla 1. Iron deficiency anemia, unspecified iron deficiency anemia type Noted on labs, no signs of blood loss.  - CBC with Differential/Platelet - Iron, TIBC and Ferritin Panel  2. Chronic pain of both knees -chronic pain of bilateral knees due to OA- needing bilateral replacement but he does not wish to go through with this.  -discussed pain management but he request   3. Diabetes mellitus type 2 in obese  (HCC) -Encouraged dietary compliance as well as medication compliance, routine foot care/monitoring and to keep up with diabetic eye exams through ophthalmology  -discussed changing medication but he would like to continue with farxiga at this time.  - dapagliflozin propanediol (FARXIGA) 5 MG TABS tablet; Take 1 tablet (5 mg total) by mouth daily before breakfast.  Dispense: 30 tablet; Refill: 3  4. Erectile dysfunction, unspecified erectile dysfunction type - sildenafil (VIAGRA) 100 MG tablet; Take 1 tablet (100 mg total) by mouth daily as needed for erectile dysfunction.  Dispense: 10 tablet; Refill: 3  5. Hyperlipidemia, unspecified hyperlipidemia type -currently not taking medication at this time.   6. Osteoarthritis of both knees, unspecified osteoarthritis type -previously followed by orthopedics but does not wish to have additional surgery at this time.  7. Anxiety -tries to manage with non-pharmacological methods, occasionally will have panic attack and request xanax to be refilled. Refill given.  - ALPRAZolam (XANAX) 1 MG tablet; Take 1 tablet (1 mg total) by mouth daily as needed for anxiety.  Dispense: 20 tablet; Refill: 0  8. Depression, recurrent ([place]) Stable at this time   9. Male fertility problem -followed by specialist.   10. Essential hypertension -stable, elevated BP today reports generally well controlled on medication, will have him monitor. Low sodium diet.  - losartan (COZAAR) 50 MG tablet; Take 1 tablet (50 mg total) by mouth daily.  Dispense: 90 tablet; Refill: 0   Next appt: 3 months, sooner if needed Seriyah Collison K. [place], [facility] [phone]

## 2021-05-14 LAB — COMPLETE METABOLIC PANEL WITH GFR
AG Ratio: 1.8 (calc) (ref 1.0–2.5)
ALT: 27 U/L (ref 9–46)
AST: 24 U/L (ref 10–40)
Albumin: 4.1 g/dL (ref 3.6–5.1)
Alkaline phosphatase (APISO): 66 U/L (ref 36–130)
BUN: 13 mg/dL (ref 7–25)
CO2: 28 mmol/L (ref 20–32)
Calcium: 9.3 mg/dL (ref 8.6–10.3)
Chloride: 106 mmol/L (ref 98–110)
Creat: 1.19 mg/dL (ref 0.60–1.35)
GFR, Est African American: 92 mL/min/{1.73_m2} (ref >=60)
GFR, Est Non African American: 79 mL/min/{1.73_m2} (ref >=60)
Globulin: 2.3 g/dL (calc) (ref 1.9–3.7)
Glucose, Bld: 132 mg/dL — ABNORMAL HIGH (ref 65–99)
Potassium: 4.8 mmol/L (ref 3.5–5.3)
Sodium: 141 mmol/L (ref 135–146)
Total Bilirubin: 0.3 mg/dL (ref 0.2–1.2)
Total Protein: 6.4 g/dL (ref 6.1–8.1)

## 2021-05-14 LAB — IRON,TIBC AND FERRITIN PANEL
%SAT: 13 % (calc) — ABNORMAL LOW (ref 20–48)
Ferritin: 75 ng/mL (ref 38–380)
Iron: 42 ug/dL — ABNORMAL LOW (ref 50–180)
TIBC: 312 mcg/dL (calc) (ref 250–425)

## 2021-05-14 LAB — CBC WITH DIFFERENTIAL/PLATELET
Absolute Monocytes: 476 cells/uL (ref 200–950)
Basophils Absolute: 22 cells/uL (ref 0–200)
Basophils Relative: 0.4 %
Eosinophils Absolute: 39 cells/uL (ref 15–500)
Eosinophils Relative: 0.7 %
HCT: 44.7 % (ref 38.5–50.0)
Hemoglobin: 14 g/dL (ref 13.2–17.1)
Lymphs Abs: 1831 cells/uL (ref 850–3900)
MCH: 25.2 pg — ABNORMAL LOW (ref 27.0–33.0)
MCHC: 31.3 g/dL — ABNORMAL LOW (ref 32.0–36.0)
MCV: 80.5 fL (ref 80.0–100.0)
MPV: 11.7 fL (ref 7.5–12.5)
Monocytes Relative: 8.5 %
Neutro Abs: 3231 cells/uL (ref 1500–7800)
Neutrophils Relative %: 57.7 %
Platelets: 213 10*3/uL (ref 140–400)
RBC: 5.55 10*6/uL (ref 4.20–5.80)
RDW: 13.5 % (ref 11.0–15.0)
Total Lymphocyte: 32.7 %
WBC: 5.6 10*3/uL (ref 3.8–10.8)

## 2021-05-14 LAB — LIPID PANEL
Cholesterol: 123 mg/dL (ref <200)
HDL: 41 mg/dL (ref >=40)
LDL Cholesterol (Calc): 70 mg/dL (calc)
Non-HDL Cholesterol (Calc): 82 mg/dL (calc) (ref <130)
Total CHOL/HDL Ratio: 3 (calc) (ref <5.0)
Triglycerides: 50 mg/dL (ref <150)

## 2021-05-14 LAB — HEMOGLOBIN A1C
Hgb A1c MFr Bld: 7.5 % of total Hgb — ABNORMAL HIGH (ref <5.7)
Mean Plasma Glucose: 169 mg/dL
eAG (mmol/L): 9.3 mmol/L

## 2021-05-19 ENCOUNTER — Telehealth: Payer: Self-pay | Admitting: Nurse Practitioner

## 2021-05-19 NOTE — Telephone Encounter (Signed)
Patient states that what he asked for VS what was typed is incorrect. Patient states that he needed a letter from PCP [name], NP stating that it's OK for him to drive in general, NOT while on controlled substances. Patient states that he's more than aware that driving while using controlled substance is dangerous. Patient already went to get DOT form completed. However employer only wanted to make sure that patient is able to drive to keep his job. Message routed back to PCP Dewaine Oats Carlos American, NP .

## 2021-05-19 NOTE — Telephone Encounter (Signed)
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 He also needs his A1C lab results faxed to a John at [place], but I think we can do that here.   Mordecai Maes

## 2021-05-19 NOTE — Telephone Encounter (Signed)
He should not be driving at all while on controlled medications.  He needs to sign a record release for [place] to send any of his records.

## 2021-05-19 NOTE — Telephone Encounter (Signed)
Completed, please place on my desk for signature when I am in office tomorrow.

## 2021-05-23 ENCOUNTER — Telehealth: Payer: Self-pay

## 2021-05-23 NOTE — Telephone Encounter (Signed)
Patient called office and request that medication "Xanax" be sent via fax [phone] with Attention John to show how patient is taking medication. Papers faxed to requested location.

## 2021-05-26 ENCOUNTER — Other Ambulatory Visit: Payer: Self-pay | Admitting: Family

## 2021-05-26 ENCOUNTER — Telehealth: Payer: Self-pay

## 2021-05-26 DIAGNOSIS — F419 Anxiety disorder, unspecified: Secondary | ICD-10-CM

## 2021-05-26 NOTE — Telephone Encounter (Signed)
Patient will be informed. Please route this encounter to the provider who may be able to help assist. Message routed back to Marlowe Sax, NP

## 2021-05-26 NOTE — Telephone Encounter (Signed)
I attempted to log onto PMP Aware web site to print requested information but was unable to access my account states Licensed already.Attempted with  chare to print from Med list but no PMDP unless refilling med.may have to get another provider to attempt to print for patient until I can log in.

## 2021-05-26 NOTE — Telephone Encounter (Signed)
Patient just called office in regards to update on papers being faxed. I informed patient that we are still working on this. Patient states that tomorrow is the cut off. Patient states he desperately needs these papers printed and faxed today.

## 2021-05-26 NOTE — Telephone Encounter (Signed)
Amy,Can you print patient's PDMP report for Xanax I'm unable to access the [facility] website. Patient needs report to be cleared at work.  Thanks

## 2021-05-26 NOTE — Telephone Encounter (Signed)
Patient came into office this morning 05/26/2021 requesting that we print off copy of prescription monitoring site "PMP Aware". Patient needs this paper faxed to [phone] with Attention "John". Patient states this is for his employment/classes. Patient needs this last step to be completed in order to be cleared. Patient came into office and waited. Patient had to leave due to extended wait. Patient states he'd like for this task to be handled today if at all possible. Message routed to Marlowe Sax, NP for completion. Due to PCP [name], NP being out of office.

## 2021-05-27 ENCOUNTER — Other Ambulatory Visit: Payer: Self-pay | Admitting: Orthopedic Surgery

## 2021-05-27 NOTE — Telephone Encounter (Signed)
Patient PMP Aware paper placed on my desk and faxed to  [phone]. Called [facility] [phone] to speak to John. He confirmed he received fax and he didn't need anything else on my end. Patient called and notified. Patient states that he will call Jenny Reichmann himself and check on the status of his papers.

## 2021-08-18 ENCOUNTER — Other Ambulatory Visit: Payer: Self-pay | Admitting: Nurse Practitioner

## 2021-09-12 ENCOUNTER — Ambulatory Visit: Payer: Managed Care, Other (non HMO) | Admitting: Nurse Practitioner

## 2021-09-14 ENCOUNTER — Ambulatory Visit: Payer: Managed Care, Other (non HMO) | Admitting: Nurse Practitioner

## 2021-10-07 ENCOUNTER — Ambulatory Visit: Payer: Medicaid Other | Admitting: Nurse Practitioner

## 2021-10-10 ENCOUNTER — Ambulatory Visit (INDEPENDENT_AMBULATORY_CARE_PROVIDER_SITE_OTHER): Payer: BLUE CROSS/BLUE SHIELD | Admitting: Nurse Practitioner

## 2021-10-10 ENCOUNTER — Other Ambulatory Visit: Payer: Self-pay

## 2021-10-10 ENCOUNTER — Encounter: Payer: Self-pay | Admitting: Nurse Practitioner

## 2021-10-10 VITALS — BP 146/100 | HR 77 | Temp 97.5°F | Ht 78.0 in | Wt 342.0 lb

## 2021-10-10 DIAGNOSIS — I10 Essential (primary) hypertension: Secondary | ICD-10-CM

## 2021-10-10 DIAGNOSIS — E119 Type 2 diabetes mellitus without complications: Secondary | ICD-10-CM

## 2021-10-10 DIAGNOSIS — F17200 Nicotine dependence, unspecified, uncomplicated: Secondary | ICD-10-CM

## 2021-10-10 DIAGNOSIS — E785 Hyperlipidemia, unspecified: Secondary | ICD-10-CM

## 2021-10-10 DIAGNOSIS — E669 Obesity, unspecified: Secondary | ICD-10-CM

## 2021-10-10 DIAGNOSIS — E1169 Type 2 diabetes mellitus with other specified complication: Secondary | ICD-10-CM | POA: Diagnosis not present

## 2021-10-10 DIAGNOSIS — F419 Anxiety disorder, unspecified: Secondary | ICD-10-CM

## 2021-10-10 DIAGNOSIS — D509 Iron deficiency anemia, unspecified: Secondary | ICD-10-CM | POA: Diagnosis not present

## 2021-10-10 MED ORDER — LOSARTAN POTASSIUM 50 MG PO TABS: 50.0000 mg | ORAL_TABLET | Freq: Every day | ORAL | 0 refills | Status: DC

## 2021-10-10 MED ORDER — NICOTINE 14 MG/24HR TD PT24: 14.0000 mg | MEDICATED_PATCH | Freq: Every day | TRANSDERMAL | 0 refills | Status: DC

## 2021-10-10 MED ORDER — ATORVASTATIN CALCIUM 20 MG PO TABS: ORAL_TABLET | ORAL | 3 refills | Status: DC

## 2021-10-10 MED ORDER — DAPAGLIFLOZIN PROPANEDIOL 5 MG PO TABS: 5.0000 mg | ORAL_TABLET | Freq: Every day | ORAL | 0 refills | Status: DC

## 2021-10-10 MED ORDER — ALPRAZOLAM 1 MG PO TABS: 1.0000 mg | ORAL_TABLET | Freq: Every day | ORAL | 0 refills | Status: DC | PRN

## 2021-10-10 NOTE — Progress Notes (Signed)
Careteam: Patient Care Team: [name], NP as PCP - General (Geriatric Medicine) Melissa Montane, MD as Consulting Physician (Otolaryngology)  PLACE OF SERVICE:  [place]  Advanced Directive information    Allergies  Allergen Reactions   Mushroom Extract Complex Anaphylaxis and Swelling    Lips   Shrimp [Shellfish Allergy] Anaphylaxis   Tomato Anaphylaxis   Tylenol [Acetaminophen] Swelling    Chief Complaint  Patient presents with   Medical Management of Chronic Issues    4 month follow-up. Discuss need for covid vaccine, eye exam, and pneu vaccine or postpone if patient refuses.     HPI: Patient is a 35 y.o. male for routine follow up.  Reports he has not been taking any of his medication for 2.5 months. Discussed the adverse effects on high blood pressure, uncontrolled diabetes and cholesterol overtime.   He has been going to the gym. Trying to slim down at this time.  He is lifting a lot more. He has gone down in pant size.   Reports knees are doing good- some days are better than other.   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   Reports anxiety kick in frequently and sometimes he can get in through it. Sometimes 2-3 times a week. Able to work through most of the time rarely will need xanax.  Does not wish to be on a daily medication.   He started smoking again. He is smoking 1 ppd. For 1 month.  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     Review of Systems:  Review of Systems  Constitutional:  Negative for chills, fever and weight loss.  HENT:  Negative for tinnitus.   Respiratory:  Negative for cough, sputum production and shortness of breath.   Cardiovascular:  Negative for chest pain, palpitations and leg swelling.  Gastrointestinal:  Negative for abdominal pain, constipation, diarrhea and heartburn.  Genitourinary:  Negative for dysuria, frequency and urgency.  Musculoskeletal:   Negative for back pain, falls, joint pain and myalgias.  Skin: Negative.   Neurological:  Negative for dizziness and headaches.  Psychiatric/Behavioral:  Negative for depression and memory loss. The patient does not have insomnia.    Past Medical History:  Diagnosis Date   Blood in stool    Diabetes mellitus without complication (HCC)    GERD (gastroesophageal reflux disease)    History of anal fissures    Low sperm motility    Past Surgical History:  Procedure Laterality Date   ANAL FISTULECTOMY  2012   with sphhincterotomy   ANAL FISTULOTOMY N/A 06/29/2015   Procedure: EXCISION PERI RECTAL SCAR/FISTULA, INTERNAL HEMMORRHOIDAL LIGATION, PEXY, MARSUPIALIZATION;  Surgeon: Michael Boston, MD;  Location: [facility];  Service: General;  Laterality: N/A;   EXAMINATION UNDER ANESTHESIA N/A 06/29/2015   Procedure: EXAM UNDER ANESTHESIA;  Surgeon: Michael Boston, MD;  Location: [facility];  Service: General;  Laterality: N/A;   KNEE ARTHROSCOPY W/ MENISCAL REPAIR  08/08/2019   KNEE SURGERY  07/07/2018   RECTAL SURGERY  08/2010   Dr Zenia Resides   Social History:   reports that he quit smoking about 7 months ago. His smoking use included cigarettes. He has never used smokeless tobacco. He reports current alcohol use. He reports that he does not use drugs.  Family History  Problem Relation Age of Onset   Diabetes Mother    Hypertension Mother    Throat cancer Maternal Aunt  Cirrhosis Maternal Grandfather    Colon cancer Neg Hx    Colon polyps Neg Hx    Kidney disease Neg Hx     Medications: Patient's Medications  New Prescriptions   No medications on file  Previous Medications   ALPRAZOLAM (XANAX) 1 MG TABLET    Take 1 tablet (1 mg total) by mouth daily as needed for anxiety.   ANASTROZOLE (ARIMIDEX) 1 MG TABLET    anastrozole 1 mg tablet  TAKE 1 TABLET BY MOUTH EVERY DAY   ASPIRIN 81 MG EC TABLET    aspirin 81 mg tablet,delayed release  TAKE 1 TABLET (81 MG TOTAL) BY MOUTH 2 TIMES DAILY FOR 14  DAYS.   ATORVASTATIN (LIPITOR) 20 MG TABLET    To take 1 tablet by mouth three times weekly in the evening.   CLOMIPHENE (CLOMID) 50 MG TABLET    clomiphene citrate 50 mg tablet  TAKE 1 TABLET BY MOUTH EVERY DAY   CYCLOBENZAPRINE (FLEXERIL) 10 MG TABLET    Take 1 tablet (10 mg total) by mouth 2 (two) times daily as needed for muscle spasms.   DAPAGLIFLOZIN PROPANEDIOL (FARXIGA) 5 MG TABS TABLET    Take 1 tablet (5 mg total) by mouth daily before breakfast.   IBUPROFEN (ADVIL) 800 MG TABLET    Take 1 tablet (800 mg total) by mouth 3 (three) times daily.   LOSARTAN (COZAAR) 50 MG TABLET    Take 1 tablet (50 mg total) by mouth daily.   SILDENAFIL (VIAGRA) 100 MG TABLET    Take 1 tablet (100 mg total) by mouth daily as needed for erectile dysfunction.  Modified Medications   No medications on file  Discontinued Medications   No medications on file    Physical Exam:  Vitals:   10/10/21 0754  BP: (!) 146/100  Pulse: 77  Temp: (!) 97.5 F (36.4 C)  TempSrc: Temporal  SpO2: 99%  Weight: (!) 342 lb (155.1 kg)  Height: 6\' 6"  (1.981 m)   Body mass index is 38.07 kg/m. Wt Readings from Last 3 Encounters:  05/13/21 (!) 329 lb 6.4 oz (149.4 kg)  01/24/21 (!) 339 lb 9.6 oz (154 kg)  10/22/20 (!) 346 lb 3.2 oz (157 kg)    Physical Exam Constitutional:      General: He is not in acute distress.    Appearance: He is well-developed. He is not diaphoretic.  HENT:     Head: Normocephalic and atraumatic.     Right Ear: External ear normal.     Left Ear: External ear normal.     Mouth/Throat:     Pharynx: No oropharyngeal exudate.  Eyes:     Conjunctiva/sclera: Conjunctivae normal.     Pupils: Pupils are equal, round, and reactive to light.  Cardiovascular:     Rate and Rhythm: Normal rate and regular rhythm.     Heart sounds: Normal heart sounds.  Pulmonary:     Effort: Pulmonary effort is normal.     Breath sounds: Normal breath sounds.  Abdominal:     General: Bowel sounds are  normal.     Palpations: Abdomen is soft.  Musculoskeletal:        General: No tenderness.     Cervical back: Normal range of motion and neck supple.     Right lower leg: No edema.     Left lower leg: No edema.  Skin:    General: Skin is warm and dry.  Neurological:     Mental Status: He  is alert and oriented to person, place, and time.    Labs reviewed: Basic Metabolic Panel: Recent Labs    10/22/20 1121 05/13/21 1033  NA 140 141  K 4.6 4.8  CL 105 106  CO2 30 28  GLUCOSE 154* 132*  BUN 13 13  CREATININE 1.10 1.19  CALCIUM 9.7 9.3   Liver Function Tests: Recent Labs    10/22/20 1121 05/13/21 1033  AST 26 24  ALT 26 27  BILITOT 0.4 0.3  PROT 6.8 6.4   No results for input(s): LIPASE, AMYLASE in the last 8760 hours. No results for input(s): AMMONIA in the last 8760 hours. CBC: Recent Labs    10/22/20 1121 05/13/21 1033  WBC 7.0 5.6  NEUTROABS 3,836 3,231  HGB 13.1* 14.0  HCT 41.0 44.7  MCV 78.8* 80.5  PLT 251 213   Lipid Panel: Recent Labs    10/22/20 1121 01/19/21 0922 05/13/21 1033  CHOL 156 137 123  HDL 48 46 41  LDLCALC 94 77 70  TRIG 58 67 50  CHOLHDL 3.3 3.0 3.0   TSH: No results for input(s): TSH in the last 8760 hours. A1C: Lab Results  Component Value Date   HGBA1C 7.5 (H) 05/13/2021     Assessment/Plan 1. Iron deficiency anemia, unspecified iron deficiency anemia type -no signs of acute blood loss.  - CBC with Differential/Platelet  2. Diabetes mellitus type 2 in obese ([place]) -Encouraged dietary compliance, routine foot care/monitoring and to keep up with diabetic eye exams through ophthalmology  - discussed with the patient the pathophysiology of diabetes and the natural progression of the disease.  -stressed the importance of lifestyle changes including diet and exercise. -discussed complications associated with diabetes including retinopathy, nephropathy, neuropathy as well as increased risk of cardiovascular disease. We went  over the benefit seen with glycemic control.  -he is agreeable to restart medication at this time.  - CBC with Differential/Platelet - Hemoglobin F0Y - BASIC METABOLIC PANEL WITH GFR - dapagliflozin propanediol (FARXIGA) 5 MG TABS tablet; Take 1 tablet (5 mg total) by mouth daily before breakfast.  Dispense: 90 tablet; Refill: 0  3. Essential hypertension -blood pressure not controlled since not on medication. Also went into detail about long term effects of uncontrolled blood pressure on heart, kidney and brain. He is agreeable to restart medication.  - CBC with Differential/Platelet - BASIC METABOLIC PANEL WITH GFR - losartan (COZAAR) 50 MG tablet; Take 1 tablet (50 mg total) by mouth daily.  Dispense: 90 tablet; Refill: 0  4. Hyperlipidemia, unspecified hyperlipidemia type -has stopped medication, stressed importance of lipid control, esp in diabetes. Discuss long term effects of uncontrolled cholesterol and risk for heart disease, stroke, etc. He is in agreement to restart medication.  - Lipid panel - atorvastatin (LIPITOR) 20 MG tablet; To take 1 tablet by mouth three times weekly in the evening.  Dispense: 36 tablet; Refill: 3  5. Anxiety -overall managing with lifestyle. Occasionally still needs alprazolam PRN.  - ALPRAZolam (XANAX) 1 MG tablet; Take 1 tablet (1 mg total) by mouth daily as needed for anxiety.  Dispense: 20 tablet; Refill: 0  6. Smoker -would like to quit smoking at this time.  - nicotine (NICODERM CQ - DOSED IN MG/24 HOURS) 14 mg/24hr patch; Place 1 patch (14 mg total) onto the skin daily.  Dispense: 42 patch; Refill: 0   Next appt: 3 months.  Jonathan Taylor. [place], [facility] [phone]

## 2021-10-10 NOTE — Patient Instructions (Addendum)
Start nicotine patch 14mg /day for 6 weeks then decrease to nicotine patch 7 mg/day for 2 weeks.   Restart losartan- this is for blood pressure Restart farxiga- for diabetes.   Diabetes uncontrolled over time can cause diseases including retinopathy, kidney disease, neuropathy as well as increased risk of cardiovascular disease.

## 2021-10-12 LAB — BASIC METABOLIC PANEL WITH GFR
BUN: 9 mg/dL (ref 7–25)
CO2: 27 mmol/L (ref 20–32)
Calcium: 9.1 mg/dL (ref 8.6–10.3)
Chloride: 104 mmol/L (ref 98–110)
Creat: 0.97 mg/dL (ref 0.60–1.26)
Glucose, Bld: 158 mg/dL — ABNORMAL HIGH (ref 65–99)
Potassium: 4.3 mmol/L (ref 3.5–5.3)
Sodium: 140 mmol/L (ref 135–146)
eGFR: 104 mL/min/{1.73_m2} (ref >=60)

## 2021-10-12 LAB — CBC WITH DIFFERENTIAL/PLATELET
Absolute Monocytes: 487 cells/uL (ref 200–950)
Basophils Absolute: 17 cells/uL (ref 0–200)
Basophils Relative: 0.3 %
Eosinophils Absolute: 70 cells/uL (ref 15–500)
Eosinophils Relative: 1.2 %
HCT: 43.5 % (ref 38.5–50.0)
Hemoglobin: 14.1 g/dL (ref 13.2–17.1)
Lymphs Abs: 2320 cells/uL (ref 850–3900)
MCH: 25.8 pg — ABNORMAL LOW (ref 27.0–33.0)
MCHC: 32.4 g/dL (ref 32.0–36.0)
MCV: 79.7 fL — ABNORMAL LOW (ref 80.0–100.0)
MPV: 11.3 fL (ref 7.5–12.5)
Monocytes Relative: 8.4 %
Neutro Abs: 2906 cells/uL (ref 1500–7800)
Neutrophils Relative %: 50.1 %
Platelets: 253 10*3/uL (ref 140–400)
RBC: 5.46 10*6/uL (ref 4.20–5.80)
RDW: 13.4 % (ref 11.0–15.0)
Total Lymphocyte: 40 %
WBC: 5.8 10*3/uL (ref 3.8–10.8)

## 2021-10-12 LAB — LIPID PANEL
Cholesterol: 133 mg/dL (ref <200)
HDL: 54 mg/dL (ref >=40)
LDL Cholesterol (Calc): 67 mg/dL (calc)
Non-HDL Cholesterol (Calc): 79 mg/dL (calc) (ref <130)
Total CHOL/HDL Ratio: 2.5 (calc) (ref <5.0)
Triglycerides: 43 mg/dL (ref <150)

## 2021-10-12 LAB — HEMOGLOBIN A1C
Hgb A1c MFr Bld: 8.3 % of total Hgb — ABNORMAL HIGH (ref <5.7)
Mean Plasma Glucose: 192 mg/dL
eAG (mmol/L): 10.6 mmol/L

## 2021-10-21 ENCOUNTER — Other Ambulatory Visit: Payer: Self-pay | Admitting: *Deleted

## 2021-10-21 DIAGNOSIS — F17200 Nicotine dependence, unspecified, uncomplicated: Secondary | ICD-10-CM

## 2021-10-21 DIAGNOSIS — E1169 Type 2 diabetes mellitus with other specified complication: Secondary | ICD-10-CM

## 2021-10-21 MED ORDER — DAPAGLIFLOZIN PROPANEDIOL 5 MG PO TABS: 5.0000 mg | ORAL_TABLET | Freq: Every day | ORAL | 0 refills | Status: DC

## 2021-10-21 MED ORDER — NICOTINE 14 MG/24HR TD PT24: 14.0000 mg | MEDICATED_PATCH | Freq: Every day | TRANSDERMAL | 0 refills | Status: DC

## 2021-10-21 NOTE — Telephone Encounter (Signed)
Patient called and stated he was seen last week and the pharmacy received all of his medication refills except the Farxiga and Nicotine Patch. Patient requested these to be sent to the pharmacy.

## 2021-11-13 ENCOUNTER — Other Ambulatory Visit: Payer: Self-pay

## 2021-11-13 ENCOUNTER — Emergency Department (HOSPITAL_BASED_OUTPATIENT_CLINIC_OR_DEPARTMENT_OTHER)
Admission: EM | Admit: 2021-11-13 | Discharge: 2021-11-13 | Disposition: A | Discharge status: Home / Self Care | Payer: No Typology Code available for payment source | Attending: Emergency Medicine | Admitting: Emergency Medicine

## 2021-11-13 ENCOUNTER — Encounter (HOSPITAL_BASED_OUTPATIENT_CLINIC_OR_DEPARTMENT_OTHER): Payer: Self-pay | Admitting: *Deleted

## 2021-11-13 ENCOUNTER — Emergency Department (HOSPITAL_BASED_OUTPATIENT_CLINIC_OR_DEPARTMENT_OTHER): Payer: No Typology Code available for payment source | Admitting: Radiology

## 2021-11-13 DIAGNOSIS — F17210 Nicotine dependence, cigarettes, uncomplicated: Secondary | ICD-10-CM | POA: Diagnosis not present

## 2021-11-13 DIAGNOSIS — E119 Type 2 diabetes mellitus without complications: Secondary | ICD-10-CM | POA: Insufficient documentation

## 2021-11-13 DIAGNOSIS — S8991XA Unspecified injury of right lower leg, initial encounter: Secondary | ICD-10-CM | POA: Insufficient documentation

## 2021-11-13 DIAGNOSIS — Y92410 Unspecified street and highway as the place of occurrence of the external cause: Secondary | ICD-10-CM | POA: Diagnosis not present

## 2021-11-13 DIAGNOSIS — M545 Low back pain, unspecified: Secondary | ICD-10-CM | POA: Insufficient documentation

## 2021-11-13 DIAGNOSIS — Z7984 Long term (current) use of oral hypoglycemic drugs: Secondary | ICD-10-CM | POA: Insufficient documentation

## 2021-11-13 DIAGNOSIS — S060X0A Concussion without loss of consciousness, initial encounter: Secondary | ICD-10-CM | POA: Diagnosis not present

## 2021-11-13 DIAGNOSIS — S79911A Unspecified injury of right hip, initial encounter: Secondary | ICD-10-CM | POA: Diagnosis not present

## 2021-11-13 DIAGNOSIS — S0990XA Unspecified injury of head, initial encounter: Secondary | ICD-10-CM | POA: Diagnosis present

## 2021-11-13 MED ORDER — OXYCODONE HCL 5 MG PO CAPS: 5.0000 mg | ORAL_CAPSULE | Freq: Four times a day (QID) | ORAL | 0 refills | Status: DC | PRN

## 2021-11-13 MED ORDER — ONDANSETRON 4 MG PO TBDP
8.0000 mg | ORAL_TABLET | Freq: Once | ORAL | Status: AC
  Administered 2021-11-13: 8 mg via ORAL
  Filled 2021-11-13: qty 2

## 2021-11-13 MED ORDER — HYDROMORPHONE HCL 1 MG/ML IJ SOLN
2.0000 mg | Freq: Once | INTRAMUSCULAR | Status: AC
  Administered 2021-11-13: 2 mg via INTRAMUSCULAR
  Filled 2021-11-13: qty 2
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
## 2021-11-24 ENCOUNTER — Telehealth (INDEPENDENT_AMBULATORY_CARE_PROVIDER_SITE_OTHER): Payer: Medicare Other | Admitting: Nurse Practitioner

## 2021-11-24 ENCOUNTER — Other Ambulatory Visit: Payer: Self-pay

## 2021-11-24 DIAGNOSIS — S060X0D Concussion without loss of consciousness, subsequent encounter: Secondary | ICD-10-CM | POA: Diagnosis not present

## 2021-11-24 DIAGNOSIS — E669 Obesity, unspecified: Secondary | ICD-10-CM

## 2021-11-24 DIAGNOSIS — I10 Essential (primary) hypertension: Secondary | ICD-10-CM

## 2021-11-24 DIAGNOSIS — E1169 Type 2 diabetes mellitus with other specified complication: Secondary | ICD-10-CM | POA: Diagnosis not present
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
## 2021-11-25 ENCOUNTER — Encounter: Payer: Self-pay | Admitting: Nurse Practitioner

## 2021-11-25 ENCOUNTER — Ambulatory Visit (INDEPENDENT_AMBULATORY_CARE_PROVIDER_SITE_OTHER): Payer: Medicare Other | Admitting: Nurse Practitioner

## 2021-11-25 ENCOUNTER — Other Ambulatory Visit: Payer: Self-pay

## 2021-11-25 VITALS — BP 158/112 | HR 73 | Temp 97.3°F | Ht 78.0 in | Wt 344.0 lb

## 2021-11-25 DIAGNOSIS — E1169 Type 2 diabetes mellitus with other specified complication: Secondary | ICD-10-CM

## 2021-11-25 DIAGNOSIS — F339 Major depressive disorder, recurrent, unspecified: Secondary | ICD-10-CM | POA: Diagnosis not present

## 2021-11-25 DIAGNOSIS — E669 Obesity, unspecified: Secondary | ICD-10-CM | POA: Diagnosis not present

## 2021-11-25 DIAGNOSIS — S060X0D Concussion without loss of consciousness, subsequent encounter: Secondary | ICD-10-CM

## 2021-11-25 DIAGNOSIS — I10 Essential (primary) hypertension: Secondary | ICD-10-CM | POA: Diagnosis not present

## 2021-11-25 LAB — GLUCOSE, POCT (MANUAL RESULT ENTRY): POC Glucose: 187 mg/dl — AB (ref 70–99)

## 2021-11-25 MED ORDER — LOSARTAN POTASSIUM 100 MG PO TABS: 100.0000 mg | ORAL_TABLET | Freq: Every day | ORAL | Status: DC

## 2021-11-25 NOTE — Patient Instructions (Signed)
To increase losartan to 100 mg daily Follow up in 3 days

## 2021-11-25 NOTE — Progress Notes (Signed)
Careteam: Patient Care Team: [name], NP as PCP - General (Geriatric Medicine) Melissa Montane, MD as Consulting Physician (Otolaryngology)  PLACE OF SERVICE:  [place]  Advanced Directive information    Allergies  Allergen Reactions   Mushroom Extract Complex Anaphylaxis and Swelling    Lips   Shrimp [Shellfish Allergy] Anaphylaxis   Tomato Anaphylaxis   Tylenol [Acetaminophen] Swelling    Chief Complaint  Patient presents with   Acute Visit    Blood pressure follow-up.      HPI: Patient is a 36 y.o. male for follow up on blood pressure and neuro exam.   Reports he is leaving for [place] soon. Family still here and so he plans to come visit. May follow up.   Reports blurred vision WITHOUT glasses, glasses correct vision. No worsening of headache. No slurred speech, numbness or tingling. Continues to have light sensitivity. Reports increase in pain to right leg therefore with difficulty moving. Denies weakness.   States he is in increased pain today- feels like this makes blood pressure worse. 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 Has taken his losartan today  Will get the rest of his insurance feb 1st- plans to refill farxiga at that time.   Review of Systems:  Review of Systems  Constitutional:  Negative for chills, fever and weight loss.  HENT:  Negative for tinnitus.   Respiratory:  Negative for cough, sputum production and shortness of breath.   Cardiovascular:  Negative for chest pain, palpitations and leg swelling.  Gastrointestinal:  Negative for abdominal pain, constipation, diarrhea and heartburn.  Genitourinary:  Negative for dysuria, frequency and urgency.  Musculoskeletal:  Positive for joint pain and myalgias. Negative for back pain and falls.  Skin: Negative.   Neurological:  Positive for headaches. Negative for dizziness, tingling, tremors, sensory change, speech change, focal weakness, seizures  and weakness.  Psychiatric/Behavioral:  The patient is nervous/anxious.    Past Medical History:  Diagnosis Date   Blood in stool    Diabetes mellitus without complication (HCC)    GERD (gastroesophageal reflux disease)    History of anal fissures    Low sperm motility    Past Surgical History:  Procedure Laterality Date   ANAL FISTULECTOMY  2012   with sphhincterotomy   ANAL FISTULOTOMY N/A 06/29/2015   Procedure: EXCISION PERI RECTAL SCAR/FISTULA, INTERNAL HEMMORRHOIDAL LIGATION, PEXY, MARSUPIALIZATION;  Surgeon: Michael Boston, MD;  Location: [facility];  Service: General;  Laterality: N/A;   EXAMINATION UNDER ANESTHESIA N/A 06/29/2015   Procedure: EXAM UNDER ANESTHESIA;  Surgeon: Michael Boston, MD;  Location: [facility];  Service: General;  Laterality: N/A;   KNEE ARTHROSCOPY W/ MENISCAL REPAIR  08/08/2019   KNEE SURGERY  07/07/2018   RECTAL SURGERY  08/2010   Dr Zenia Resides   Social History:   reports that he has been smoking cigarettes. He has never used smokeless tobacco. He reports current alcohol use. He reports that he does not use drugs.  Family History  Problem Relation Age of Onset   Diabetes Mother    Hypertension Mother    Throat cancer Maternal Aunt    Cirrhosis Maternal Grandfather    Colon cancer Neg Hx    Colon polyps Neg Hx    Kidney disease Neg Hx     Medications: Patient's Medications  New Prescriptions   No medications on file  Previous Medications   ALPRAZOLAM (XANAX) 1 MG TABLET    Take  1 tablet (1 mg total) by mouth daily as needed for anxiety.   ATORVASTATIN (LIPITOR) 20 MG TABLET    To take 1 tablet by mouth three times weekly in the evening.   CLOMIPHENE (CLOMID) 50 MG TABLET    clomiphene citrate 50 mg tablet  TAKE 1 TABLET BY MOUTH EVERY DAY   DAPAGLIFLOZIN PROPANEDIOL (FARXIGA) 5 MG TABS TABLET    Take 1 tablet (5 mg total) by mouth daily before breakfast.   LOSARTAN (COZAAR) 50 MG TABLET    Take 1 tablet (50 mg total) by mouth daily.   NICOTINE (NICODERM  CQ - DOSED IN MG/24 HOURS) 14 MG/24HR PATCH    Place 1 patch (14 mg total) onto the skin daily.   OXYCODONE (OXY-IR) 5 MG CAPSULE    Take 1-2 capsules (5-10 mg total) by mouth every 6 (six) hours as needed for pain.   SILDENAFIL (VIAGRA) 100 MG TABLET    Take 1 tablet (100 mg total) by mouth daily as needed for erectile dysfunction.  Modified Medications   No medications on file  Discontinued Medications   No medications on file    Physical Exam:  Vitals:   11/25/21 0857  BP: (!) 148/100  Pulse: 73  Temp: (!) 97.3 F (36.3 C)  TempSrc: Temporal  SpO2: 98%  Weight: (!) 344 lb (156 kg)  Height: 6\' 6"  (1.540 m)   Body mass index is 39.75 kg/m. Wt Readings from Last 3 Encounters:  11/25/21 (!) 344 lb (156 kg)  11/13/21 (!) 307 lb (139.3 kg)  10/10/21 (!) 342 lb (155.1 kg)    Physical Exam Constitutional:      General: He is not in acute distress.    Appearance: He is well-developed. He is not diaphoretic.  HENT:     Head: Normocephalic and atraumatic.     Right Ear: External ear normal.     Left Ear: External ear normal.     Mouth/Throat:     Pharynx: No oropharyngeal exudate.  Eyes:     Conjunctiva/sclera: Conjunctivae normal.     Pupils: Pupils are equal, round, and reactive to light.  Cardiovascular:     Rate and Rhythm: Normal rate and regular rhythm.     Heart sounds: Normal heart sounds.  Pulmonary:     Effort: Pulmonary effort is normal.     Breath sounds: Normal breath sounds.  Abdominal:     General: Bowel sounds are normal.     Palpations: Abdomen is soft.  Musculoskeletal:        General: No tenderness.     Cervical back: Normal range of motion and neck supple.     Right lower leg: No edema.     Left lower leg: No edema.  Skin:    General: Skin is warm and dry.  Neurological:     Mental Status: He is alert and oriented to person, place, and time. Mental status is at baseline.     Cranial Nerves: No cranial nerve deficit.     Sensory: No sensory  deficit.     Motor: No weakness.     Coordination: Coordination normal.     Gait: Gait normal.  Psychiatric:        Mood and Affect: Mood normal.    Labs reviewed: Basic Metabolic Panel: Recent Labs    05/13/21 1033 10/10/21 0841  NA 141 140  K 4.8 4.3  CL 106 104  CO2 28 27  GLUCOSE 132* 158*  BUN 13 9  CREATININE 1.19 0.97  CALCIUM 9.3 9.1   Liver Function Tests: Recent Labs    05/13/21 1033  AST 24  ALT 27  BILITOT 0.3  PROT 6.4   No results for input(s): LIPASE, AMYLASE in the last 8760 hours. No results for input(s): AMMONIA in the last 8760 hours. CBC: Recent Labs    05/13/21 1033 10/10/21 0841  WBC 5.6 5.8  NEUTROABS 3,231 2,906  HGB 14.0 14.1  HCT 44.7 43.5  MCV 80.5 79.7*  PLT 213 253   Lipid Panel: Recent Labs    01/19/21 0922 05/13/21 1033 10/10/21 0841  CHOL 137 123 133  HDL 46 41 54  LDLCALC 77 70 67  TRIG 67 50 43  CHOLHDL 3.0 3.0 2.5   TSH: No results for input(s): TSH in the last 8760 hours. A1C: Lab Results  Component Value Date   HGBA1C 8.3 (H) 10/10/2021     Assessment/Plan 1. Diabetes mellitus type 2 in obese [facility]) --discussed increase risk of heart disease and stroke with elevated/uncontrolled diabetes, samples given for farxiga and he will get filled next month once full insurance kicks in.  -dietary modifications also encouraged. -needs follow up a1c in 3 months - POC Glucose (CBG)  2. Concussion without loss of consciousness, subsequent encounter -neuro exam unremarkable. He is at baseline mental status at this time. Continues to have ongoing headache - Ambulatory referral to Neurology  3. Essential hypertension -blood pressure is not controlled, discussed importance of stroke reduction with proper blood pressure control. Dietary modifications encouraged as well as increasing mediation - losartan (COZAAR) 100 MG tablet; Take 1 tablet (100 mg total) by mouth daily.  4. Depression, recurrent ([place]) -stable at this  time.  5. Morbid obesity ([place]) -education provided on healthy weight loss through increase in physical activity and proper nutrition    Next appt: 5 days on blood pressure. Carlos American. [place], [facility] [phone]

## 2021-11-28 ENCOUNTER — Encounter: Payer: Self-pay | Admitting: Nurse Practitioner

## 2021-11-28 ENCOUNTER — Ambulatory Visit (INDEPENDENT_AMBULATORY_CARE_PROVIDER_SITE_OTHER): Payer: Medicare Other | Admitting: Nurse Practitioner

## 2021-11-28 ENCOUNTER — Other Ambulatory Visit: Payer: Self-pay

## 2021-11-28 VITALS — BP 138/90 | HR 72 | Temp 97.3°F | Ht 78.0 in

## 2021-11-28 DIAGNOSIS — E1169 Type 2 diabetes mellitus with other specified complication: Secondary | ICD-10-CM | POA: Diagnosis not present

## 2021-11-28 DIAGNOSIS — I10 Essential (primary) hypertension: Secondary | ICD-10-CM

## 2021-11-28 DIAGNOSIS — E669 Obesity, unspecified: Secondary | ICD-10-CM | POA: Diagnosis not present

## 2021-11-28 NOTE — Progress Notes (Signed)
Careteam: Patient Care Team: [name], NP as PCP - General (Geriatric Medicine) Melissa Montane, MD as Consulting Physician (Otolaryngology)  PLACE OF SERVICE:  [place] Directive information Does Patient Have a Medical Advance Directive?: No, Would patient like information on creating a medical advance directive?: No - Patient declined  Allergies  Allergen Reactions   Mushroom Extract Complex Anaphylaxis and Swelling    Lips   Shrimp [Shellfish Allergy] Anaphylaxis   Tomato Anaphylaxis   Tylenol [Acetaminophen] Swelling    Chief Complaint  Patient presents with   Follow-up    Blood pressure follow-up     HPI: Patient is a 36 y.o. male for follow up on blood pressure.  Blood pressure was elevated at follow up 3 days ago therefore losartan was increased to 100 mg daily from 50 mg daily.  Unable to take blood pressure at home.  Tolerating medication well at current dose.  Review of Systems:  Review of Systems  Constitutional:  Negative for chills, fever and weight loss.  HENT:  Negative for tinnitus.   Respiratory:  Negative for cough, sputum production and shortness of breath.   Cardiovascular:  Negative for chest pain, palpitations and leg swelling.  Gastrointestinal:  Negative for abdominal pain, constipation, diarrhea and heartburn.  Genitourinary:  Negative for dysuria, frequency and urgency.  Musculoskeletal:  Positive for joint pain.  Skin: Negative.   Neurological:  Negative for dizziness and headaches.   Past Medical History:  Diagnosis Date   Blood in stool    Diabetes mellitus without complication (HCC)    GERD (gastroesophageal reflux disease)    History of anal fissures    Low sperm motility    Past Surgical History:  Procedure Laterality Date   ANAL FISTULECTOMY  2012   with sphhincterotomy   ANAL FISTULOTOMY N/A 06/29/2015   Procedure: EXCISION PERI RECTAL SCAR/FISTULA, INTERNAL HEMMORRHOIDAL LIGATION, PEXY, MARSUPIALIZATION;   Surgeon: Michael Boston, MD;  Location: [facility];  Service: General;  Laterality: N/A;   EXAMINATION UNDER ANESTHESIA N/A 06/29/2015   Procedure: EXAM UNDER ANESTHESIA;  Surgeon: Michael Boston, MD;  Location: [facility];  Service: General;  Laterality: N/A;   KNEE ARTHROSCOPY W/ MENISCAL REPAIR  08/08/2019   KNEE SURGERY  07/07/2018   RECTAL SURGERY  08/2010   Dr Zenia Resides   Social History:   reports that he has been smoking cigarettes. He has never used smokeless tobacco. He reports current alcohol use. He reports that he does not use drugs.  Family History  Problem Relation Age of Onset   Diabetes Mother    Hypertension Mother    Throat cancer Maternal Aunt    Cirrhosis Maternal Grandfather    Colon cancer Neg Hx    Colon polyps Neg Hx    Kidney disease Neg Hx     Medications: Patient's Medications  New Prescriptions   No medications on file  Previous Medications   ALPRAZOLAM (XANAX) 1 MG TABLET    Take 1 tablet (1 mg total) by mouth daily as needed for anxiety.   ATORVASTATIN (LIPITOR) 20 MG TABLET    To take 1 tablet by mouth three times weekly in the evening.   CLOMIPHENE (CLOMID) 50 MG TABLET    clomiphene citrate 50 mg tablet  TAKE 1 TABLET BY MOUTH EVERY DAY   DAPAGLIFLOZIN PROPANEDIOL (FARXIGA) 5 MG TABS TABLET    Take 1 tablet (5 mg total) by mouth daily before breakfast.   LOSARTAN (COZAAR) 100 MG TABLET  Take 1 tablet (100 mg total) by mouth daily.   NICOTINE (NICODERM CQ - DOSED IN MG/24 HOURS) 14 MG/24HR PATCH    Place 1 patch (14 mg total) onto the skin daily.   OXYCODONE (OXY-IR) 5 MG CAPSULE    Take 1-2 capsules (5-10 mg total) by mouth every 6 (six) hours as needed for pain.   SILDENAFIL (VIAGRA) 100 MG TABLET    Take 1 tablet (100 mg total) by mouth daily as needed for erectile dysfunction.  Modified Medications   No medications on file  Discontinued Medications   No medications on file    Physical Exam:  Vitals:   11/28/21 0803  BP: 138/90  Pulse: 72  Temp: (!)  97.3 F (36.3 C)  TempSrc: Temporal  SpO2: 99%  Height: 6\' 6"  (1.981 m)   Body mass index is 39.75 kg/m. Wt Readings from Last 3 Encounters:  11/25/21 (!) 344 lb (156 kg)  11/13/21 (!) 307 lb (139.3 kg)  10/10/21 (!) 342 lb (155.1 kg)    Physical Exam Constitutional:      General: He is not in acute distress.    Appearance: He is well-developed. He is not diaphoretic.  HENT:     Head: Normocephalic and atraumatic.     Right Ear: External ear normal.     Left Ear: External ear normal.     Mouth/Throat:     Pharynx: No oropharyngeal exudate.  Eyes:     Conjunctiva/sclera: Conjunctivae normal.     Pupils: Pupils are equal, round, and reactive to light.  Cardiovascular:     Rate and Rhythm: Normal rate and regular rhythm.     Heart sounds: Normal heart sounds.  Pulmonary:     Effort: Pulmonary effort is normal.     Breath sounds: Normal breath sounds.  Musculoskeletal:        General: No tenderness.     Cervical back: Normal range of motion and neck supple.     Right lower leg: No edema.     Left lower leg: No edema.  Skin:    General: Skin is warm and dry.  Neurological:     Mental Status: He is alert and oriented to person, place, and time.    Labs reviewed: Basic Metabolic Panel: Recent Labs    05/13/21 1033 10/10/21 0841  NA 141 140  K 4.8 4.3  CL 106 104  CO2 28 27  GLUCOSE 132* 158*  BUN 13 9  CREATININE 1.19 0.97  CALCIUM 9.3 9.1   Liver Function Tests: Recent Labs    05/13/21 1033  AST 24  ALT 27  BILITOT 0.3  PROT 6.4   No results for input(s): LIPASE, AMYLASE in the last 8760 hours. No results for input(s): AMMONIA in the last 8760 hours. CBC: Recent Labs    05/13/21 1033 10/10/21 0841  WBC 5.6 5.8  NEUTROABS 3,231 2,906  HGB 14.0 14.1  HCT 44.7 43.5  MCV 80.5 79.7*  PLT 213 253   Lipid Panel: Recent Labs    01/19/21 0922 05/13/21 1033 10/10/21 0841  CHOL 137 123 133  HDL 46 41 54  LDLCALC 77 70 67  TRIG 67 50 43   CHOLHDL 3.0 3.0 2.5   TSH: No results for input(s): TSH in the last 8760 hours. A1C: Lab Results  Component Value Date   HGBA1C 8.3 (H) 10/10/2021     Assessment/Plan 1. Essential hypertension -blood pressure improved on losartan 100 mg daily. Will continue current dose and  plans to keep follow up in office despite moving to [place].   2. Diabetes mellitus type 2 in obese ([place]) Continues on samples of farxiga, plans to get medication at end of month when insurance is available.  To keep follow up   [place]. [place], [facility] [phone]

## 2021-12-15 ENCOUNTER — Telehealth: Payer: Self-pay | Admitting: *Deleted

## 2021-12-15 DIAGNOSIS — E1169 Type 2 diabetes mellitus with other specified complication: Secondary | ICD-10-CM

## 2021-12-15 DIAGNOSIS — E785 Hyperlipidemia, unspecified: Secondary | ICD-10-CM

## 2021-12-15 DIAGNOSIS — I10 Essential (primary) hypertension: Secondary | ICD-10-CM

## 2021-12-15 DIAGNOSIS — F17200 Nicotine dependence, unspecified, uncomplicated: Secondary | ICD-10-CM

## 2021-12-15 DIAGNOSIS — N529 Male erectile dysfunction, unspecified: Secondary | ICD-10-CM

## 2021-12-15 DIAGNOSIS — E669 Obesity, unspecified: Secondary | ICD-10-CM

## 2021-12-15 DIAGNOSIS — F419 Anxiety disorder, unspecified: Secondary | ICD-10-CM

## 2021-12-15 MED ORDER — ALPRAZOLAM 1 MG PO TABS: 1.0000 mg | ORAL_TABLET | Freq: Every day | ORAL | 0 refills | Status: DC | PRN

## 2021-12-15 MED ORDER — SILDENAFIL CITRATE 100 MG PO TABS: 100.0000 mg | ORAL_TABLET | Freq: Every day | ORAL | 3 refills | Status: DC | PRN

## 2021-12-15 MED ORDER — ATORVASTATIN CALCIUM 20 MG PO TABS: ORAL_TABLET | ORAL | 0 refills | Status: DC

## 2021-12-15 MED ORDER — DAPAGLIFLOZIN PROPANEDIOL 5 MG PO TABS: 5.0000 mg | ORAL_TABLET | Freq: Every day | ORAL | 0 refills | Status: DC

## 2021-12-15 MED ORDER — LOSARTAN POTASSIUM 100 MG PO TABS: 100.0000 mg | ORAL_TABLET | Freq: Every day | ORAL | 0 refills | Status: DC

## 2021-12-15 MED ORDER — NICOTINE 14 MG/24HR TD PT24: 14.0000 mg | MEDICATED_PATCH | Freq: Every day | TRANSDERMAL | 0 refills | Status: DC

## 2021-12-15 NOTE — Telephone Encounter (Signed)
Patient called and stated that he now has Insurance and requesting you to refill ALL of his Medications you prescribe before he moves to [place]. Stated that he wants them all refilled including the Alpraxolam and Nicoderm Patch.   Pended All Rx's and sent to [facility] for approval.

## 2021-12-15 NOTE — Telephone Encounter (Signed)
Orders sent

## 2021-12-16 ENCOUNTER — Other Ambulatory Visit: Payer: Self-pay | Admitting: Nurse Practitioner

## 2022-01-09 ENCOUNTER — Ambulatory Visit: Payer: BLUE CROSS/BLUE SHIELD | Admitting: Nurse Practitioner

## 2022-02-20 ENCOUNTER — Ambulatory Visit (INDEPENDENT_AMBULATORY_CARE_PROVIDER_SITE_OTHER): Payer: Medicare Other | Admitting: Nurse Practitioner

## 2022-02-20 ENCOUNTER — Encounter: Payer: Self-pay | Admitting: Nurse Practitioner

## 2022-02-20 VITALS — BP 142/98 | HR 90 | Temp 97.1°F | Ht 78.0 in | Wt 344.0 lb

## 2022-02-20 DIAGNOSIS — F17200 Nicotine dependence, unspecified, uncomplicated: Secondary | ICD-10-CM | POA: Diagnosis not present

## 2022-02-20 DIAGNOSIS — E785 Hyperlipidemia, unspecified: Secondary | ICD-10-CM

## 2022-02-20 DIAGNOSIS — F419 Anxiety disorder, unspecified: Secondary | ICD-10-CM

## 2022-02-20 DIAGNOSIS — I10 Essential (primary) hypertension: Secondary | ICD-10-CM | POA: Diagnosis not present

## 2022-02-20 DIAGNOSIS — E1169 Type 2 diabetes mellitus with other specified complication: Secondary | ICD-10-CM | POA: Diagnosis not present

## 2022-02-20 DIAGNOSIS — E669 Obesity, unspecified: Secondary | ICD-10-CM

## 2022-02-20 DIAGNOSIS — N529 Male erectile dysfunction, unspecified: Secondary | ICD-10-CM

## 2022-02-20 MED ORDER — LOSARTAN POTASSIUM 100 MG PO TABS: 100.0000 mg | ORAL_TABLET | Freq: Every day | ORAL | 0 refills | Status: DC

## 2022-02-20 MED ORDER — ATORVASTATIN CALCIUM 20 MG PO TABS: ORAL_TABLET | ORAL | 0 refills | Status: DC

## 2022-02-20 MED ORDER — SILDENAFIL CITRATE 100 MG PO TABS: 100.0000 mg | ORAL_TABLET | Freq: Every day | ORAL | 3 refills | Status: DC | PRN

## 2022-02-20 MED ORDER — ALPRAZOLAM 1 MG PO TABS: 1.0000 mg | ORAL_TABLET | Freq: Every day | ORAL | 0 refills | Status: DC | PRN

## 2022-02-20 MED ORDER — DAPAGLIFLOZIN PROPANEDIOL 5 MG PO TABS: 5.0000 mg | ORAL_TABLET | Freq: Every day | ORAL | 0 refills | Status: DC

## 2022-02-20 NOTE — Addendum Note (Signed)
Addended by: Logan Bores on: 02/20/2022 02:37 PM ? ? Modules accepted: Orders ? ?

## 2022-02-20 NOTE — Addendum Note (Signed)
Addended by: [name] on: 02/20/2022 02:51 PM ? ? Modules accepted: Orders ? ?

## 2022-02-20 NOTE — Progress Notes (Signed)
? ? ?Careteam: ?Patient Care Team: ?[name], NP as PCP - General (Geriatric Medicine) ?Melissa Montane, MD as Consulting Physician (Otolaryngology) ? ?PLACE OF SERVICE:  ?[facility]  ?Advanced Directive information ?Does Patient Have a Medical Advance Directive?: No, Would patient like information on creating a medical advance directive?: No - Patient declined ? ?Allergies  ?Allergen Reactions  ? Mushroom Extract Complex Anaphylaxis and Swelling  ?  Lips  ? Shrimp [Shellfish Allergy] Anaphylaxis  ? Tomato Anaphylaxis  ? Tylenol [Acetaminophen] Swelling  ? ? ?Chief Complaint  ?Patient presents with  ? Medical Management of Chronic Issues  ?  Routine visit. Foot exam.   ? ? ? ?HPI: Patient is a 36 y.o. male for routine follow up.  ? ?Has oxycodone for severe pain- has 2 tablets left. Only uses when he has to for knee pain.  ?Overall this has been well controlled.  ? ?Hypertension- took losartan 100 mg daily, reports he ate some chips earlier today. Overall reports blood pressure is much better when checked outside of office. Reports sbp generally closer to 130s.  ? ?Trying to eat better and lose weight.  ? ?No acute concerns today ? ?Review of Systems:  ?Review of Systems  ?Constitutional:  Negative for chills, fever and weight loss.  ?HENT:  Negative for tinnitus.   ?Respiratory:  Negative for cough, sputum production and shortness of breath.   ?Cardiovascular:  Negative for chest pain, palpitations and leg swelling.  ?Gastrointestinal:  Negative for abdominal pain, constipation, diarrhea and heartburn.  ?Genitourinary:  Negative for dysuria, frequency and urgency.  ?Musculoskeletal:  Negative for back pain, falls, joint pain and myalgias.  ?Skin: Negative.   ?Neurological:  Negative for dizziness and headaches.  ?Psychiatric/Behavioral:  Negative for depression and memory loss. The patient does not have insomnia.   ? ?Past Medical History:  ?Diagnosis Date  ? Blood in stool   ? Diabetes mellitus without  complication ([place])   ? GERD (gastroesophageal reflux disease)   ? History of anal fissures   ? Low sperm motility   ? ?Past Surgical History:  ?Procedure Laterality Date  ? ANAL FISTULECTOMY  2012  ? with sphhincterotomy  ? ANAL FISTULOTOMY N/A 06/29/2015  ? Procedure: EXCISION PERI RECTAL SCAR/FISTULA, INTERNAL HEMMORRHOIDAL LIGATION, PEXY, MARSUPIALIZATION;  Surgeon: Michael Boston, MD;  Location: [facility];  Service: General;  Laterality: N/A;  ? EXAMINATION UNDER ANESTHESIA N/A 06/29/2015  ? Procedure: EXAM UNDER ANESTHESIA;  Surgeon: Michael Boston, MD;  Location: [facility];  Service: General;  Laterality: N/A;  ? KNEE ARTHROSCOPY W/ MENISCAL REPAIR  08/08/2019  ? KNEE SURGERY  07/07/2018  ? RECTAL SURGERY  08/2010  ? Dr Zenia Resides  ? ?Social History: ?  reports that he has been smoking cigarettes. He has never used smokeless tobacco. He reports current alcohol use. He reports that he does not use drugs. ? ?Family History  ?Problem Relation Age of Onset  ? Diabetes Mother   ? Hypertension Mother   ? Throat cancer Maternal Aunt   ? Cirrhosis Maternal Grandfather   ? Colon cancer Neg Hx   ? Colon polyps Neg Hx   ? Kidney disease Neg Hx   ? ? ?Medications: ?Patient's Medications  ?New Prescriptions  ? No medications on file  ?Previous Medications  ? ALPRAZOLAM (XANAX) 1 MG TABLET    Take 1 tablet (1 mg total) by mouth daily as needed for anxiety.  ? ATORVASTATIN (LIPITOR) 20 MG TABLET    To take 1 tablet by  mouth three times weekly in the evening.  ? CLOMIPHENE (CLOMID) 50 MG TABLET    clomiphene citrate 50 mg tablet ? TAKE 1 TABLET BY MOUTH EVERY DAY  ? DAPAGLIFLOZIN PROPANEDIOL (FARXIGA) 5 MG TABS TABLET    Take 1 tablet (5 mg total) by mouth daily before breakfast.  ? LOSARTAN (COZAAR) 100 MG TABLET    Take 1 tablet (100 mg total) by mouth daily.  ? NICOTINE (NICODERM CQ - DOSED IN MG/24 HOURS) 14 MG/24HR PATCH    Place 1 patch (14 mg total) onto the skin daily.  ? OXYCODONE (OXY-IR) 5 MG CAPSULE    Take 1-2 capsules (5-10 mg  total) by mouth every 6 (six) hours as needed for pain.  ? SILDENAFIL (VIAGRA) 100 MG TABLET    Take 1 tablet (100 mg total) by mouth daily as needed for erectile dysfunction.  ?Modified Medications  ? No medications on file  ?Discontinued Medications  ? No medications on file  ? ? ?Physical Exam: ? ?Vitals:  ? 02/20/22 1402  ?BP: (!) 142/98  ?Pulse: 90  ?Temp: (!) 97.1 ?F (36.2 ?C)  ?TempSrc: Temporal  ?SpO2: 98%  ?Weight: (!) 344 lb (156 kg)  ?Height: _0  (1.981 m)  ? ?Body mass index is 39.75 kg/m?. ?Wt Readings from Last 3 Encounters:  ?02/20/22 (!) 344 lb (156 kg)  ?11/25/21 (!) 344 lb (156 kg)  ?11/13/21 (!) 307 lb (139.3 kg)  ? ? ?Physical Exam ?Constitutional:   ?   General: He is not in acute distress. ?   Appearance: He is well-developed. He is obese. He is not diaphoretic.  ?HENT:  ?   Head: Normocephalic and atraumatic.  ?   Right Ear: External ear normal.  ?   Left Ear: External ear normal.  ?   Mouth/Throat:  ?   Pharynx: No oropharyngeal exudate.  ?Eyes:  ?   Conjunctiva/sclera: Conjunctivae normal.  ?   Pupils: Pupils are equal, round, and reactive to light.  ?Cardiovascular:  ?   Rate and Rhythm: Normal rate and regular rhythm.  ?   Heart sounds: Normal heart sounds.  ?Pulmonary:  ?   Effort: Pulmonary effort is normal.  ?   Breath sounds: Normal breath sounds.  ?Abdominal:  ?   General: Bowel sounds are normal.  ?   Palpations: Abdomen is soft.  ?Musculoskeletal:     ?   General: No tenderness.  ?   Cervical back: Normal range of motion and neck supple.  ?   Right lower leg: No edema.  ?   Left lower leg: No edema.  ?Skin: ?   General: Skin is warm and dry.  ?Neurological:  ?   Mental Status: He is alert and oriented to person, place, and time.  ?Psychiatric:     ?   Mood and Affect: Mood normal.  ? ? ?Labs reviewed: ?Basic Metabolic Panel: ?Recent Labs  ?  05/13/21 ?1033 10/10/21 ?0841  ?NA 141 140  ?K 4.8 4.3  ?CL 106 104  ?CO2 28 27  ?GLUCOSE 132* 158*  ?BUN 13 9  ?CREATININE 1.19 0.97   ?CALCIUM 9.3 9.1  ? ?Liver Function Tests: ?Recent Labs  ?  05/13/21 ?1033  ?AST 24  ?ALT 27  ?BILITOT 0.3  ?PROT 6.4  ? ?No results for input(s): LIPASE, AMYLASE in the last 8760 hours. ?No results for input(s): AMMONIA in the last 8760 hours. ?CBC: ?Recent Labs  ?  05/13/21 ?1033 10/10/21 ?0841  ?WBC 5.6 5.8  ?  NEUTROABS 3,231 2,906  ?HGB 14.0 14.1  ?HCT 44.7 43.5  ?MCV 80.5 79.7*  ?PLT 213 253  ? ?Lipid Panel: ?Recent Labs  ?  05/13/21 ?1033 10/10/21 ?0841  ?CHOL 123 133  ?HDL 41 54  ?[place] 70 67  ?TRIG 50 43  ?CHOLHDL 3.0 2.5  ? ?TSH: ?No results for input(s): TSH in the last 8760 hours. ?A1C: ?Lab Results  ?Component Value Date  ? HGBA1C 8.3 (H) 10/10/2021  ? ? ? ?1. Hyperlipidemia, unspecified hyperlipidemia type ?-continue with dietary modifications and lipitor ?- CMP with eGFR([facility]) ?- atorvastatin (LIPITOR) 20 MG tablet; To take 1 tablet by mouth three times weekly in the evening.  Dispense: 90 tablet; Refill: 0 ? ?2. Diabetes mellitus type 2 in obese [facility]) ?-Encouraged dietary compliance, routine foot care/monitoring and to keep up with diabetic eye exams through ophthalmology  ?-continue on farxiga ?- Hemoglobin A1c ?- CMP with eGFR([facility]) ?- dapagliflozin propanediol (FARXIGA) 5 MG TABS tablet; Take 1 tablet (5 mg total) by mouth daily before breakfast.  Dispense: 90 tablet; Refill: 0 ? ?3. Essential hypertension ?--Blood pressure elevated today but he reports well controlled outside of office ?-No changes to medications today, continue dietary modifications.  ?-will have pt continue to monitor home bp goal <140/90, to notify if readings remain high on 3 different days  ?-follow metabolic panel ?- CBC with Differential/Platelet ?- CMP with eGFR([facility]) ?- losartan (COZAAR) 100 MG tablet; Take 1 tablet (100 mg total) by mouth daily.  Dispense: 90 tablet; Refill: 0 ? ?4. Smoker ?Not smoking at this time.  ? ?5. Anxiety ?Well controlled at this time ? ? ?To follow up in ~4 months.  ?Carlos American. Dewaine Oats,  AGNP ? ?[facility] ?[phone]  ?

## 2022-02-21 LAB — CBC WITH DIFFERENTIAL/PLATELET
Absolute Monocytes: 505 cells/uL (ref 200–950)
Basophils Absolute: 17 cells/uL (ref 0–200)
Basophils Relative: 0.3 %
Eosinophils Absolute: 41 cells/uL (ref 15–500)
Eosinophils Relative: 0.7 %
HCT: 42.5 % (ref 38.5–50.0)
Hemoglobin: 14 g/dL (ref 13.2–17.1)
Lymphs Abs: 2204 cells/uL (ref 850–3900)
MCH: 26 pg — ABNORMAL LOW (ref 27.0–33.0)
MCHC: 32.9 g/dL (ref 32.0–36.0)
MCV: 79 fL — ABNORMAL LOW (ref 80.0–100.0)
MPV: 11.4 fL (ref 7.5–12.5)
Monocytes Relative: 8.7 %
Neutro Abs: 3033 cells/uL (ref 1500–7800)
Neutrophils Relative %: 52.3 %
Platelets: 225 10*3/uL (ref 140–400)
RBC: 5.38 10*6/uL (ref 4.20–5.80)
RDW: 13.1 % (ref 11.0–15.0)
Total Lymphocyte: 38 %
WBC: 5.8 10*3/uL (ref 3.8–10.8)

## 2022-02-21 LAB — HEMOGLOBIN A1C
Hgb A1c MFr Bld: 8.5 % of total Hgb — ABNORMAL HIGH (ref <5.7)
Mean Plasma Glucose: 197 mg/dL
eAG (mmol/L): 10.9 mmol/L

## 2022-02-21 LAB — COMPLETE METABOLIC PANEL WITH GFR
AG Ratio: 1.6 (calc) (ref 1.0–2.5)
ALT: 35 U/L (ref 9–46)
AST: 30 U/L (ref 10–40)
Albumin: 4.2 g/dL (ref 3.6–5.1)
Alkaline phosphatase (APISO): 90 U/L (ref 36–130)
BUN: 13 mg/dL (ref 7–25)
CO2: 25 mmol/L (ref 20–32)
Calcium: 9.6 mg/dL (ref 8.6–10.3)
Chloride: 107 mmol/L (ref 98–110)
Creat: 1.08 mg/dL (ref 0.60–1.26)
Globulin: 2.7 g/dL (calc) (ref 1.9–3.7)
Glucose, Bld: 153 mg/dL — ABNORMAL HIGH (ref 65–139)
Potassium: 4.2 mmol/L (ref 3.5–5.3)
Sodium: 138 mmol/L (ref 135–146)
Total Bilirubin: 0.5 mg/dL (ref 0.2–1.2)
Total Protein: 6.9 g/dL (ref 6.1–8.1)
eGFR: 92 mL/min/{1.73_m2} (ref >=60)

## 2022-02-23 ENCOUNTER — Other Ambulatory Visit: Payer: Self-pay | Admitting: Nurse Practitioner

## 2022-02-23 DIAGNOSIS — E669 Obesity, unspecified: Secondary | ICD-10-CM

## 2022-02-23 MED ORDER — OZEMPIC (0.25 OR 0.5 MG/DOSE) 2 MG/1.5ML ~~LOC~~ SOPN: PEN_INJECTOR | SUBCUTANEOUS | 1 refills | Status: DC

## 2022-02-23 NOTE — Progress Notes (Signed)
To start ozempic  0.25 mg [place] injection weekly for 4 weeks then increase to 0.5 mg  injection weekly. To follow up A1c prior to next appt to discuss.  ?Please schedule lab and 3 month follow up ?

## 2022-02-27 ENCOUNTER — Telehealth: Payer: Self-pay

## 2022-02-27 NOTE — Telephone Encounter (Signed)
Received fax from Jonathan Taylor stating that [place] was APPROVED from 02/27/2022-02/28/2023 ? ?[id number] ?Patient ID: [id number] ? ?Patient aware and will call pharmacy.  ?

## 2022-02-27 NOTE — Telephone Encounter (Signed)
Incoming call received from patient stating it has been 4-5 days and the pharmacy states they are waiting on Prior Authorization for [place]. ? ?Prior Authorization initiated via covermymeds ? ?KEY: [id number] ? ?Waiting for determination from [facility] (can take 48-72 hours)  ? ?Patient was called back and provided a status update.  ? ? ? ? ?

## 2022-03-17 ENCOUNTER — Other Ambulatory Visit: Payer: Self-pay

## 2022-03-17 DIAGNOSIS — N529 Male erectile dysfunction, unspecified: Secondary | ICD-10-CM

## 2022-03-17 MED ORDER — SILDENAFIL CITRATE 100 MG PO TABS: 100.0000 mg | ORAL_TABLET | Freq: Every day | ORAL | 3 refills | Status: DC | PRN

## 2022-04-24 ENCOUNTER — Other Ambulatory Visit: Payer: Self-pay

## 2022-04-24 DIAGNOSIS — E669 Obesity, unspecified: Secondary | ICD-10-CM

## 2022-04-24 DIAGNOSIS — N529 Male erectile dysfunction, unspecified: Secondary | ICD-10-CM

## 2022-04-24 DIAGNOSIS — F419 Anxiety disorder, unspecified: Secondary | ICD-10-CM

## 2022-04-24 DIAGNOSIS — I10 Essential (primary) hypertension: Secondary | ICD-10-CM

## 2022-04-24 DIAGNOSIS — E785 Hyperlipidemia, unspecified: Secondary | ICD-10-CM

## 2022-04-24 MED ORDER — LOSARTAN POTASSIUM 100 MG PO TABS: 100.0000 mg | ORAL_TABLET | Freq: Every day | ORAL | 0 refills | Status: DC

## 2022-04-24 MED ORDER — OZEMPIC (0.25 OR 0.5 MG/DOSE) 2 MG/1.5ML ~~LOC~~ SOPN: PEN_INJECTOR | SUBCUTANEOUS | 1 refills | Status: DC

## 2022-04-24 MED ORDER — ATORVASTATIN CALCIUM 20 MG PO TABS: ORAL_TABLET | ORAL | 0 refills | Status: DC

## 2022-04-24 MED ORDER — SILDENAFIL CITRATE 100 MG PO TABS: 100.0000 mg | ORAL_TABLET | Freq: Every day | ORAL | 0 refills | Status: DC | PRN

## 2022-04-24 MED ORDER — DAPAGLIFLOZIN PROPANEDIOL 5 MG PO TABS: 5.0000 mg | ORAL_TABLET | Freq: Every day | ORAL | 0 refills | Status: DC

## 2022-04-24 NOTE — Telephone Encounter (Signed)
Patient called stating he plans to return to [place] on Friday and would like a rx sent to the local pharmacy for all of his medications.  Xanax was last refilled on 02/20/22, no treatment agreement on file and no future appointments.

## 2022-04-25 ENCOUNTER — Encounter: Payer: Self-pay | Admitting: Nurse Practitioner

## 2022-04-25 ENCOUNTER — Telehealth: Payer: Self-pay | Admitting: *Deleted

## 2022-04-25 ENCOUNTER — Telehealth (INDEPENDENT_AMBULATORY_CARE_PROVIDER_SITE_OTHER): Payer: Medicare Other | Admitting: Nurse Practitioner

## 2022-04-25 DIAGNOSIS — E669 Obesity, unspecified: Secondary | ICD-10-CM | POA: Diagnosis not present

## 2022-04-25 DIAGNOSIS — F419 Anxiety disorder, unspecified: Secondary | ICD-10-CM | POA: Diagnosis not present

## 2022-04-25 DIAGNOSIS — E1169 Type 2 diabetes mellitus with other specified complication: Secondary | ICD-10-CM

## 2022-04-25 MED ORDER — ALPRAZOLAM 1 MG PO TABS: 1.0000 mg | ORAL_TABLET | Freq: Every day | ORAL | 0 refills | Status: DC | PRN

## 2022-04-25 NOTE — Progress Notes (Signed)
   This service is provided via telemedicine  No vital signs collected/recorded due to the encounter was a telemedicine visit.   Location of patient (ex: home, work):  Home  Patient consents to a telephone visit: Yes, see telephone visit dated 04/25/22  Location of the provider (ex: office, home):  [place], [place]   Name of any referring provider:  N/A  Names of all persons participating in the telemedicine service and their role in the encounter:  Rodena Piety Sherial Ebrahim,CMA, Sherrie Mustache, NP, and Patient   Time spent on call:  7 min with medical assistant

## 2022-04-25 NOTE — Telephone Encounter (Signed)
Jonathan Taylor, odor are scheduled for a virtual visit with your provider today.    Just as we do with appointments in the office, we must obtain your consent to participate.  Your consent will be active for this visit and any virtual visit you Jonathan Taylor have with one of our providers in the next 365 days.    If you have a MyChart account, I can also send a copy of this consent to you electronically.  All virtual visits are billed to your insurance company just like a traditional visit in the office.  As this is a virtual visit, video technology does not allow for your provider to perform a traditional examination.  This Jonathan Taylor limit your provider's ability to fully assess your condition.  If your provider identifies any concerns that need to be evaluated in person or the need to arrange testing such as labs, EKG, etc, we will make arrangements to do so.    Although advances in technology are sophisticated, we cannot ensure that it will always work on either your end or our end.  If the connection with a video visit is poor, we Jonathan Taylor have to switch to a telephone visit.  With either a video or telephone visit, we are not always able to ensure that we have a secure connection.   I need to obtain your verbal consent now.   Are you willing to proceed with your visit today?   Jonathan Taylor has provided verbal consent on 04/25/2022 for a virtual visit (video or telephone).   Jonathan Taylor, [place] 04/25/2022  9:43 AM

## 2022-04-25 NOTE — Progress Notes (Signed)
Careteam: Patient Care Team: [name], NP as PCP - General (Geriatric Medicine) Melissa Montane, MD as Consulting Physician (Otolaryngology)  Advanced Directive information    Allergies  Allergen Reactions   Mushroom Extract Complex Anaphylaxis and Swelling    Lips   Shrimp [Shellfish Allergy] Anaphylaxis   Tomato Anaphylaxis   Tylenol [Acetaminophen] Swelling    Chief Complaint  Patient presents with   Acute Visit    Discuss medication (xanax) and sign treatment agreement. Visit performed via telephone/video visit. Getting ready to move back to [place]     HPI: Patient is a 36 y.o. male to discuss medication.  Reports he is using xanax about 2-3 times a week. His father had a stroke so he is helping with his mom and getting his father straight in rehab. Having increase in anxiety and panic due to this but feels like once he moves this will improve. Does not feel like he needs something every day.   He is going back out to [place] and will be there fore a few months before coming back home.   Tolerating ozemepic well. He has lost 10 lbs. Continue to work on dietary modifications. Blood sugars 110-113, 133.  Has stopped farxiga.    Review of Systems:  Review of Systems  Constitutional:  Negative for chills, fever and weight loss.  HENT:  Negative for tinnitus.   Respiratory:  Negative for cough, sputum production and shortness of breath.   Cardiovascular:  Negative for chest pain, palpitations and leg swelling.  Gastrointestinal:  Negative for abdominal pain, constipation, diarrhea and heartburn.  Genitourinary:  Negative for dysuria, frequency and urgency.  Musculoskeletal:  Negative for back pain, falls, joint pain and myalgias.  Skin: Negative.   Neurological:  Negative for dizziness and headaches.  Psychiatric/Behavioral:  Negative for depression and memory loss. The patient does not have insomnia.     Past Medical History:  Diagnosis Date   Blood in stool     Diabetes mellitus without complication (HCC)    GERD (gastroesophageal reflux disease)    History of anal fissures    Low sperm motility    Past Surgical History:  Procedure Laterality Date   ANAL FISTULECTOMY  2012   with sphhincterotomy   ANAL FISTULOTOMY N/A 06/29/2015   Procedure: EXCISION PERI RECTAL SCAR/FISTULA, INTERNAL HEMMORRHOIDAL LIGATION, PEXY, MARSUPIALIZATION;  Surgeon: Michael Boston, MD;  Location: [facility];  Service: General;  Laterality: N/A;   EXAMINATION UNDER ANESTHESIA N/A 06/29/2015   Procedure: EXAM UNDER ANESTHESIA;  Surgeon: Michael Boston, MD;  Location: [facility];  Service: General;  Laterality: N/A;   KNEE ARTHROSCOPY W/ MENISCAL REPAIR  08/08/2019   KNEE SURGERY  07/07/2018   RECTAL SURGERY  08/2010   Dr Zenia Resides   Social History:   reports that he has been smoking cigarettes. He has never used smokeless tobacco. He reports current alcohol use. He reports that he does not use drugs.  Family History  Problem Relation Age of Onset   Diabetes Mother    Hypertension Mother    Throat cancer Maternal Aunt    Cirrhosis Maternal Grandfather    Colon cancer Neg Hx    Colon polyps Neg Hx    Kidney disease Neg Hx     Medications: Patient's Medications  New Prescriptions   No medications on file  Previous Medications   ALPRAZOLAM (XANAX) 1 MG TABLET    Take 1 tablet (1 mg total) by mouth daily as needed for anxiety.  ATORVASTATIN (LIPITOR) 20 MG TABLET    To take 1 tablet by mouth three times weekly in the evening.   CLOMIPHENE (CLOMID) 50 MG TABLET    clomiphene citrate 50 mg tablet  TAKE 1 TABLET BY MOUTH EVERY DAY   DAPAGLIFLOZIN PROPANEDIOL (FARXIGA) 5 MG TABS TABLET    Take 1 tablet (5 mg total) by mouth daily before breakfast.   LOSARTAN (COZAAR) 100 MG TABLET    Take 1 tablet (100 mg total) by mouth daily.   NICOTINE (NICODERM CQ - DOSED IN MG/24 HOURS) 14 MG/24HR PATCH    Place 1 patch (14 mg total) onto the skin daily.   OXYCODONE (OXY-IR) 5 MG CAPSULE     Take 1-2 capsules (5-10 mg total) by mouth every 6 (six) hours as needed for pain.   SEMAGLUTIDE,0.25 OR 0.'5MG'$ /DOS, (OZEMPIC, 0.25 OR 0.5 MG/DOSE,) 2 MG/1.5ML SOPN    Injection 0.25 mg into skin weekly for 4 weeks then increase to 0.5 mg weekly into skin for diabetes.   SILDENAFIL (VIAGRA) 100 MG TABLET    Take 1 tablet (100 mg total) by mouth daily as needed for erectile dysfunction.  Modified Medications   No medications on file  Discontinued Medications   No medications on file    Physical Exam:  There were no vitals filed for this visit. There is no height or weight on file to calculate BMI. Wt Readings from Last 3 Encounters:  02/20/22 (!) 344 lb (156 kg)  11/25/21 (!) 344 lb (156 kg)  11/13/21 (!) 307 lb (139.3 kg)    Physical Exam Constitutional:      Appearance: Normal appearance.  Neurological:     Mental Status: He is alert. Mental status is at baseline.  Psychiatric:        Mood and Affect: Mood normal.     Labs reviewed: Basic Metabolic Panel: Recent Labs    05/13/21 1033 10/10/21 0841 02/20/22 1419  NA 141 140 138  K 4.8 4.3 4.2  CL 106 104 107  CO2 '28 27 25  '$ GLUCOSE 132* 158* 153*  BUN '13 9 13  '$ CREATININE 1.19 0.97 1.08  CALCIUM 9.3 9.1 9.6   Liver Function Tests: Recent Labs    05/13/21 1033 02/20/22 1419  AST 24 30  ALT 27 35  BILITOT 0.3 0.5  PROT 6.4 6.9   No results for input(s): "LIPASE", "AMYLASE" in the last 8760 hours. No results for input(s): "AMMONIA" in the last 8760 hours. CBC: Recent Labs    05/13/21 1033 10/10/21 0841 02/20/22 1419  WBC 5.6 5.8 5.8  NEUTROABS 3,231 2,906 3,033  HGB 14.0 14.1 14.0  HCT 44.7 43.5 42.5  MCV 80.5 79.7* 79.0*  PLT 213 253 225   Lipid Panel: Recent Labs    05/13/21 1033 10/10/21 0841  CHOL 123 133  HDL 41 54  LDLCALC 70 67  TRIG 50 43  CHOLHDL 3.0 2.5   TSH: No results for input(s): "TSH" in the last 8760 hours. A1C: Lab Results  Component Value Date   HGBA1C 8.5 (H)  02/20/2022     Assessment/Plan 1. Anxiety -will refill xanax- he plans to go by office and sign treatment agreement. -if he feels like anxiety worsening to follow up, xanax is not meant to be used daily just as needed for increase in anxiety and panic. 30 tablets should last 90 days, if only taking a few times a week.   2. Diabetes mellitus type 2 in obese ([place]) -to restart farxiga, should  be taking with ozempic for better glycemic control.  Goal fasting blood sugars <100.  He will attempt to get provider out in [place] but will also be making frequent trips home. If he has not found provider before his next trip home he will follow up in office for labs and refills -Encouraged dietary compliance, routine foot care/monitoring and to keep up with diabetic eye exams through ophthalmology    Darneisha Windhorst K. Harle Battiest  [facility] [phone]    Virtual Visit via Deloris Ping  I connected with patient on 04/25/22 at 10:00 AM EDT by video and verified that I am speaking with the correct person using two identifiers.  Location: Patient: home Provider: [facility]    I discussed the limitations, risks, security and privacy concerns of performing an evaluation and management service by telephone and the availability of in person appointments. I also discussed with the patient that there may be a patient responsible charge related to this service. The patient expressed understanding and agreed to proceed.   I discussed the assessment and treatment plan with the patient. The patient was provided an opportunity to ask questions and all were answered. The patient agreed with the plan and demonstrated an understanding of the instructions.   The patient was advised to call back or seek an in-person evaluation if the symptoms worsen or if the condition fails to improve as anticipated.  I provided 18 minutes of non-face-to-face time during this encounter.  Carlos American.  Harle Battiest Avs printed and mailed

## 2022-05-26 ENCOUNTER — Other Ambulatory Visit: Payer: Self-pay

## 2022-05-26 DIAGNOSIS — E1169 Type 2 diabetes mellitus with other specified complication: Secondary | ICD-10-CM

## 2022-05-26 MED ORDER — OZEMPIC (0.25 OR 0.5 MG/DOSE) 2 MG/1.5ML ~~LOC~~ SOPN: PEN_INJECTOR | SUBCUTANEOUS | 1 refills | Status: DC

## 2022-05-26 NOTE — Telephone Encounter (Signed)
Patient called and request refill on medication Ozempic. Medication pend and sent to PCP [name], NP to update frequency/dose.

## 2022-05-31 NOTE — Telephone Encounter (Signed)
Medication refilled by PCP Eubanks, Jessica K, NP  

## 2022-06-08 ENCOUNTER — Encounter: Payer: Self-pay | Admitting: Nurse Practitioner

## 2022-08-25 ENCOUNTER — Ambulatory Visit: Payer: Medicare Other | Admitting: Adult Health

## 2022-08-25 ENCOUNTER — Ambulatory Visit (INDEPENDENT_AMBULATORY_CARE_PROVIDER_SITE_OTHER): Payer: 59 | Admitting: Adult Health

## 2022-08-25 ENCOUNTER — Encounter: Payer: Self-pay | Admitting: Adult Health

## 2022-08-25 VITALS — BP 140/86 | HR 86 | Temp 98.0°F | Resp 18 | Ht 78.0 in | Wt 325.0 lb

## 2022-08-25 DIAGNOSIS — I10 Essential (primary) hypertension: Secondary | ICD-10-CM

## 2022-08-25 DIAGNOSIS — F419 Anxiety disorder, unspecified: Secondary | ICD-10-CM | POA: Diagnosis not present

## 2022-08-25 DIAGNOSIS — E785 Hyperlipidemia, unspecified: Secondary | ICD-10-CM

## 2022-08-25 DIAGNOSIS — E1169 Type 2 diabetes mellitus with other specified complication: Secondary | ICD-10-CM

## 2022-08-25 DIAGNOSIS — E669 Obesity, unspecified: Secondary | ICD-10-CM

## 2022-08-25 MED ORDER — LOSARTAN POTASSIUM 100 MG PO TABS: 100.0000 mg | ORAL_TABLET | Freq: Every day | ORAL | 3 refills | Status: DC

## 2022-08-25 MED ORDER — OZEMPIC (0.25 OR 0.5 MG/DOSE) 2 MG/1.5ML ~~LOC~~ SOPN: 0.5000 mg | PEN_INJECTOR | SUBCUTANEOUS | 3 refills | Status: DC

## 2022-08-25 MED ORDER — ALPRAZOLAM 1 MG PO TABS: 1.0000 mg | ORAL_TABLET | Freq: Every day | ORAL | 0 refills | Status: DC | PRN

## 2022-08-25 MED ORDER — DAPAGLIFLOZIN PROPANEDIOL 5 MG PO TABS: 5.0000 mg | ORAL_TABLET | Freq: Every day | ORAL | 3 refills | Status: DC

## 2022-08-25 MED ORDER — ATORVASTATIN CALCIUM 20 MG PO TABS: ORAL_TABLET | ORAL | 3 refills | Status: DC

## 2022-08-25 NOTE — Progress Notes (Unsigned)
[facility] clinic  Provider:   Code Status: *** Goals of Care:     02/20/2022    2:06 PM  Advanced Directives  Does Patient Have a Medical Advance Directive? No  Would patient like information on creating a medical advance directive? No - Patient declined     Chief Complaint  Patient presents with   Medical Management of Chronic Issues    Patient is here for F/U and medication refill Patient needs 90 day supply    HPI: Patient is a 36 y.o. male seen today for an acute visit for  Past Medical History:  Diagnosis Date   Blood in stool    Diabetes mellitus without complication ([place])    GERD (gastroesophageal reflux disease)    History of anal fissures    Low sperm motility     Past Surgical History:  Procedure Laterality Date   ANAL FISTULECTOMY  2012   with sphhincterotomy   ANAL FISTULOTOMY N/A 06/29/2015   Procedure: EXCISION PERI RECTAL SCAR/FISTULA, INTERNAL HEMMORRHOIDAL LIGATION, PEXY, MARSUPIALIZATION;  Surgeon: Michael Boston, MD;  Location: [facility];  Service: General;  Laterality: N/A;   EXAMINATION UNDER ANESTHESIA N/A 06/29/2015   Procedure: EXAM UNDER ANESTHESIA;  Surgeon: Michael Boston, MD;  Location: [facility];  Service: General;  Laterality: N/A;   KNEE ARTHROSCOPY W/ MENISCAL REPAIR  08/08/2019   KNEE SURGERY  07/07/2018   RECTAL SURGERY  08/2010   Dr Zenia Resides    Allergies  Allergen Reactions   Mushroom Extract Complex Anaphylaxis and Swelling    Lips   Shrimp [Shellfish Allergy] Anaphylaxis   Tomato Anaphylaxis   Tylenol [Acetaminophen] Swelling    Outpatient Encounter Medications as of 08/25/2022  Medication Sig   atorvastatin (LIPITOR) 20 MG tablet To take 1 tablet by mouth three times weekly in the evening.   dapagliflozin propanediol (FARXIGA) 5 MG TABS tablet Take 1 tablet (5 mg total) by mouth daily before breakfast.   losartan (COZAAR) 100 MG tablet Take 1 tablet (100 mg total) by mouth daily.   nicotine (NICODERM CQ - DOSED IN MG/24 HOURS) 14  mg/24hr patch Place 1 patch (14 mg total) onto the skin daily.   Semaglutide,0.25 or 0.'5MG'$ /DOS, (OZEMPIC, 0.25 OR 0.5 MG/DOSE,) 2 MG/1.5ML SOPN Injection 0.25 mg into skin weekly for 4 weeks then increase to 0.5 mg weekly into skin for diabetes.   sildenafil (VIAGRA) 100 MG tablet Take 1 tablet (100 mg total) by mouth daily as needed for erectile dysfunction.   ALPRAZolam (XANAX) 1 MG tablet Take 1 tablet (1 mg total) by mouth daily as needed for anxiety. (Patient not taking: Reported on 08/25/2022)   [DISCONTINUED] albuterol (PROVENTIL HFA;VENTOLIN HFA) 108 (90 Base) MCG/ACT inhaler Inhale 1-2 puffs into the lungs every 6 (six) hours as needed for wheezing or shortness of breath.   No facility-administered encounter medications on file as of 08/25/2022.    Review of Systems:  Review of Systems  Constitutional:  Negative for activity change, appetite change and fever.  HENT:  Negative for sore throat.   Eyes: Negative.   Cardiovascular:  Negative for chest pain and leg swelling.  Gastrointestinal:  Negative for abdominal distention, diarrhea and vomiting.  Genitourinary:  Negative for dysuria, frequency and urgency.  Skin:  Negative for color change.  Neurological:  Negative for dizziness and headaches.  Psychiatric/Behavioral:  Negative for behavioral problems and sleep disturbance. The patient is not nervous/anxious.     Health Maintenance  Topic Date Due   OPHTHALMOLOGY EXAM  12/31/2018  Diabetic kidney evaluation - Urine ACR  07/30/2019   FOOT EXAM  01/24/2022   HEMOGLOBIN A1C  08/22/2022   Diabetic kidney evaluation - GFR measurement  02/21/2023   TETANUS/TDAP  11/16/2024   Hepatitis C Screening  Completed   HIV Screening  Completed   HPV VACCINES  Aged Out   INFLUENZA VACCINE  Discontinued   COVID-19 Vaccine  Discontinued    Physical Exam: Vitals:   08/25/22 0928  BP: (!) 140/86  Pulse: 86  Resp: 18  Temp: 98 F (36.7 C)  SpO2: 99%  Weight: (!) 325 lb (147.4 kg)   Height: '6\' 6"'$  (1.981 m)   Body mass index is 37.56 kg/m. Physical Exam Constitutional:      Appearance: He is obese.  HENT:     Head: Normocephalic and atraumatic.     Mouth/Throat:     Mouth: Mucous membranes are moist.  Eyes:     Conjunctiva/sclera: Conjunctivae normal.  Cardiovascular:     Rate and Rhythm: Normal rate and regular rhythm.     Pulses: Normal pulses.     Heart sounds: Normal heart sounds.  Pulmonary:     Effort: Pulmonary effort is normal.     Breath sounds: Normal breath sounds.  Abdominal:     General: Bowel sounds are normal.     Palpations: Abdomen is soft.  Musculoskeletal:        General: No swelling. Normal range of motion.     Cervical back: Normal range of motion.  Skin:    General: Skin is warm and dry.  Neurological:     General: No focal deficit present.     Mental Status: He is alert and oriented to person, place, and time.  Psychiatric:        Mood and Affect: Mood normal.        Behavior: Behavior normal.        Thought Content: Thought content normal.        Judgment: Judgment normal.     Labs reviewed: Basic Metabolic Panel: Recent Labs    10/10/21 0841 02/20/22 1419  NA 140 138  K 4.3 4.2  CL 104 107  CO2 27 25  GLUCOSE 158* 153*  BUN 9 13  CREATININE 0.97 1.08  CALCIUM 9.1 9.6   Liver Function Tests: Recent Labs    02/20/22 1419  AST 30  ALT 35  BILITOT 0.5  PROT 6.9   No results for input(s): "LIPASE", "AMYLASE" in the last 8760 hours. No results for input(s): "AMMONIA" in the last 8760 hours. CBC: Recent Labs    10/10/21 0841 02/20/22 1419  WBC 5.8 5.8  NEUTROABS 2,906 3,033  HGB 14.1 14.0  HCT 43.5 42.5  MCV 79.7* 79.0*  PLT 253 225   Lipid Panel: Recent Labs    10/10/21 0841  CHOL 133  HDL 54  LDLCALC 67  TRIG 43  CHOLHDL 2.5   Lab Results  Component Value Date   HGBA1C 8.5 (H) 02/20/2022    Procedures since last visit: No results found.  Assessment/Plan  Labs/tests ordered:  *  No order type specified * Next appt:  Visit date not found

## 2022-08-25 NOTE — Patient Instructions (Signed)

## 2022-08-26 LAB — LIPID PANEL
Cholesterol: 126 mg/dL (ref <200)
HDL: 50 mg/dL (ref >=40)
LDL Cholesterol (Calc): 66 mg/dL (calc)
Non-HDL Cholesterol (Calc): 76 mg/dL (calc) (ref <130)
Total CHOL/HDL Ratio: 2.5 (calc) (ref <5.0)
Triglycerides: 35 mg/dL (ref <150)

## 2022-08-26 LAB — HEMOGLOBIN A1C
Hgb A1c MFr Bld: 6.3 % of total Hgb — ABNORMAL HIGH (ref <5.7)
Mean Plasma Glucose: 134 mg/dL
eAG (mmol/L): 7.4 mmol/L

## 2022-08-26 NOTE — Progress Notes (Signed)
-   A1c 6.3, down from 8.5 (02/20/22), improved! Continue current medications. -   Lipid panel at goal, great job! Continue Atorvastatin.

## 2022-10-24 ENCOUNTER — Other Ambulatory Visit: Payer: Self-pay | Admitting: Nurse Practitioner

## 2022-10-24 DIAGNOSIS — E1169 Type 2 diabetes mellitus with other specified complication: Secondary | ICD-10-CM

## 2022-10-24 DIAGNOSIS — N529 Male erectile dysfunction, unspecified: Secondary | ICD-10-CM

## 2022-10-24 MED ORDER — OZEMPIC (0.25 OR 0.5 MG/DOSE) 2 MG/1.5ML ~~LOC~~ SOPN: 0.5000 mg | PEN_INJECTOR | SUBCUTANEOUS | 1 refills | Status: DC

## 2022-10-24 NOTE — Telephone Encounter (Signed)
[facility] requested refills. Pended Rx's and sent to [facility] for approval.

## 2022-12-04 ENCOUNTER — Other Ambulatory Visit: Payer: Self-pay | Admitting: Nurse Practitioner

## 2022-12-04 DIAGNOSIS — E669 Obesity, unspecified: Secondary | ICD-10-CM

## 2022-12-13 ENCOUNTER — Other Ambulatory Visit: Payer: Self-pay

## 2022-12-13 DIAGNOSIS — I10 Essential (primary) hypertension: Secondary | ICD-10-CM

## 2022-12-13 DIAGNOSIS — F419 Anxiety disorder, unspecified: Secondary | ICD-10-CM

## 2022-12-13 DIAGNOSIS — E785 Hyperlipidemia, unspecified: Secondary | ICD-10-CM

## 2022-12-13 DIAGNOSIS — E1169 Type 2 diabetes mellitus with other specified complication: Secondary | ICD-10-CM

## 2022-12-13 MED ORDER — LOSARTAN POTASSIUM 100 MG PO TABS: 100.0000 mg | ORAL_TABLET | Freq: Every day | ORAL | 0 refills | Status: DC

## 2022-12-13 MED ORDER — ATORVASTATIN CALCIUM 20 MG PO TABS: ORAL_TABLET | ORAL | 0 refills | Status: DC

## 2022-12-13 MED ORDER — DAPAGLIFLOZIN PROPANEDIOL 5 MG PO TABS: 5.0000 mg | ORAL_TABLET | Freq: Every day | ORAL | 0 refills | Status: DC

## 2022-12-13 NOTE — Telephone Encounter (Signed)
Incoming fax received from [facility] for a refill request on patient. [place].   I will send to [name], NP to review   Side Note: No future appointment pending. Patient seen last  [place] in October 2023 and no indication was as to when patient needs to follow-up.

## 2022-12-13 NOTE — Telephone Encounter (Signed)
Mychart message was sent to patient prompting him to call to schedule an appointment. Patient called as soon as he received message and scheduled for Monday 12/25/22.  Patient states he needs refills on all of his medications to [facility].    Patient is requesting a refill of the following medications: Requested Prescriptions   Pending Prescriptions Disp Refills   ALPRAZolam (XANAX) 1 MG tablet 30 tablet 0    Sig: Take 1 tablet (1 mg total) by mouth daily as needed for anxiety.   Signed Prescriptions Disp Refills   atorvastatin (LIPITOR) 20 MG tablet 90 tablet 0    Sig: To take 1 tablet by mouth three times weekly in the evening.    Authorizing Provider: [name]    Ordering User: Leigh Aurora C   dapagliflozin propanediol (FARXIGA) 5 MG TABS tablet 90 tablet 0    Sig: Take 1 tablet (5 mg total) by mouth daily before breakfast.    Authorizing Provider: [name]    Ordering User: Leigh Aurora C   losartan (COZAAR) 100 MG tablet 90 tablet 0    Sig: Take 1 tablet (100 mg total) by mouth daily.    Authorizing Provider: [name]    Ordering User: Logan Bores    Date of last refill: Xanax, 08/25/2022  Refill amount: #30  Treatment agreement date: 04/25/2022

## 2022-12-13 NOTE — Telephone Encounter (Signed)
Please call patient and see if we are still his PCP, if so he will need to set up a follow up appt in the next month or 2 if last appt was in Oct to recheck labs.

## 2022-12-13 NOTE — Telephone Encounter (Signed)
Will refill at follow up

## 2022-12-25 ENCOUNTER — Encounter: Payer: Self-pay | Admitting: Nurse Practitioner

## 2022-12-25 ENCOUNTER — Ambulatory Visit (INDEPENDENT_AMBULATORY_CARE_PROVIDER_SITE_OTHER): Payer: 59 | Admitting: Nurse Practitioner

## 2022-12-25 VITALS — BP 128/86 | HR 81 | Temp 98.0°F | Ht 78.0 in | Wt 326.6 lb

## 2022-12-25 DIAGNOSIS — E785 Hyperlipidemia, unspecified: Secondary | ICD-10-CM

## 2022-12-25 DIAGNOSIS — E669 Obesity, unspecified: Secondary | ICD-10-CM

## 2022-12-25 DIAGNOSIS — F17200 Nicotine dependence, unspecified, uncomplicated: Secondary | ICD-10-CM

## 2022-12-25 DIAGNOSIS — E1169 Type 2 diabetes mellitus with other specified complication: Secondary | ICD-10-CM | POA: Diagnosis not present

## 2022-12-25 DIAGNOSIS — I10 Essential (primary) hypertension: Secondary | ICD-10-CM

## 2022-12-25 DIAGNOSIS — F419 Anxiety disorder, unspecified: Secondary | ICD-10-CM

## 2022-12-25 MED ORDER — ALPRAZOLAM 1 MG PO TABS: 1.0000 mg | ORAL_TABLET | Freq: Every day | ORAL | 0 refills | Status: DC | PRN

## 2022-12-25 NOTE — Progress Notes (Signed)
Careteam: Patient Care Team: [name], NP as PCP - General (Geriatric Medicine) Melissa Montane, MD as Consulting Physician (Otolaryngology)  PLACE OF SERVICE:  [place]  Advanced Directive information    Allergies  Allergen Reactions   Mushroom Extract Complex Anaphylaxis and Swelling    Lips   Shrimp [Shellfish Allergy] Anaphylaxis   Tomato Anaphylaxis   Tylenol [Acetaminophen] Swelling    Chief Complaint  Patient presents with   Medical Management of Chronic Issues     HPI: Patient is a 37 y.o. male for routine follow up.  Continues to be back and forth from [place].  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   Htn- controlled      Review of Systems:  Review of Systems  Constitutional:  Negative for chills, fever and weight loss.  HENT:  Negative for tinnitus.   Respiratory:  Negative for cough, sputum production and shortness of breath.   Cardiovascular:  Negative for chest pain, palpitations and leg swelling.  Gastrointestinal:  Negative for abdominal pain, constipation, diarrhea and heartburn.  Genitourinary:  Negative for dysuria, frequency and urgency.  Musculoskeletal:  Negative for back pain, falls, joint pain and myalgias.  Skin: Negative.   Neurological:  Negative for dizziness and headaches.  Psychiatric/Behavioral:  Negative for depression and memory loss. The patient is nervous/anxious. The patient does not have insomnia.     Past Medical History:  Diagnosis Date   Blood in stool    Diabetes mellitus without complication (HCC)    GERD (gastroesophageal reflux disease)    History of anal fissures    Low sperm motility    Past Surgical History:  Procedure Laterality Date   ANAL FISTULECTOMY  2012   with sphhincterotomy   ANAL FISTULOTOMY N/A 06/29/2015   Procedure: EXCISION  PERI RECTAL SCAR/FISTULA, INTERNAL HEMMORRHOIDAL LIGATION, PEXY, MARSUPIALIZATION;  Surgeon: Michael Boston, MD;  Location: [facility];  Service: General;  Laterality: N/A;   EXAMINATION UNDER ANESTHESIA N/A 06/29/2015   Procedure: EXAM UNDER ANESTHESIA;  Surgeon: Michael Boston, MD;  Location: [facility];  Service: General;  Laterality: N/A;   KNEE ARTHROSCOPY W/ MENISCAL REPAIR  08/08/2019   KNEE SURGERY  07/07/2018   RECTAL SURGERY  08/2010   Dr Zenia Resides   Social History:   reports that he has been smoking cigarettes. He has never used smokeless tobacco. He reports current alcohol use. He reports that he does not use drugs.  Family History  Problem Relation Age of Onset   Diabetes Mother    Hypertension Mother    Throat cancer Maternal Aunt    Cirrhosis Maternal Grandfather    Colon cancer Neg Hx    Colon polyps Neg Hx    Kidney disease Neg Hx     Medications: Patient's Medications  New Prescriptions   No medications on file  Previous Medications   ALPRAZOLAM (XANAX) 1 MG TABLET    Take 1 tablet (1 mg total) by mouth daily as needed for anxiety.   ATORVASTATIN (LIPITOR) 20 MG TABLET    To take 1 tablet by mouth three times weekly in the evening.   DAPAGLIFLOZIN PROPANEDIOL (FARXIGA) 5 MG TABS TABLET    Take 1 tablet (5 mg total) by mouth daily before breakfast.   LOSARTAN (COZAAR) 100 MG TABLET    Take 1 tablet (  100 mg total) by mouth daily.   OZEMPIC, 0.25 OR 0.5 MG/DOSE, 2 MG/3ML SOPN    INJECT 0.5 MG INTO THE SKIN  WEEKLY   SILDENAFIL (VIAGRA) 100 MG TABLET    TAKE 1 TABLET BY MOUTH DAILY AS  NEEDED FOR ERECTILE DYSFUNCTION  Modified Medications   No medications on file  Discontinued Medications   No medications on file    Physical Exam:  Vitals:   12/25/22 1424  BP: 128/86  Pulse: 81  Temp: 98 F (36.7 C)  SpO2: 97%  Weight: (!) 326 lb 9.6 oz (148.1 kg)  Height: 6' 6"$  (1.981 m)   Body mass index is 37.74 kg/m. Wt Readings from Last 3 Encounters:  12/25/22 (!) 326 lb 9.6  oz (148.1 kg)  08/25/22 (!) 325 lb (147.4 kg)  02/20/22 (!) 344 lb (156 kg)    Physical Exam Constitutional:      General: He is not in acute distress.    Appearance: He is well-developed. He is not diaphoretic.  HENT:     Head: Normocephalic and atraumatic.     Right Ear: External ear normal.     Left Ear: External ear normal.     Mouth/Throat:     Pharynx: No oropharyngeal exudate.  Eyes:     Conjunctiva/sclera: Conjunctivae normal.     Pupils: Pupils are equal, round, and reactive to light.  Cardiovascular:     Rate and Rhythm: Normal rate and regular rhythm.     Heart sounds: Normal heart sounds.  Pulmonary:     Effort: Pulmonary effort is normal.     Breath sounds: Normal breath sounds.  Abdominal:     General: Bowel sounds are normal.     Palpations: Abdomen is soft.  Musculoskeletal:        General: No tenderness.     Cervical back: Normal range of motion and neck supple.     Right lower leg: No edema.     Left lower leg: No edema.  Skin:    General: Skin is warm and dry.  Neurological:     Mental Status: He is alert and oriented to person, place, and time.     Labs reviewed: Basic Metabolic Panel: Recent Labs    02/20/22 1419  NA 138  K 4.2  CL 107  CO2 25  GLUCOSE 153*  BUN 13  CREATININE 1.08  CALCIUM 9.6   Liver Function Tests: Recent Labs    02/20/22 1419  AST 30  ALT 35  BILITOT 0.5  PROT 6.9   No results for input(s): "LIPASE", "AMYLASE" in the last 8760 hours. No results for input(s): "AMMONIA" in the last 8760 hours. CBC: Recent Labs    02/20/22 1419  WBC 5.8  NEUTROABS 3,033  HGB 14.0  HCT 42.5  MCV 79.0*  PLT 225   Lipid Panel: Recent Labs    08/25/22 1015  CHOL 126  HDL 50  LDLCALC 66  TRIG 35  CHOLHDL 2.5   TSH: No results for input(s): "TSH" in the last 8760 hours. A1C: Lab Results  Component Value Date   HGBA1C 6.3 (H) 08/25/2022     Assessment/Plan 1. Anxiety -discussed lifestyle modifications -we  also discussed use of long term medication vs xanax- xanas is not to be use every day to manage anxiety. Also educated on dependency. He states he does not need daily and not wanting to start another medication to take daily at this time - ALPRAZolam (XANAX) 1 MG tablet; Take  1 tablet (1 mg total) by mouth daily as needed for anxiety.  Dispense: 30 tablet; Refill: 0  2. Diabetes mellitus type 2 in obese ([place]) -Encouraged dietary compliance, routine foot care/monitoring and to keep up with diabetic eye exams through ophthalmology  Continue current medication as prescribed - Hemoglobin A1c - Microalbumin/Creatinine Ratio, Urine  3. Hyperlipidemia, unspecified hyperlipidemia type -continue statin with dietary modifications.  - Complete Metabolic Panel with eGFR  4. Essential hypertension -Blood pressure well controlled, goal bp <140/90 Continue current medications and dietary modifications follow metabolic panel - Complete Metabolic Panel with eGFR - CBC with Differential/Platelet  5. Morbid obesity ([place]) --education provided on healthy weight loss through increase in physical activity and proper nutrition   6. Smoker -encouraged cessation    Return in about 6 months (around 06/25/2023) for routine follow up, labs at time of appt.Carlos American. [place], [facility] [phone]

## 2022-12-26 LAB — CBC WITH DIFFERENTIAL/PLATELET
Absolute Monocytes: 494 cells/uL (ref 200–950)
Basophils Absolute: 33 cells/uL (ref 0–200)
Basophils Relative: 0.5 %
Eosinophils Absolute: 33 cells/uL (ref 15–500)
Eosinophils Relative: 0.5 %
HCT: 40.8 % (ref 38.5–50.0)
Hemoglobin: 13.7 g/dL (ref 13.2–17.1)
Lymphs Abs: 2399 cells/uL (ref 850–3900)
MCH: 26.3 pg — ABNORMAL LOW (ref 27.0–33.0)
MCHC: 33.6 g/dL (ref 32.0–36.0)
MCV: 78.5 fL — ABNORMAL LOW (ref 80.0–100.0)
MPV: 11.7 fL (ref 7.5–12.5)
Monocytes Relative: 7.6 %
Neutro Abs: 3543 cells/uL (ref 1500–7800)
Neutrophils Relative %: 54.5 %
Platelets: 216 10*3/uL (ref 140–400)
RBC: 5.2 10*6/uL (ref 4.20–5.80)
RDW: 13.3 % (ref 11.0–15.0)
Total Lymphocyte: 36.9 %
WBC: 6.5 10*3/uL (ref 3.8–10.8)

## 2022-12-26 LAB — HEMOGLOBIN A1C
Hgb A1c MFr Bld: 6.1 % of total Hgb — ABNORMAL HIGH (ref <5.7)
Mean Plasma Glucose: 128 mg/dL
eAG (mmol/L): 7.1 mmol/L

## 2022-12-26 LAB — COMPLETE METABOLIC PANEL WITH GFR
AG Ratio: 1.6 (calc) (ref 1.0–2.5)
ALT: 26 U/L (ref 9–46)
AST: 27 U/L (ref 10–40)
Albumin: 3.9 g/dL (ref 3.6–5.1)
Alkaline phosphatase (APISO): 80 U/L (ref 36–130)
BUN: 10 mg/dL (ref 7–25)
CO2: 23 mmol/L (ref 20–32)
Calcium: 9.1 mg/dL (ref 8.6–10.3)
Chloride: 112 mmol/L — ABNORMAL HIGH (ref 98–110)
Creat: 1.1 mg/dL (ref 0.60–1.26)
Globulin: 2.4 g/dL (calc) (ref 1.9–3.7)
Glucose, Bld: 101 mg/dL — ABNORMAL HIGH (ref 65–99)
Potassium: 4.1 mmol/L (ref 3.5–5.3)
Sodium: 143 mmol/L (ref 135–146)
Total Bilirubin: 0.4 mg/dL (ref 0.2–1.2)
Total Protein: 6.3 g/dL (ref 6.1–8.1)
eGFR: 89 mL/min/{1.73_m2} (ref >=60)

## 2022-12-26 LAB — MICROALBUMIN / CREATININE URINE RATIO
Creatinine, Urine: 339 mg/dL — ABNORMAL HIGH (ref 20–320)
Microalb Creat Ratio: 4 mcg/mg creat (ref <30)
Microalb, Ur: 1.4 mg/dL

## 2023-01-28 ENCOUNTER — Other Ambulatory Visit: Payer: Self-pay | Admitting: Nurse Practitioner

## 2023-01-28 DIAGNOSIS — E1169 Type 2 diabetes mellitus with other specified complication: Secondary | ICD-10-CM

## 2023-01-28 DIAGNOSIS — I10 Essential (primary) hypertension: Secondary | ICD-10-CM

## 2023-02-07 ENCOUNTER — Telehealth: Payer: Self-pay

## 2023-02-07 NOTE — Telephone Encounter (Signed)
Outgoing call placed to [facility] to assure that medication was processed and shipped as we received approval letter for Ozempic today. Per the pharmacist rx was processed and has shipped.

## 2023-02-16 ENCOUNTER — Encounter: Payer: Medicare Other | Admitting: Nurse Practitioner

## 2023-02-16 ENCOUNTER — Encounter: Payer: Self-pay | Admitting: Nurse Practitioner

## 2023-02-16 NOTE — Progress Notes (Deleted)
Subjective:   Jonathan Taylor is a 37 y.o. male who presents for Medicare Annual/Subsequent preventive examination.  Review of Systems    ***       Objective:    There were no vitals filed for this visit. There is no height or weight on file to calculate BMI.     02/16/2023    8:58 AM 02/20/2022    2:06 PM 11/28/2021    8:03 AM 11/13/2021    2:06 AM 05/13/2021   11:33 AM 01/24/2021    1:14 PM 07/06/2020    5:25 PM  Advanced Directives  Does Patient Have a Medical Advance Directive? No No No No No No No  Would patient like information on creating a medical advance directive? No - Patient declined No - Patient declined No - Patient declined  No - Patient declined No - Patient declined No - Patient declined    Current Medications (verified) Outpatient Encounter Medications as of 02/16/2023  Medication Sig  . ALPRAZolam (XANAX) 1 MG tablet Take 1 tablet (1 mg total) by mouth daily as needed for anxiety.  Marland Kitchen atorvastatin (LIPITOR) 20 MG tablet To take 1 tablet by mouth three times weekly in the evening.  . dapagliflozin propanediol (FARXIGA) 5 MG TABS tablet TAKE 1 TABLET BY MOUTH DAILY  BEFORE BREAKFAST  . losartan (COZAAR) 100 MG tablet TAKE 1 TABLET BY MOUTH DAILY  . OZEMPIC, 0.25 OR 0.5 MG/DOSE, 2 MG/3ML SOPN INJECT 0.5 MG INTO THE SKIN  WEEKLY  . sildenafil (VIAGRA) 100 MG tablet TAKE 1 TABLET BY MOUTH DAILY AS  NEEDED FOR ERECTILE DYSFUNCTION  . [DISCONTINUED] albuterol (PROVENTIL HFA;VENTOLIN HFA) 108 (90 Base) MCG/ACT inhaler Inhale 1-2 puffs into the lungs every 6 (six) hours as needed for wheezing or shortness of breath.   No facility-administered encounter medications on file as of 02/16/2023.    Allergies (verified) Mushroom extract complex, Shrimp [shellfish allergy], Tomato, and Tylenol [acetaminophen]   History: Past Medical History:  Diagnosis Date  . Blood in stool   . Diabetes mellitus without complication   . GERD (gastroesophageal reflux disease)   . History of  anal fissures   . Low sperm motility    Past Surgical History:  Procedure Laterality Date  . ANAL FISTULECTOMY  2012   with sphhincterotomy  . ANAL FISTULOTOMY N/A 06/29/2015   Procedure: EXCISION PERI RECTAL SCAR/FISTULA, INTERNAL HEMMORRHOIDAL LIGATION, PEXY, MARSUPIALIZATION;  Surgeon: Karie Soda, MD;  Location: [facility];  Service: General;  Laterality: N/A;  . EXAMINATION UNDER ANESTHESIA N/A 06/29/2015   Procedure: EXAM UNDER ANESTHESIA;  Surgeon: Karie Soda, MD;  Location: [facility];  Service: General;  Laterality: N/A;  . KNEE ARTHROSCOPY W/ MENISCAL REPAIR  08/08/2019  . KNEE SURGERY  07/07/2018  . RECTAL SURGERY  08/2010   Dr Freida Busman   Family History  Problem Relation Age of Onset  . Diabetes Mother   . Hypertension Mother   . Throat cancer Maternal Aunt   . Cirrhosis Maternal Grandfather   . Colon cancer Neg Hx   . Colon polyps Neg Hx   . Kidney disease Neg Hx    Social History   Socioeconomic History  . Marital status: Married    Spouse name: Not on file  . Number of children: 0  . Years of education: Not on file  . Highest education level: Not on file  Occupational History  . Occupation: Location manager; Forensic scientist  Tobacco Use  . Smoking status: Some Days    Years:  10    Types: Cigarettes    Last attempt to quit: 03/06/2021    Years since quitting: 1.9  . Smokeless tobacco: Never  . Tobacco comments:    Depends on stress level  Vaping Use  . Vaping Use: Never used  Substance and Sexual Activity  . Alcohol use: Yes    Comment: Occassionally  . Drug use: No  . Sexual activity: Yes    Birth control/protection: None    Comment: Married  Other Topics Concern  . Not on file  Social History Narrative   Diet? Low carb meat/veggies water      Do you drink/eat things with caffeine? sometimes      Marital status?        married                            What year were you married? 2015      Do you live in a house, apartment, assisted living, condo,  trailer, etc.? townhouse      Is it one or more stories? 2 story      How many persons live in your home? 3      Do you have any pets in your home? (please list) yes 1 dog      Current or past profession: forklift/machine operator      Do you exercise?       Only at work                               Type & how often? daily      Do you have a living will? n/a      Do you have a DNR form? n/a                                 If not, do you want to discuss one?      Do you have signed POA/HPOA for forms?  n/a   Social Determinants of Health   Financial Resource Strain: Not on file  Food Insecurity: Not on file  Transportation Needs: Not on file  Physical Activity: Not on file  Stress: Not on file  Social Connections: Not on file    Tobacco Counseling Ready to quit: Not Answered Counseling given: Not Answered Tobacco comments: Depends on stress level   Clinical Intake:                 Diabetic?***         Activities of Daily Living     No data to display           Patient Care Team: Sharon Seller, NP as PCP - General (Geriatric Medicine) Suzanna Obey, MD as Consulting Physician (Otolaryngology)  Indicate any recent Medical Services you may have received from other than [facility] providers in the past year (date may be approximate).     Assessment:   This is a routine wellness examination for [place].  Hearing/Vision screen Hearing Screening - Comments:: No hearing issues Vision Screening - Comments:: Last eye exam greater than 12 months ago. Patient is undecided as to whether or not he will follow-up with [facility] in [place] or establish with eye doctor in [place].   Dietary issues and exercise activities discussed:     Goals Addressed  None   Depression Screen    02/16/2023    1:25 PM 10/10/2021    8:01 AM 09/29/2019    1:09 PM 09/04/2018    1:22 PM 02/01/2018   10:46 AM 11/29/2016    8:34 AM  PHQ 2/9 Scores  PHQ - 2 Score 0 0 0 0  0 0    Fall Risk    02/16/2023    1:25 PM 10/10/2021    8:01 AM 05/24/2020    3:50 PM 09/29/2019    1:08 PM 05/16/2019   11:33 AM  Fall Risk   Falls in the past year? 0 0 0 0 0  Number falls in past yr: 0 0  0 0  Injury with Fall? 0 0  0 0  Risk for fall due to : No Fall Risks No Fall Risks     Follow up Falls evaluation completed Falls evaluation completed       FALL RISK PREVENTION PERTAINING TO THE HOME:  Any stairs in or around the home? {YES/NO:21197} If so, are there any without handrails? {YES/NO:21197} Home free of loose throw rugs in walkways, pet beds, electrical cords, etc? {YES/NO:21197} Adequate lighting in your home to reduce risk of falls? {YES/NO:21197}  ASSISTIVE DEVICES UTILIZED TO PREVENT FALLS:  Life alert? {YES/NO:21197} Use of a cane, walker or w/c? {YES/NO:21197} Grab bars in the bathroom? {YES/NO:21197} Shower chair or bench in shower? {YES/NO:21197} Elevated toilet seat or a handicapped toilet? {YES/NO:21197}  TIMED UP AND GO:  Was the test performed? {YES/NO:21197}.  Length of time to ambulate 10 feet: *** sec.   {Appearance of QMVH:8469629}  Cognitive Function:        Immunizations Immunization History  Administered Date(s) Administered  . Influenza,inj,Quad PF,6+ Mos 11/16/2014  . Pneumococcal Polysaccharide-23 09/12/2016  . Tdap 11/16/2014    {TDAP status:2101805}  {Flu Vaccine status:2101806}  {Pneumococcal vaccine status:2101807}  {Covid-19 vaccine status:2101808}  Qualifies for Shingles Vaccine? {YES/NO:21197}  Zostavax completed {YES/NO:21197}  {Shingrix Completed?:2101804}  Screening Tests Health Maintenance  Topic Date Due  . OPHTHALMOLOGY EXAM  12/31/2018  . HEMOGLOBIN A1C  06/25/2023  . Diabetic kidney evaluation - eGFR measurement  12/26/2023  . Diabetic kidney evaluation - Urine ACR  12/26/2023  . FOOT EXAM  12/26/2023  . DTaP/Tdap/Td (2 - Td or Tdap) 11/16/2024  . Hepatitis C Screening  Completed  . HIV  Screening  Completed  . HPV VACCINES  Aged Out  . INFLUENZA VACCINE  Discontinued  . COVID-19 Vaccine  Discontinued    Health Maintenance  Health Maintenance Due  Topic Date Due  . OPHTHALMOLOGY EXAM  12/31/2018    {Colorectal cancer screening:2101809}  Lung Cancer Screening: (Low Dose CT Chest recommended if Age 34-80 years, 30 pack-year currently smoking OR have quit w/in 15years.) {DOES NOT does:27190::"does not"} qualify.   Lung Cancer Screening Referral: ***  Additional Screening:  Hepatitis C Screening: {DOES NOT does:27190::"does not"} qualify; Completed ***  Vision Screening: Recommended annual ophthalmology exams for early detection of glaucoma and other disorders of the eye. Is the patient up to date with their annual eye exam?  {YES/NO:21197} Who is the provider or what is the name of the office in which the patient attends annual eye exams? *** If pt is not established with a provider, would they like to be referred to a provider to establish care? {YES/NO:21197}.   Dental Screening: Recommended annual dental exams for proper oral hygiene  Community Resource Referral / Chronic Care Management: CRR required this visit?  {YES/NO:21197}  CCM required this visit?  {YES/NO:21197}     Plan:     I have personally reviewed and noted the following in the patient's chart:   Medical and social history Use of alcohol, tobacco or illicit drugs  Current medications and supplements including opioid prescriptions. {Opioid Prescriptions:8153214910} Functional ability and status Nutritional status Physical activity Advanced directives List of other physicians Hospitalizations, surgeries, and ER visits in previous 12 months Vitals Screenings to include cognitive, depression, and falls Referrals and appointments  In addition, I have reviewed and discussed with patient certain preventive protocols, quality metrics, and best practice recommendations. A written  personalized care plan for preventive services as well as general preventive health recommendations were provided to patient.     Sharon Seller, NP   02/16/2023   Nurse Notes: ***

## 2023-02-16 NOTE — Progress Notes (Signed)
   This service is provided via telemedicine  No vital signs collected/recorded due to the encounter was a telemedicine visit.   Location of patient (ex: home, work):  Home  Patient consents to a telephone visit: Yes, see telephone visit dated 04/25/2022  Location of the provider (ex: office, home):  [facility] Office   Name of any referring provider:  N/A  Names of all persons participating in the telemedicine service and their role in the encounter:  S.Chrae B/CMA, Abbey Chatters, NP, and Patient   Time spent on call:  5 min with medical assistant

## 2023-02-20 ENCOUNTER — Other Ambulatory Visit: Payer: Self-pay

## 2023-02-20 DIAGNOSIS — E785 Hyperlipidemia, unspecified: Secondary | ICD-10-CM

## 2023-02-20 DIAGNOSIS — N529 Male erectile dysfunction, unspecified: Secondary | ICD-10-CM

## 2023-02-20 MED ORDER — SILDENAFIL CITRATE 100 MG PO TABS: ORAL_TABLET | ORAL | 1 refills | Status: DC

## 2023-02-20 MED ORDER — ATORVASTATIN CALCIUM 20 MG PO TABS: ORAL_TABLET | ORAL | 0 refills | Status: DC

## 2023-03-06 ENCOUNTER — Telehealth: Payer: Self-pay

## 2023-03-06 NOTE — Telephone Encounter (Signed)
Patient called and requested Ozempic samples. Secure chat sent to PCP Sharon Seller, NP. She states that it was fine for patient to pick up samples. Ok to give 2 pens, which  will require 2 boxes.

## 2023-05-09 ENCOUNTER — Other Ambulatory Visit: Payer: Self-pay

## 2023-05-09 DIAGNOSIS — E669 Obesity, unspecified: Secondary | ICD-10-CM

## 2023-05-09 MED ORDER — OZEMPIC (0.25 OR 0.5 MG/DOSE) 2 MG/3ML ~~LOC~~ SOPN: PEN_INJECTOR | SUBCUTANEOUS | 0 refills | Status: DC

## 2023-05-09 MED ORDER — OZEMPIC (0.25 OR 0.5 MG/DOSE) 2 MG/3ML ~~LOC~~ SOPN
PEN_INJECTOR | SUBCUTANEOUS | 1 refills | Status: DC
Start: 2023-05-09 — End: 2023-07-13

## 2023-05-30 ENCOUNTER — Telehealth: Payer: Medicare Other

## 2023-05-30 NOTE — Telephone Encounter (Signed)
Patient called to inquiry if we have any samples of ozempic or sildenafil. Patient informed that we do not have samples at this time and he did not need anything additional.

## 2023-07-10 ENCOUNTER — Encounter: Payer: Medicare Other | Admitting: Sports Medicine

## 2023-07-11 ENCOUNTER — Telehealth: Payer: Self-pay

## 2023-07-11 ENCOUNTER — Telehealth: Payer: Medicare Other

## 2023-07-11 ENCOUNTER — Encounter: Payer: Self-pay | Admitting: Family

## 2023-07-11 ENCOUNTER — Ambulatory Visit (INDEPENDENT_AMBULATORY_CARE_PROVIDER_SITE_OTHER): Payer: 59 | Admitting: Family

## 2023-07-11 VITALS — BP 130/80 | HR 79 | Temp 98.3°F | Resp 16 | Ht 78.0 in | Wt 320.0 lb

## 2023-07-11 DIAGNOSIS — G4733 Obstructive sleep apnea (adult) (pediatric): Secondary | ICD-10-CM | POA: Diagnosis not present

## 2023-07-11 DIAGNOSIS — E669 Obesity, unspecified: Secondary | ICD-10-CM

## 2023-07-11 DIAGNOSIS — D509 Iron deficiency anemia, unspecified: Secondary | ICD-10-CM

## 2023-07-11 DIAGNOSIS — E1169 Type 2 diabetes mellitus with other specified complication: Secondary | ICD-10-CM

## 2023-07-11 DIAGNOSIS — F411 Generalized anxiety disorder: Secondary | ICD-10-CM

## 2023-07-11 DIAGNOSIS — I10 Essential (primary) hypertension: Secondary | ICD-10-CM

## 2023-07-11 DIAGNOSIS — E782 Mixed hyperlipidemia: Secondary | ICD-10-CM | POA: Diagnosis not present

## 2023-07-11 DIAGNOSIS — Z5181 Encounter for therapeutic drug level monitoring: Secondary | ICD-10-CM

## 2023-07-11 NOTE — Telephone Encounter (Signed)
Patient seen Jonathan Blade, NP today, however asked that I contact his [facility], Gundlach, NP to discuss getting an alternative to Xanax.   Outgoing call place to [place], per Shanda Bumps alternative can take 1 week to see effectiveness and full effectiveness can take up to 4 weeks. Patient aware and verbalized understanding. Patient also aware message will be addressed tomorrow  Pharmacy confirmed: [facility] [address]

## 2023-07-11 NOTE — Telephone Encounter (Signed)
Medication Samples have been provided to the patient.  Drug name: Ozempic       Strength: 0.25/0.5mg         Qty: 1  LOT: [id number]  Exp.Date: 01/03/2025  Dosing instructions: iNJECT WEEKLY  The patient has been instructed regarding the correct time, dose, and frequency of taking this medication, including desired effects and most common side effects.   Odette Fraction 3:17 PM 07/11/2023

## 2023-07-11 NOTE — Progress Notes (Signed)
This encounter was created in error - please disregard.
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
## 2023-07-12 ENCOUNTER — Telehealth: Payer: Self-pay

## 2023-07-12 ENCOUNTER — Encounter: Payer: Self-pay | Admitting: Nurse Practitioner

## 2023-07-12 DIAGNOSIS — E1169 Type 2 diabetes mellitus with other specified complication: Secondary | ICD-10-CM

## 2023-07-12 LAB — LIPID PANEL
Cholesterol: 153 mg/dL (ref <200)
HDL: 62 mg/dL (ref >=40)
LDL Cholesterol (Calc): 80 mg/dL
Non-HDL Cholesterol (Calc): 91 mg/dL (ref <130)
Total CHOL/HDL Ratio: 2.5 (calc) (ref <5.0)
Triglycerides: 39 mg/dL (ref <150)

## 2023-07-12 LAB — CBC WITH DIFFERENTIAL/PLATELET
Absolute Monocytes: 494 {cells}/uL (ref 200–950)
Basophils Absolute: 20 {cells}/uL (ref 0–200)
Basophils Relative: 0.3 %
Eosinophils Absolute: 52 {cells}/uL (ref 15–500)
Eosinophils Relative: 0.8 %
HCT: 44.2 % (ref 38.5–50.0)
Hemoglobin: 14.5 g/dL (ref 13.2–17.1)
Lymphs Abs: 2841 {cells}/uL (ref 850–3900)
MCH: 26.4 pg — ABNORMAL LOW (ref 27.0–33.0)
MCHC: 32.8 g/dL (ref 32.0–36.0)
MCV: 80.5 fL (ref 80.0–100.0)
MPV: 11.5 fL (ref 7.5–12.5)
Monocytes Relative: 7.6 %
Neutro Abs: 3094 {cells}/uL (ref 1500–7800)
Neutrophils Relative %: 47.6 %
Platelets: 224 10*3/uL (ref 140–400)
RBC: 5.49 10*6/uL (ref 4.20–5.80)
RDW: 13 % (ref 11.0–15.0)
Total Lymphocyte: 43.7 %
WBC: 6.5 10*3/uL (ref 3.8–10.8)

## 2023-07-12 LAB — COMPLETE METABOLIC PANEL WITH GFR
AG Ratio: 1.7 (calc) (ref 1.0–2.5)
ALT: 34 U/L (ref 9–46)
AST: 31 U/L (ref 10–40)
Albumin: 4.3 g/dL (ref 3.6–5.1)
Alkaline phosphatase (APISO): 75 U/L (ref 36–130)
BUN: 18 mg/dL (ref 7–25)
CO2: 26 mmol/L (ref 20–32)
Calcium: 9.6 mg/dL (ref 8.6–10.3)
Chloride: 104 mmol/L (ref 98–110)
Creat: 1.06 mg/dL (ref 0.60–1.26)
Globulin: 2.5 g/dL (ref 1.9–3.7)
Glucose, Bld: 95 mg/dL (ref 65–99)
Potassium: 4.2 mmol/L (ref 3.5–5.3)
Sodium: 139 mmol/L (ref 135–146)
Total Bilirubin: 0.4 mg/dL (ref 0.2–1.2)
Total Protein: 6.8 g/dL (ref 6.1–8.1)
eGFR: 93 mL/min/{1.73_m2} (ref >=60)

## 2023-07-12 LAB — HEMOGLOBIN A1C
Hgb A1c MFr Bld: 6.1 %{Hb} — ABNORMAL HIGH (ref <5.7)
Mean Plasma Glucose: 128 mg/dL
eAG (mmol/L): 7.1 mmol/L

## 2023-07-12 LAB — TSH: TSH: 1.4 m[IU]/L (ref 0.40–4.50)

## 2023-07-12 MED ORDER — ESCITALOPRAM OXALATE 5 MG PO TABS
5.0000 mg | ORAL_TABLET | Freq: Every day | ORAL | 1 refills | Status: DC
Start: 2023-07-12 — End: 2023-07-26

## 2023-07-12 NOTE — Telephone Encounter (Signed)
See phone note created today and sent to Dinah for additional question patient had following yesterday visit with Richarda Blade, NP

## 2023-07-12 NOTE — Telephone Encounter (Signed)
Rx for lexapro sent to pharmacy, low dose given to avoid side effects- to take daily Lets have him follow up in 2 weeks for response.

## 2023-07-12 NOTE — Telephone Encounter (Signed)
Providers reply sent Via My Chart message.

## 2023-07-12 NOTE — Telephone Encounter (Signed)
Patient states Jonathan Taylor was going to up his ozempic and he would like to know if she sent in a new rx. Patient was informed that Jonathan Taylor was waiting for labs to result and will make a decision based on lab values.

## 2023-07-12 NOTE — Telephone Encounter (Signed)
Will call in medication once the lab results return.

## 2023-07-12 NOTE — Telephone Encounter (Signed)
Awaiting lab work and response from dinah on labs since she saw pt on 9/4

## 2023-07-12 NOTE — Telephone Encounter (Signed)
Labs are back.

## 2023-07-13 MED ORDER — SEMAGLUTIDE (1 MG/DOSE) 4 MG/3ML ~~LOC~~ SOPN
1.0000 mg | PEN_INJECTOR | SUBCUTANEOUS | 0 refills | Status: DC
Start: 2023-07-13 — End: 2023-07-16

## 2023-07-13 NOTE — Telephone Encounter (Signed)
Ozempic increased from 0.5 mg to 1 mg injection weekly

## 2023-07-15 ENCOUNTER — Other Ambulatory Visit: Payer: Self-pay | Admitting: Nurse Practitioner

## 2023-07-15 DIAGNOSIS — E1169 Type 2 diabetes mellitus with other specified complication: Secondary | ICD-10-CM

## 2023-07-15 DIAGNOSIS — I10 Essential (primary) hypertension: Secondary | ICD-10-CM

## 2023-07-16 ENCOUNTER — Other Ambulatory Visit: Payer: Self-pay

## 2023-07-16 DIAGNOSIS — E1169 Type 2 diabetes mellitus with other specified complication: Secondary | ICD-10-CM

## 2023-07-16 MED ORDER — SEMAGLUTIDE (1 MG/DOSE) 4 MG/3ML ~~LOC~~ SOPN: 1.0000 mg | PEN_INJECTOR | SUBCUTANEOUS | 1 refills | Status: DC

## 2023-07-26 ENCOUNTER — Other Ambulatory Visit: Payer: Self-pay | Admitting: Nurse Practitioner

## 2023-07-26 DIAGNOSIS — F411 Generalized anxiety disorder: Secondary | ICD-10-CM

## 2023-07-27 ENCOUNTER — Other Ambulatory Visit: Payer: Self-pay | Admitting: Nurse Practitioner

## 2023-07-27 DIAGNOSIS — E785 Hyperlipidemia, unspecified: Secondary | ICD-10-CM

## 2024-01-08 ENCOUNTER — Telehealth: Payer: Self-pay

## 2024-01-08 NOTE — Telephone Encounter (Signed)
 Incoming fax received from patients pharmacy to initiate a prior authorization for                   .  PA initiated through covermymeds. Key: [id number]  Awaiting reply from the insurance company which will be determined in 48-72 hours.

## 2024-01-08 NOTE — Telephone Encounter (Signed)
 Approved:    [facility]

## 2024-01-11 ENCOUNTER — Ambulatory Visit (INDEPENDENT_AMBULATORY_CARE_PROVIDER_SITE_OTHER): Payer: Medicare Other | Admitting: Nurse Practitioner

## 2024-01-11 VITALS — BP 144/100 | HR 87 | Temp 97.7°F | Resp 17 | Ht 78.0 in | Wt 312.6 lb

## 2024-01-11 DIAGNOSIS — E1169 Type 2 diabetes mellitus with other specified complication: Secondary | ICD-10-CM | POA: Diagnosis not present

## 2024-01-11 DIAGNOSIS — F411 Generalized anxiety disorder: Secondary | ICD-10-CM

## 2024-01-11 DIAGNOSIS — N529 Male erectile dysfunction, unspecified: Secondary | ICD-10-CM

## 2024-01-11 DIAGNOSIS — M25561 Pain in right knee: Secondary | ICD-10-CM

## 2024-01-11 DIAGNOSIS — G4733 Obstructive sleep apnea (adult) (pediatric): Secondary | ICD-10-CM

## 2024-01-11 DIAGNOSIS — I10 Essential (primary) hypertension: Secondary | ICD-10-CM | POA: Diagnosis not present

## 2024-01-11 DIAGNOSIS — E669 Obesity, unspecified: Secondary | ICD-10-CM

## 2024-01-11 DIAGNOSIS — G8929 Other chronic pain: Secondary | ICD-10-CM

## 2024-01-11 DIAGNOSIS — E782 Mixed hyperlipidemia: Secondary | ICD-10-CM

## 2024-01-11 DIAGNOSIS — F17200 Nicotine dependence, unspecified, uncomplicated: Secondary | ICD-10-CM

## 2024-01-11 MED ORDER — SILDENAFIL CITRATE 100 MG PO TABS: ORAL_TABLET | ORAL | 1 refills | Status: AC
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
## 2024-01-12 LAB — HM DIABETES EYE EXAM

## 2024-01-14 ENCOUNTER — Other Ambulatory Visit

## 2024-01-14 ENCOUNTER — Other Ambulatory Visit: Payer: Self-pay | Admitting: Nurse Practitioner

## 2024-01-14 ENCOUNTER — Encounter: Payer: Self-pay | Admitting: Nurse Practitioner

## 2024-01-14 DIAGNOSIS — E1169 Type 2 diabetes mellitus with other specified complication: Secondary | ICD-10-CM

## 2024-01-14 DIAGNOSIS — Z5181 Encounter for therapeutic drug level monitoring: Secondary | ICD-10-CM

## 2024-01-14 DIAGNOSIS — F419 Anxiety disorder, unspecified: Secondary | ICD-10-CM

## 2024-01-14 DIAGNOSIS — I10 Essential (primary) hypertension: Secondary | ICD-10-CM

## 2024-01-14 DIAGNOSIS — E782 Mixed hyperlipidemia: Secondary | ICD-10-CM

## 2024-01-14 NOTE — Addendum Note (Signed)
 Addended by: Nelda Severe A on: 01/14/2024 11:12 AM   Modules accepted: Orders

## 2024-01-14 NOTE — Progress Notes (Signed)
 Wrong test code placed per lab tech, Beth.  Test code needed ordering is [id number].

## 2024-01-15 LAB — LIPID PANEL
Cholesterol: 148 mg/dL (ref <200)
HDL: 63 mg/dL (ref >=40)
LDL Cholesterol (Calc): 74 mg/dL
Non-HDL Cholesterol (Calc): 85 mg/dL (ref <130)
Total CHOL/HDL Ratio: 2.3 (calc) (ref <5.0)
Triglycerides: 40 mg/dL (ref <150)

## 2024-01-15 LAB — CBC WITH DIFFERENTIAL/PLATELET
Absolute Lymphocytes: 2190 {cells}/uL (ref 850–3900)
Absolute Monocytes: 504 {cells}/uL (ref 200–950)
Basophils Absolute: 28 {cells}/uL (ref 0–200)
Basophils Relative: 0.5 %
Eosinophils Absolute: 62 {cells}/uL (ref 15–500)
Eosinophils Relative: 1.1 %
HCT: 47.3 % (ref 38.5–50.0)
Hemoglobin: 15.1 g/dL (ref 13.2–17.1)
MCH: 26.2 pg — ABNORMAL LOW (ref 27.0–33.0)
MCHC: 31.9 g/dL — ABNORMAL LOW (ref 32.0–36.0)
MCV: 82 fL (ref 80.0–100.0)
MPV: 11.4 fL (ref 7.5–12.5)
Monocytes Relative: 9 %
Neutro Abs: 2817 {cells}/uL (ref 1500–7800)
Neutrophils Relative %: 50.3 %
Platelets: 261 10*3/uL (ref 140–400)
RBC: 5.77 10*6/uL (ref 4.20–5.80)
RDW: 12.9 % (ref 11.0–15.0)
Total Lymphocyte: 39.1 %
WBC: 5.6 10*3/uL (ref 3.8–10.8)

## 2024-01-15 LAB — DRUG MONITORING, PANEL 6 WITH CONFIRMATION, URINE
6 Acetylmorphine: NEGATIVE ng/mL (ref <10)
Alcohol Metabolites: NEGATIVE ng/mL (ref <500)
Amphetamines: NEGATIVE ng/mL (ref <500)
Barbiturates: NEGATIVE ng/mL (ref <300)
Benzodiazepines: NEGATIVE ng/mL (ref <100)
Cocaine Metabolite: NEGATIVE ng/mL (ref <150)
Creatinine: >300 mg/dL (ref >=20.0)
Marijuana Metabolite: NEGATIVE ng/mL (ref <20)
Methadone Metabolite: NEGATIVE ng/mL (ref <100)
Opiates: NEGATIVE ng/mL (ref <100)
Oxidant: NEGATIVE ug/mL (ref <200)
Oxycodone: NEGATIVE ng/mL (ref <100)
Phencyclidine: NEGATIVE ng/mL (ref <25)
pH: 5.3 (ref 4.5–9.0)

## 2024-01-15 LAB — MICROALBUMIN / CREATININE URINE RATIO
Creatinine, Urine: 505 mg/dL — ABNORMAL HIGH (ref 20–320)
Microalb Creat Ratio: 4 mg/g{creat} (ref <30)
Microalb, Ur: 1.9 mg/dL

## 2024-01-15 LAB — COMPLETE METABOLIC PANEL WITH GFR
AG Ratio: 2 (calc) (ref 1.0–2.5)
ALT: 44 U/L (ref 9–46)
AST: 43 U/L — ABNORMAL HIGH (ref 10–40)
Albumin: 4.6 g/dL (ref 3.6–5.1)
Alkaline phosphatase (APISO): 70 U/L (ref 36–130)
BUN: 11 mg/dL (ref 7–25)
CO2: 29 mmol/L (ref 20–32)
Calcium: 9.6 mg/dL (ref 8.6–10.3)
Chloride: 104 mmol/L (ref 98–110)
Creat: 1.13 mg/dL (ref 0.60–1.26)
Globulin: 2.3 g/dL (ref 1.9–3.7)
Glucose, Bld: 99 mg/dL (ref 65–99)
Potassium: 4 mmol/L (ref 3.5–5.3)
Sodium: 139 mmol/L (ref 135–146)
Total Bilirubin: 0.6 mg/dL (ref 0.2–1.2)
Total Protein: 6.9 g/dL (ref 6.1–8.1)
eGFR: 86 mL/min/{1.73_m2} (ref >=60)

## 2024-01-15 LAB — HEMOGLOBIN A1C
Hgb A1c MFr Bld: 5.9 %{Hb} — ABNORMAL HIGH (ref <5.7)
Mean Plasma Glucose: 123 mg/dL
eAG (mmol/L): 6.8 mmol/L

## 2024-01-15 LAB — DM TEMPLATE

## 2024-01-16 ENCOUNTER — Encounter: Payer: Self-pay | Admitting: Nurse Practitioner

## 2024-01-16 ENCOUNTER — Other Ambulatory Visit: Payer: Self-pay | Admitting: Nurse Practitioner

## 2024-01-16 DIAGNOSIS — E1169 Type 2 diabetes mellitus with other specified complication: Secondary | ICD-10-CM

## 2024-01-16 DIAGNOSIS — F419 Anxiety disorder, unspecified: Secondary | ICD-10-CM

## 2024-01-16 DIAGNOSIS — E785 Hyperlipidemia, unspecified: Secondary | ICD-10-CM

## 2024-01-16 DIAGNOSIS — I10 Essential (primary) hypertension: Secondary | ICD-10-CM

## 2024-01-16 MED ORDER — ALPRAZOLAM 1 MG PO TABS: 0.5000 mg | ORAL_TABLET | Freq: Every day | ORAL | 0 refills | Status: DC | PRN

## 2024-01-16 MED ORDER — ATORVASTATIN CALCIUM 20 MG PO TABS: ORAL_TABLET | ORAL | 1 refills | Status: DC

## 2024-01-16 MED ORDER — LOSARTAN POTASSIUM 100 MG PO TABS: 100.0000 mg | ORAL_TABLET | Freq: Every day | ORAL | 1 refills | Status: DC

## 2024-01-16 MED ORDER — SEMAGLUTIDE (1 MG/DOSE) 4 MG/3ML ~~LOC~~ SOPN
1.0000 mg | PEN_INJECTOR | SUBCUTANEOUS | 1 refills | Status: DC
Start: 2024-01-16 — End: 2024-07-08

## 2024-01-16 NOTE — Telephone Encounter (Signed)
 Call patient and discussed with him that he is to take both medications.

## 2024-01-23 ENCOUNTER — Other Ambulatory Visit: Payer: Self-pay | Admitting: Nurse Practitioner

## 2024-01-23 DIAGNOSIS — F419 Anxiety disorder, unspecified: Secondary | ICD-10-CM

## 2024-01-23 NOTE — Telephone Encounter (Signed)
 Copied from CRM [id number]. Topic: Clinical - Medication Refill >> Jan 23, 2024 11:56 AM Yvone Neu wrote: Most Recent Primary Care Visit:  Provider: [facility]  Department: [facility]  Visit Type: LAB VISIT  Date: 01/14/2024  Medication: ALPRAZolam Prudy Feeler)  Has the patient contacted their pharmacy? Yes (Agent: If no, request that the patient contact the pharmacy for the refill. If patient does not wish to contact the pharmacy document the reason why and proceed with request.) (Agent: If yes, when and what did the pharmacy advise?)  Is this the correct pharmacy for this prescription? Yes If no, delete pharmacy and type the correct one.  This is the patient's preferred [facility] [address] [place], [place] [address] [phone]  Has the prescription been filled recently? No  Is the patient out of the medication? Yes  Has the patient been seen for an appointment in the last year OR does the patient have an upcoming appointment? Yes  Can we respond through MyChart? Yes  Agent: Please be advised that Rx refills may take up to 3 business days. We ask that you follow-up with your pharmacy.

## 2024-01-25 ENCOUNTER — Other Ambulatory Visit: Payer: Self-pay | Admitting: Nurse Practitioner

## 2024-01-25 DIAGNOSIS — F419 Anxiety disorder, unspecified: Secondary | ICD-10-CM

## 2024-01-25 NOTE — Telephone Encounter (Signed)
 Copied from CRM [id number]. Topic: Clinical - Medication Question >> Jan 25, 2024  3:05 PM Maree Krabbe H wrote: Reason for CRM: Patient called today and wanted his medicine filled at another pharmacy so he could pick it up where he was, patient stated that the pharmacy would have to cancel and the rx sent because this is not a transferable medicine/rx. The medicine is ALPRAZolam Prudy Feeler) and [facility] [address] [place], [place] [address] [phone]  Patients callback number is [phone] regardless of the status the patient would like a callback. Patient is back in [place] and that's why he needs the rx sent to that pharmacy, he never picked the one up here.

## 2024-01-29 ENCOUNTER — Telehealth: Payer: Self-pay

## 2024-01-29 NOTE — Telephone Encounter (Signed)
 Copied from CRM [id number]. Topic: Clinical - Medication Question >> Jan 25, 2024  3:05 PM Maree Krabbe H wrote: Reason for CRM: Patient called today and wanted his medicine filled at another pharmacy so he could pick it up where he was, patient stated that the pharmacy would have to cancel and the rx sent because this is not a transferable medicine/rx. The medicine is ALPRAZolam Prudy Feeler) and the [facility] [address] [place],  [address] [phone]  Patients callback number is [phone] regardless of the status the patient would like a callback. Patient is back in [place] and that's why he needs the rx sent to that pharmacy, he never picked the one up here. >> Jan 29, 2024 12:50 PM Corin V wrote: Patient called back and asked for update. Advised it is being processed. He asked for Jessica's nurse to call him once this is done.  Spoke with to let him know that the medication has been reorder and sent to the pharmacy that patient requested. FYI

## 2024-02-04 ENCOUNTER — Telehealth: Payer: Self-pay

## 2024-02-04 NOTE — Telephone Encounter (Signed)
Message routed to PCP Eubanks, Jessica K, NP  

## 2024-02-04 NOTE — Telephone Encounter (Signed)
 Copied from CRM [id number]. Topic: Clinical - Prescription Issue >> Feb 04, 2024  3:49 PM Suzette B wrote: Reason for CRM: Patient called in regards to an issue with his Alprazolam, he states he's been waiting almost two weeks for his medication has not heard anything as of yet. Patient has requested a call back  @ [phone]

## 2024-02-04 NOTE — Telephone Encounter (Signed)
 Please re-read message below. Patient states medication he is requesting and location he is requesting medication to be sent to

## 2024-02-04 NOTE — Telephone Encounter (Signed)
 Copied from CRM [id number]. Topic: Clinical - Medication Question >> Jan 25, 2024  3:05 PM Maree Krabbe H wrote: Reason for CRM: Patient called today and wanted his medicine filled at another pharmacy so he could pick it up where he was, patient stated that the pharmacy would have to cancel and the rx sent because this is not a transferable medicine/rx. The medicine is ALPRAZolam Prudy Feeler) and the [facility] [address] [place] [address] [place], [place] [address] [phone]  Patients callback number is [phone] regardless of the status the patient would like a callback. Patient is back in [place] and that's why he needs the rx sent to that pharmacy, he never picked the one up here. >> Feb 04, 2024  3:42 PM Alcus Dad H wrote: Patient called because he said medication (Xanax) still has not been sent in. Says this has been going on for 2 weeks. Wanted to speak with provider. Contacted [facility] and spoke with Mrs. Alondra who said I could transfer patient.  >> Jan 29, 2024 12:50 PM Corin V wrote: Patient called back and asked for update. Advised it is being processed. He asked for Jessica's nurse to call him once this is done

## 2024-02-04 NOTE — Telephone Encounter (Signed)
 medication was sent to his local pharmacy on 01/16/24 as he requested when he was in town.    ALPRAZolam (XANAX) 1 MG tablet [[id number]]   Order Details Dose: 0.5-1 mg Route: Oral Frequency: Daily PRN for anxiety  Dispense Quantity: 30 tablet Refills: 0        Sig: Take 0.5-1 tablets (0.5-1 mg total) by mouth daily as needed for anxiety.       Start Date: 01/16/24 End Date: --  Written Date: 01/16/24 Expiration Date: 07/14/24     Associated Diagnoses: Anxiety [F41.9]  Original Order: ALPRAZolam Prudy Feeler) 1 MG tablet [[id number]]     Order Questions  Question Answer Comment  Supervising Provider Earnestine Mealing       Providers  Authorizing Provider: Sharon Seller, NP [address] [place] [address] Phone: [phone]   Fax: [phone] DEA #: [id number]   NPI: [id number] Supervising Provider: Earnestine Mealing, MD [address] [place], [place] [address] Phone: [phone]   Fax: [phone] DEA #: [id number]   NPI: [id number]     Ordering User: Sharon Seller, NP        [phone] [place], [place] - [phone] ST AT [facility] [address] [place], [place] [address] Phone: [phone]  Fax: [phone]

## 2024-02-04 NOTE — Telephone Encounter (Signed)
 Ive only received messages from today. Previous messages were taken by [facility].B/CMA. Message routed to PCP Janyth Contes, Janene Harvey, NP

## 2024-02-05 ENCOUNTER — Telehealth: Payer: Self-pay

## 2024-02-05 NOTE — Telephone Encounter (Signed)
 Patient called stating " You all are making me feel like a crack head, I need my [facility] [place], Shanda Bumps has transferred rx to them before."  I confirmed with patient that Rx sent to local [facility] was not picked up and patient assured me it wasn't. [facility] [address], spoke with Freida Busman and he confirmed that rx sent on 01/16/24 for Xanax was not picked up.   Patient stated that if we will take the time to check his records we will see that Xanax rx was transferred to [facility] in [place] before. I tried to call [facility] in [place] and they are closed for lunch. This info may be available on to controlled substance website (provider would need to change location to see refills outside of [place])

## 2024-02-05 NOTE — Telephone Encounter (Signed)
 Thank you for reply. Message routed to Clinical Intake [place].B/CMA.

## 2024-02-05 NOTE — Telephone Encounter (Signed)
 Medication was prescribed in [place], prescription was sent over to the pharmacy in a timely manner and prior to him leaving to go out of state.  Due to this being a controlled substance will not send prescription to [place].

## 2024-02-06 NOTE — Telephone Encounter (Signed)
 We are not sending xanax orders out of state.

## 2024-02-06 NOTE — Telephone Encounter (Signed)
 Spoke with patient and he is planning to get a family member to pick up rx from the [facility] here and to mail to him. It was recommended that patient get a medical provider in [place] if majority of his time will be spent there.

## 2024-04-14 ENCOUNTER — Encounter: Payer: Self-pay | Admitting: Adult Health

## 2024-04-14 ENCOUNTER — Ambulatory Visit (INDEPENDENT_AMBULATORY_CARE_PROVIDER_SITE_OTHER): Admitting: Adult Health

## 2024-04-14 VITALS — BP 138/79 | HR 84 | Temp 98.1°F | Resp 18 | Ht 78.0 in | Wt 311.4 lb

## 2024-04-14 DIAGNOSIS — E1169 Type 2 diabetes mellitus with other specified complication: Secondary | ICD-10-CM | POA: Diagnosis not present

## 2024-04-14 DIAGNOSIS — E669 Obesity, unspecified: Secondary | ICD-10-CM

## 2024-04-14 DIAGNOSIS — Z6835 Body mass index (BMI) 35.0-35.9, adult: Secondary | ICD-10-CM

## 2024-04-14 DIAGNOSIS — F411 Generalized anxiety disorder: Secondary | ICD-10-CM

## 2024-04-14 DIAGNOSIS — L918 Other hypertrophic disorders of the skin: Secondary | ICD-10-CM

## 2024-04-14 DIAGNOSIS — E66812 Obesity, class 2: Secondary | ICD-10-CM

## 2024-04-14 DIAGNOSIS — I10 Essential (primary) hypertension: Secondary | ICD-10-CM | POA: Diagnosis not present

## 2024-04-14 NOTE — Patient Instructions (Addendum)
 [facility]  [phone]  Skin Tag, Adult A skin tag (acrochordon) is a soft, extra growth of skin. Most skin tags are skin-colored and rarely bigger than a pencil eraser. They often form in areas where there is frequent rubbing, or friction, on the skin. This may be where there are folds in the skin, such as: The eyelids. The neck. The armpits. The groin. Skin tags are not dangerous, and they do not spread from person to person (are not contagious). You may have one skin tag or many. Skin tags do not need treatment. However, your health care provider may recommend removing a skin tag if it: Gets irritated from clothing or jewelry. Bleeds. Is visible and unsightly. What are the causes? This condition is linked to: Increasing age. Pregnancy. Diabetes. Obesity. What are the signs or symptoms? Skin tags usually do not cause symptoms unless they get irritated by items touching your skin, such as clothing or jewelry. When this happens, you may have pain, itching, or bleeding. How is this diagnosed? This condition is diagnosed with an evaluation from your health care provider. No testing is needed for diagnosis. How is this treated? Treatment for this condition depends on whether you have symptoms. Your health care provider may also remove your skin tag if it is visible or if you do not like the way it looks. A skin tag can be removed by a health care provider with: A simple surgical procedure using scissors. A procedure that involves freezing your skin tag with a gas in liquid form (liquid nitrogen). A procedure that uses heat to destroy your skin tag (electrodessication). Follow these instructions at home: Watch for any changes in your skin tag. A normal skin tag does not require any other special care at home. Take over-the-counter and prescription medicines only as told by your health care provider. Keep all follow-up visits. Contact a health care provider if: You have  a skin tag that: Becomes painful. Changes color. Bleeds. Swells. Summary Skin tags are soft, extra growths of skin found in areas of frequent rubbing or friction. Skin tags usually do not cause symptoms. If symptoms occur, you may have pain, itching, or bleeding. Your health care provider may remove your skin tag if it causes symptoms or if you do not like the way it looks. This information is not intended to replace advice given to you by your health care provider. Make sure you discuss any questions you have with your health care provider. Document Revised: 12/07/2021 Document Reviewed: 12/07/2021 Elsevier Patient Education  2024 [employer].

## 2024-04-14 NOTE — Progress Notes (Unsigned)
 [facility] clinic  Provider:  Inge Mangle DNP  Code Status:  Full Code  Goals of Care:     01/11/2024    3:38 PM  Advanced Directives  Does Patient Have a Medical Advance Directive? No  Would patient like information on creating a medical advance directive? No - Patient declined     Chief Complaint  Patient presents with   Leg Pain    Right Leg Skin Tag, hanging and irritated    HPI: Patient is a 38 y.o. male seen today for an acute visit for Discussed the use of AI scribe software for clinical note transcription with the patient, who gave verbal consent to proceed.  History of Present Illness   Wt Readings from Last 3 Encounters:  04/14/24 (!) 311 lb 6.4 oz (141.3 kg)  01/11/24 (!) 312 lb 9.6 oz (141.8 kg)  07/11/23 (!) 320 lb (145.2 kg)       Past Medical History:  Diagnosis Date   Blood in stool    Diabetes mellitus without complication (HCC)    GERD (gastroesophageal reflux disease)    History of anal fissures    Low sperm motility     Past Surgical History:  Procedure Laterality Date   ANAL FISTULECTOMY  2012   with sphhincterotomy   ANAL FISTULOTOMY N/A 06/29/2015   Procedure: EXCISION PERI RECTAL SCAR/FISTULA, INTERNAL HEMMORRHOIDAL LIGATION, PEXY, MARSUPIALIZATION;  Surgeon: Candyce Champagne, MD;  Location: [facility];  Service: General;  Laterality: N/A;   EXAMINATION UNDER ANESTHESIA N/A 06/29/2015   Procedure: EXAM UNDER ANESTHESIA;  Surgeon: Candyce Champagne, MD;  Location: [facility];  Service: General;  Laterality: N/A;   KNEE ARTHROSCOPY W/ MENISCAL REPAIR  08/08/2019   KNEE SURGERY  07/07/2018   RECTAL SURGERY  08/2010   Dr Leighton Punches    Allergies  Allergen Reactions   Mushroom Extract Complex (Obsolete) Anaphylaxis and Swelling    Lips   Shrimp [Shellfish Allergy] Anaphylaxis   Tomato Anaphylaxis   Tylenol  [Acetaminophen ] Swelling    Outpatient Encounter Medications as of 04/14/2024  Medication Sig   ALPRAZolam  (XANAX ) 1 MG tablet Take 0.5-1 tablets  (0.5-1 mg total) by mouth daily as needed for anxiety.   atorvastatin  (LIPITOR) 20 MG tablet TAKE 1 TABLET BY MOUTH THREE  TIMES WEEKLY IN THE EVENING.   losartan  (COZAAR ) 100 MG tablet Take 1 tablet (100 mg total) by mouth daily.   Semaglutide , 1 MG/DOSE, 4 MG/3ML SOPN Inject 1 mg as directed once a week.   sildenafil  (VIAGRA ) 100 MG tablet TAKE 1 TABLET BY MOUTH DAILY AS  NEEDED FOR ERECTILE DYSFUNCTION   UNABLE TO FIND Med Name: Shilajit resin 200 mg resin twice daily by mouth for cholesterol   [DISCONTINUED] albuterol  (PROVENTIL  HFA;VENTOLIN  HFA) 108 (90 Base) MCG/ACT inhaler Inhale 1-2 puffs into the lungs every 6 (six) hours as needed for wheezing or shortness of breath.   No facility-administered encounter medications on file as of 04/14/2024.    Review of Systems:  Review of Systems  Constitutional:  Negative for activity change, appetite change and fever.  HENT:  Negative for sore throat.   Eyes: Negative.   Cardiovascular:  Negative for chest pain and leg swelling.  Gastrointestinal:  Negative for abdominal distention, diarrhea and vomiting.  Genitourinary:  Negative for dysuria, frequency and urgency.  Skin:  Negative for color change.  Neurological:  Negative for dizziness and headaches.  Psychiatric/Behavioral:  Negative for behavioral problems and sleep disturbance. The patient is not nervous/anxious.  Health Maintenance  Topic Date Due   Pneumococcal Vaccine 71-33 Years old (2 of 2 - PCV) 11/06/2024 (Originally 09/12/2017)   HEMOGLOBIN A1C  07/16/2024   DTaP/Tdap/Td (2 - Td or Tdap) 11/16/2024   FOOT EXAM  01/10/2025   OPHTHALMOLOGY EXAM  01/11/2025   Diabetic kidney evaluation - eGFR measurement  01/13/2025   Diabetic kidney evaluation - Urine ACR  01/13/2025   Hepatitis C Screening  Completed   HIV Screening  Completed   HPV VACCINES  Aged Out   Meningococcal B Vaccine  Aged Out   INFLUENZA VACCINE  Discontinued   COVID-19 Vaccine  Discontinued    Physical  Exam: Vitals:   04/14/24 0846  BP: 138/79  Pulse: 84  Resp: 18  Temp: 98.1 F (36.7 C)  SpO2: 98%  Weight: (!) 311 lb 6.4 oz (141.3 kg)  Height: 6\' 6"  (1.981 m)   Body mass index is 35.99 kg/m. Physical Exam Constitutional:      Appearance: Normal appearance.  HENT:     Head: Normocephalic and atraumatic.     Mouth/Throat:     Mouth: Mucous membranes are moist.  Eyes:     Conjunctiva/sclera: Conjunctivae normal.  Cardiovascular:     Rate and Rhythm: Normal rate and regular rhythm.     Pulses: Normal pulses.     Heart sounds: Normal heart sounds.  Pulmonary:     Effort: Pulmonary effort is normal.     Breath sounds: Normal breath sounds.  Abdominal:     General: Bowel sounds are normal.     Palpations: Abdomen is soft.  Musculoskeletal:        General: No swelling. Normal range of motion.     Cervical back: Normal range of motion.  Skin:    General: Skin is warm and dry.     Comments: Skin tag on right inner thigh  Neurological:     General: No focal deficit present.     Mental Status: He is alert and oriented to person, place, and time.  Psychiatric:        Mood and Affect: Mood normal.        Behavior: Behavior normal.        Thought Content: Thought content normal.        Judgment: Judgment normal.     Labs reviewed: Basic Metabolic Panel: Recent Labs    07/11/23 1618 01/14/24 1023  NA 139 139  K 4.2 4.0  CL 104 104  CO2 26 29  GLUCOSE 95 99  BUN 18 11  CREATININE 1.06 1.13  CALCIUM  9.6 9.6  TSH 1.40  --    Liver Function Tests: Recent Labs    07/11/23 1618 01/14/24 1023  AST 31 43*  ALT 34 44  BILITOT 0.4 0.6  PROT 6.8 6.9   No results for input(s): "LIPASE", "AMYLASE" in the last 8760 hours. No results for input(s): "AMMONIA" in the last 8760 hours. CBC: Recent Labs    07/11/23 1618 01/14/24 1023  WBC 6.5 5.6  NEUTROABS 3,094 2,817  HGB 14.5 15.1  HCT 44.2 47.3  MCV 80.5 82.0  PLT 224 261   Lipid Panel: Recent Labs     07/11/23 1618 01/14/24 1023  CHOL 153 148  HDL 62 63  LDLCALC 80 74  TRIG 39 40  CHOLHDL 2.5 2.3   Lab Results  Component Value Date   HGBA1C 5.9 (H) 01/14/2024    Procedures since last visit: No results found.  Assessment/Plan Assessment and Plan  Assessment & Plan     ***   Labs/tests ordered:  * No order type specified *   No follow-ups on file.  Hadasa Gasner Medina-Vargas, NP

## 2024-06-09 ENCOUNTER — Encounter: Payer: Self-pay | Admitting: Nurse Practitioner

## 2024-06-09 ENCOUNTER — Ambulatory Visit (INDEPENDENT_AMBULATORY_CARE_PROVIDER_SITE_OTHER): Admitting: Nurse Practitioner

## 2024-06-09 VITALS — BP 142/112 | HR 81 | Temp 98.0°F | Ht 78.0 in | Wt 310.2 lb

## 2024-06-09 DIAGNOSIS — I10 Essential (primary) hypertension: Secondary | ICD-10-CM

## 2024-06-09 DIAGNOSIS — E1169 Type 2 diabetes mellitus with other specified complication: Secondary | ICD-10-CM

## 2024-06-09 DIAGNOSIS — Z5181 Encounter for therapeutic drug level monitoring: Secondary | ICD-10-CM

## 2024-06-09 DIAGNOSIS — E785 Hyperlipidemia, unspecified: Secondary | ICD-10-CM | POA: Diagnosis not present

## 2024-06-09 DIAGNOSIS — F419 Anxiety disorder, unspecified: Secondary | ICD-10-CM

## 2024-06-09 DIAGNOSIS — L84 Corns and callosities: Secondary | ICD-10-CM

## 2024-06-09 DIAGNOSIS — E669 Obesity, unspecified: Secondary | ICD-10-CM

## 2024-06-09 MED ORDER — ALPRAZOLAM 1 MG PO TABS: 0.5000 mg | ORAL_TABLET | Freq: Every day | ORAL | 2 refills | Status: AC | PRN

## 2024-06-09 NOTE — Progress Notes (Signed)
 Provider: Caro Harlene POUR, NP  Patient Care Team: Caro Harlene POUR, NP as PCP - General (Geriatric Medicine) Roark Rush, MD as Consulting Physician (Otolaryngology)  Extended Emergency Contact Information Primary Emergency Contact: Geneva Address: [address]          [place], [place] [address] [place] Home Phone: [phone] Work Phone: [phone] Relation: Spouse Secondary Emergency Contact: Fugett,Catherine Address: [address]          [place], [place] [address] [place] Home Phone: [phone] Relation: Grandmother Mother: Artelia Marval [place] Home Phone: [phone] Allergies  Allergen Reactions   Mushroom Extract Complex (Obsolete) Anaphylaxis and Swelling    Lips   Shrimp [Shellfish Allergy] Anaphylaxis   Tomato Anaphylaxis   Tylenol  [Acetaminophen ] Swelling   Code Status: FULL  Goals of Care: Advanced Directive information    01/11/2024    3:38 PM  Advanced Directives  Does Patient Have a Medical Advance Directive? No  Would patient like information on creating a medical advance directive? No - Patient declined     Chief Complaint  Patient presents with   Annual Exam    HPI: Patient is a 38 y.o. male seen in today for an wellness exam at [facility].  Discussed the use of AI scribe software for clinical note transcription with the patient, who gave verbal consent to proceed.  History of Present Illness Jonathan Taylor is a 38 year old male who presents for an annual physical exam and medication refill.  Previous urine tests showed microscopic levels of albumin, currently on losartan . He is due for a recheck of his urine microalbumin levels today.  He has a history of smoking and is currently trying to quit by using a vape. He has not tried any medications to assist with smoking cessation. He is concerned about potential side effects of smoking cessation medications.   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   He experiences slight chest pain occasionally, which he attributes to overexertion and smoking. He also notes swelling in his left leg, which he associates with prolonged periods of sitting while driving. He has a history of meniscus surgery on his other leg.  He mentions having a corn on his left foot that persists despite wearing comfortable shoes while driving. He requests a referral to a podiatrist for further evaluation.  He has a history of weight management issues and is currently on Ozempic . He is concerned about his weight fluctuating between 310 to 315 pounds despite not eating much. He is due for an A1c test today to assess his diabetes management.      07/11/2023    3:09 PM 02/16/2023    1:25 PM 10/10/2021    8:01 AM 09/29/2019    1:09 PM 09/04/2018    1:22 PM  Depression screen PHQ 2/9  Decreased Interest 0 0 0 0 0  Down, Depressed, Hopeless 0 0 0 0 0  PHQ - 2 Score 0 0 0 0 0       10/10/2021    8:01 AM 11/13/2021    2:06 AM 02/16/2023    1:25 PM 07/11/2023    3:09 PM 01/11/2024    3:38 PM  Fall Risk  Falls in the past year? 0  0 0 0  Was there an injury with Fall? 0  0  0  Fall Risk Category Calculator 0  0  0  Fall Risk Category (Retired) Low       (RETIRED) Patient Fall Risk Level Low fall risk  Low fall risk      Patient at Risk for Falls Due to No Fall Risks  No Fall Risks  No Fall Risks  Fall risk Follow up Falls evaluation completed   Falls evaluation completed  Falls evaluation completed     Data saved with a previous flowsheet row definition       No data to display           Health Maintenance  Topic Date Due   Hepatitis B Vaccines (1 of 3 - 19+ 3-dose series) Never done   HPV VACCINES (1 - Risk 3-dose SCDM series) Never done   Pneumococcal Vaccine: 19-49 Years (2 of 2 - PCV) 11/06/2024 (Originally 09/12/2017)    HEMOGLOBIN A1C  07/16/2024   DTaP/Tdap/Td (2 - Td or Tdap) 11/16/2024   FOOT EXAM  01/10/2025   OPHTHALMOLOGY EXAM  01/11/2025   Diabetic kidney evaluation - eGFR measurement  01/13/2025   Diabetic kidney evaluation - Urine ACR  01/13/2025   Hepatitis C Screening  Completed   HIV Screening  Completed   Meningococcal B Vaccine  Aged Out   INFLUENZA VACCINE  Discontinued   COVID-19 Vaccine  Discontinued    Past Medical History:  Diagnosis Date   Blood in stool    Diabetes mellitus without complication (HCC)    GERD (gastroesophageal reflux disease)    History of anal fissures    Low sperm motility     Past Surgical History:  Procedure Laterality Date   ANAL FISTULECTOMY  2012   with sphhincterotomy   ANAL FISTULOTOMY N/A 06/29/2015   Procedure: EXCISION PERI RECTAL SCAR/FISTULA, INTERNAL HEMMORRHOIDAL LIGATION, PEXY, MARSUPIALIZATION;  Surgeon: Elspeth Schultze, MD;  Location: [facility];  Service: General;  Laterality: N/A;   EXAMINATION UNDER ANESTHESIA N/A 06/29/2015   Procedure: EXAM UNDER ANESTHESIA;  Surgeon: Elspeth Schultze, MD;  Location: [facility];  Service: General;  Laterality: N/A;   KNEE ARTHROSCOPY W/ MENISCAL REPAIR  08/08/2019   KNEE SURGERY  07/07/2018   RECTAL SURGERY  08/2010   Dr Dasie    Social History   Socioeconomic History   Marital status: Married    Spouse name: Not on file   Number of children: 0   Years of education: Not on file   Highest education level: Not on file  Occupational History   Occupation: Location manager; QC specialist  Tobacco Use   Smoking status: Some Days    Current packs/day: 0.00    Types: Cigarettes    Start date: 03/07/2011    Last attempt to quit: 03/06/2021    Years since quitting: 3.2   Smokeless tobacco: Never   Tobacco comments:    Depends on stress level  Vaping Use   Vaping status: Never Used  Substance and Sexual Activity   Alcohol use: Yes    Comment: Occassionally   Drug use: No   Sexual activity: Yes    Birth  control/protection: None    Comment: Married  Other Topics Concern   Not on file  Social History Narrative   Diet? Low carb meat/veggies water      Do you drink/eat things with caffeine? sometimes      Marital status?        married  What year were you married? 2015      Do you live in a house, apartment, assisted living, condo, trailer, etc.? townhouse      Is it one or more stories? 2 story      How many persons live in your home? 3      Do you have any pets in your home? (please list) yes 1 dog      Current or past profession: forklift/machine operator      Do you exercise?       Only at work                               Type & how often? daily      Do you have a living will? n/a      Do you have a DNR form? n/a                                 If not, do you want to discuss one?      Do you have signed POA/HPOA for forms?  n/a   Social Drivers of Health   Financial Resource Strain: Low Risk  (04/13/2024)   Overall Financial Resource Strain (CARDIA)    Difficulty of Paying Living Expenses: Not hard at all  Food Insecurity: No Food Insecurity (04/13/2024)   Hunger Vital Sign    Worried About Running Out of Food in the Last Year: Never true    Ran Out of Food in the Last Year: Never true  Transportation Needs: No Transportation Needs (04/13/2024)   PRAPARE - Administrator, Civil Service (Medical): No    Lack of Transportation (Non-Medical): No  Physical Activity: Unknown (06/09/2024)   Exercise Vital Sign    Days of Exercise per Week: 4 days    Minutes of Exercise per Session: Not on file  Stress: Stress Concern Present (04/13/2024)   [employer] of Occupational Health - Occupational Stress Questionnaire    Feeling of Stress : To some extent  Social Connections: Unknown (04/13/2024)   Social Connection and Isolation Panel    Frequency of Communication with Friends and Family: Three times a week    Frequency of Social Gatherings with  Friends and Family: Three times a week    Attends Religious Services: 1 to 4 times per year    Active Member of Clubs or Organizations: No    Attends Engineer, structural: Not on file    Marital Status: Patient declined    Family History  Problem Relation Age of Onset   Diabetes Mother    Hypertension Mother    Throat cancer Maternal Aunt    Cirrhosis Maternal Grandfather    Colon cancer Neg Hx    Colon polyps Neg Hx    Kidney disease Neg Hx     Review of Systems:  Review of Systems  Constitutional:  Negative for chills, fever and weight loss.  HENT:  Negative for tinnitus.   Respiratory:  Negative for cough, sputum production and shortness of breath.   Cardiovascular:  Negative for chest pain, palpitations and leg swelling.  Gastrointestinal:  Negative for abdominal pain, constipation, diarrhea and heartburn.  Genitourinary:  Negative for dysuria, frequency and urgency.  Musculoskeletal:  Negative for back pain, falls, joint pain and myalgias.  Skin: Negative.   Neurological:  Negative for dizziness and  headaches.  Psychiatric/Behavioral:  Negative for depression and memory loss. The patient does not have insomnia.      Allergies as of 06/09/2024       Reactions   Mushroom Extract Complex (obsolete) Anaphylaxis, Swelling   Lips   Shrimp [shellfish Allergy] Anaphylaxis   Tomato Anaphylaxis   Tylenol  [acetaminophen ] Swelling        Medication List        Accurate as of June 09, 2024  8:48 AM. If you have any questions, ask your nurse or doctor.          ALPRAZolam  1 MG tablet Commonly known as: XANAX  Take 0.5-1 tablets (0.5-1 mg total) by mouth daily as needed for anxiety.   atorvastatin  20 MG tablet Commonly known as: LIPITOR TAKE 1 TABLET BY MOUTH THREE  TIMES WEEKLY IN THE EVENING.   losartan  100 MG tablet Commonly known as: COZAAR  Take 1 tablet (100 mg total) by mouth daily.   Semaglutide  (1 MG/DOSE) 4 MG/3ML Sopn Inject 1 mg as directed  once a week.   sildenafil  100 MG tablet Commonly known as: VIAGRA  TAKE 1 TABLET BY MOUTH DAILY AS  NEEDED FOR ERECTILE DYSFUNCTION   UNABLE TO FIND Med Name: Shilajit resin 200 mg resin twice daily by mouth for cholesterol          Physical Exam: Vitals:   06/09/24 0830 06/09/24 0835  BP: (!) 144/112 (!) 140/100  Pulse: 81   Temp: 98 F (36.7 C)   SpO2: 97%   Weight: (!) 310 lb 3.2 oz (140.7 kg)   Height: 6' 6 (1.981 m)    Body mass index is 35.85 kg/m. Wt Readings from Last 3 Encounters:  06/09/24 (!) 310 lb 3.2 oz (140.7 kg)  04/14/24 (!) 311 lb 6.4 oz (141.3 kg)  01/11/24 (!) 312 lb 9.6 oz (141.8 kg)    Physical Exam Constitutional:      General: He is not in acute distress.    Appearance: He is well-developed. He is not diaphoretic.  HENT:     Head: Normocephalic and atraumatic.     Right Ear: External ear normal.     Left Ear: External ear normal.     Mouth/Throat:     Pharynx: No oropharyngeal exudate.  Eyes:     Conjunctiva/sclera: Conjunctivae normal.     Pupils: Pupils are equal, round, and reactive to light.  Cardiovascular:     Rate and Rhythm: Normal rate and regular rhythm.     Heart sounds: Normal heart sounds.  Pulmonary:     Effort: Pulmonary effort is normal.     Breath sounds: Normal breath sounds.  Abdominal:     General: Bowel sounds are normal.     Palpations: Abdomen is soft.  Musculoskeletal:        General: No tenderness.     Cervical back: Normal range of motion and neck supple.     Right lower leg: No edema.     Left lower leg: No edema.  Skin:    General: Skin is warm and dry.  Neurological:     Mental Status: He is alert and oriented to person, place, and time.     Labs reviewed: Basic Metabolic Panel: Recent Labs    07/11/23 1618 01/14/24 1023  NA 139 139  K 4.2 4.0  CL 104 104  CO2 26 29  GLUCOSE 95 99  BUN 18 11  CREATININE 1.06 1.13  CALCIUM  9.6 9.6  TSH 1.40  --  Liver Function Tests: Recent Labs     07/11/23 1618 01/14/24 1023  AST 31 43*  ALT 34 44  BILITOT 0.4 0.6  PROT 6.8 6.9   No results for input(s): LIPASE, AMYLASE in the last 8760 hours. No results for input(s): AMMONIA in the last 8760 hours. CBC: Recent Labs    07/11/23 1618 01/14/24 1023  WBC 6.5 5.6  NEUTROABS 3,094 2,817  HGB 14.5 15.1  HCT 44.2 47.3  MCV 80.5 82.0  PLT 224 261   Lipid Panel: Recent Labs    07/11/23 1618 01/14/24 1023  CHOL 153 148  HDL 62 63  LDLCALC 80 74  TRIG 39 40  CHOLHDL 2.5 2.3   Lab Results  Component Value Date   HGBA1C 5.9 (H) 01/14/2024    Procedures: No results found.  Assessment/Plan 1. Type 2 diabetes mellitus with obesity (HCC) -Encouraged dietary compliance, routine foot care/monitoring and to keep up with diabetic eye exams through ophthalmology  -continues on ozempic  1 mg weekly  - Urine Albumin/Creatinine with ratio (send out) [LAB689] - Hemoglobin A1c - Lipid panel - CBC with Differential/Platelet - Comprehensive metabolic panel with GFR  2. Essential hypertension (Primary) -Blood pressure elevated today but typically well controlled -home blood pressures are well controlled -No changes to medications today  -will have pt continue to monitor home bp goal <140/90, to notify if readings remain high on 3 different days  -follow metabolic panel -cardiology referral due multiple comorbidies with chest pain -EKG stable from previous- SR  3. Anxiety -continues on xanax , takes as prescribed - DRUG MONITORING, PANEL 6 WITH CONFIRMATION, URINE - ALPRAZolam  (XANAX ) 1 MG tablet; Take 0.5-1 tablets (0.5-1 mg total) by mouth daily as needed for anxiety.  Dispense: 30 tablet; Refill: 2  4. Hyperlipidemia, unspecified hyperlipidemia type Continues on lipitor 20 mg daily with dietary modifications  - Lipid panel - CBC with Differential/Platelet - Comprehensive metabolic panel with GFR - Ambulatory referral to Cardiology  5. Therapeutic drug  monitoring - DRUG MONITORING, PANEL 6 WITH CONFIRMATION, URINE  6. Callus of foot - Ambulatory referral to Podiatry  7. Morbid obesity (HCC) -BMI of 35 with DM, htn, hyperlipidemia education provided on healthy weight loss through increase in physical activity and proper nutrition  - Ambulatory referral to Cardiology   Next appt:: 6 month  Mykaylah Ballman K. Churchill, [place]  [facility] [phone]

## 2024-06-10 ENCOUNTER — Ambulatory Visit: Payer: Self-pay | Admitting: Nurse Practitioner

## 2024-06-10 LAB — CBC WITH DIFFERENTIAL/PLATELET
Absolute Lymphocytes: 2137 {cells}/uL (ref 850–3900)
Absolute Monocytes: 563 {cells}/uL (ref 200–950)
Basophils Absolute: 27 {cells}/uL (ref 0–200)
Basophils Relative: 0.4 %
Eosinophils Absolute: 80 {cells}/uL (ref 15–500)
Eosinophils Relative: 1.2 %
HCT: 43 % (ref 38.5–50.0)
Hemoglobin: 13.8 g/dL (ref 13.2–17.1)
MCH: 26.7 pg — ABNORMAL LOW (ref 27.0–33.0)
MCHC: 32.1 g/dL (ref 32.0–36.0)
MCV: 83.2 fL (ref 80.0–100.0)
MPV: 11.2 fL (ref 7.5–12.5)
Monocytes Relative: 8.4 %
Neutro Abs: 3893 {cells}/uL (ref 1500–7800)
Neutrophils Relative %: 58.1 %
Platelets: 220 Thousand/uL (ref 140–400)
RBC: 5.17 Million/uL (ref 4.20–5.80)
RDW: 13.1 % (ref 11.0–15.0)
Total Lymphocyte: 31.9 %
WBC: 6.7 Thousand/uL (ref 3.8–10.8)

## 2024-06-10 LAB — DRUG MONITORING, PANEL 6 WITH CONFIRMATION, URINE
6 Acetylmorphine: NEGATIVE ng/mL (ref <10)
Alcohol Metabolites: NEGATIVE ng/mL (ref <500)
Amphetamines: NEGATIVE ng/mL (ref <500)
Barbiturates: NEGATIVE ng/mL (ref <300)
Benzodiazepines: NEGATIVE ng/mL (ref <100)
Cocaine Metabolite: NEGATIVE ng/mL (ref <150)
Creatinine: >300 mg/dL (ref >=20.0)
Marijuana Metabolite: NEGATIVE ng/mL (ref <20)
Methadone Metabolite: NEGATIVE ng/mL (ref <100)
Opiates: NEGATIVE ng/mL (ref <100)
Oxidant: NEGATIVE ug/mL (ref <200)
Oxycodone: NEGATIVE ng/mL (ref <100)
Phencyclidine: NEGATIVE ng/mL (ref <25)
pH: 5.5 (ref 4.5–9.0)

## 2024-06-10 LAB — MICROALBUMIN / CREATININE URINE RATIO
Creatinine, Urine: 279 mg/dL (ref 20–320)
Microalb Creat Ratio: 4 mg/g{creat} (ref <30)
Microalb, Ur: 1.2 mg/dL

## 2024-06-10 LAB — COMPREHENSIVE METABOLIC PANEL WITH GFR
AG Ratio: 1.8 (calc) (ref 1.0–2.5)
ALT: 29 U/L (ref 9–46)
AST: 37 U/L (ref 10–40)
Albumin: 4.1 g/dL (ref 3.6–5.1)
Alkaline phosphatase (APISO): 70 U/L (ref 36–130)
BUN: 14 mg/dL (ref 7–25)
CO2: 28 mmol/L (ref 20–32)
Calcium: 9.4 mg/dL (ref 8.6–10.3)
Chloride: 108 mmol/L (ref 98–110)
Creat: 1.04 mg/dL (ref 0.60–1.26)
Globulin: 2.3 g/dL (ref 1.9–3.7)
Glucose, Bld: 97 mg/dL (ref 65–99)
Potassium: 4.5 mmol/L (ref 3.5–5.3)
Sodium: 141 mmol/L (ref 135–146)
Total Bilirubin: 0.5 mg/dL (ref 0.2–1.2)
Total Protein: 6.4 g/dL (ref 6.1–8.1)
eGFR: 95 mL/min/1.73m2 (ref >=60)

## 2024-06-10 LAB — LIPID PANEL
Cholesterol: 117 mg/dL (ref <200)
HDL: 60 mg/dL (ref >=40)
LDL Cholesterol (Calc): 47 mg/dL
Non-HDL Cholesterol (Calc): 57 mg/dL (ref <130)
Total CHOL/HDL Ratio: 2 (calc) (ref <5.0)
Triglycerides: 36 mg/dL (ref <150)

## 2024-06-10 LAB — DM TEMPLATE

## 2024-06-10 LAB — HEMOGLOBIN A1C
Hgb A1c MFr Bld: 6 % — ABNORMAL HIGH (ref <5.7)
Mean Plasma Glucose: 126 mg/dL
eAG (mmol/L): 7 mmol/L

## 2024-06-16 ENCOUNTER — Ambulatory Visit (INDEPENDENT_AMBULATORY_CARE_PROVIDER_SITE_OTHER)

## 2024-06-16 ENCOUNTER — Encounter: Payer: Self-pay | Admitting: Podiatry

## 2024-06-16 ENCOUNTER — Ambulatory Visit (INDEPENDENT_AMBULATORY_CARE_PROVIDER_SITE_OTHER): Admitting: Podiatry

## 2024-06-16 VITALS — Ht 78.0 in | Wt 310.0 lb

## 2024-06-16 DIAGNOSIS — M7752 Other enthesopathy of left foot: Secondary | ICD-10-CM

## 2024-06-16 DIAGNOSIS — M2042 Other hammer toe(s) (acquired), left foot: Secondary | ICD-10-CM

## 2024-06-16 MED ORDER — TRIAMCINOLONE ACETONIDE 10 MG/ML IJ SUSP
10.0000 mg | Freq: Once | INTRAMUSCULAR | Status: AC
  Administered 2024-06-16 (×2): 10 mg via INTRA_ARTICULAR

## 2024-06-18 NOTE — Progress Notes (Signed)
 Subjective:   Patient ID: Jonathan Taylor, male   DOB: 38 y.o.   MRN: [id number]   HPI Patient presents with a painful lesion of the fifth digit left foot that is been present for around 4 to 6 months has gotten worse recently and he has tried to trim it soak it and cushion it without relief of symptoms.  Patient does not smoke likes to be active   Review of Systems  All other systems reviewed and are negative.       Objective:  Physical Exam Vitals and nursing note reviewed.  Constitutional:      Appearance: He is well-developed.  Pulmonary:     Effort: Pulmonary effort is normal.  Musculoskeletal:        General: Normal range of motion.  Skin:    General: Skin is warm.  Neurological:     Mental Status: He is alert.     Neurovascular status intact muscle strength is found to be adequate range of motion adequate with inflammation fluid around the fifth digit left painful when pressed rotation of the toe and keratotic tissue noted upon deep pressure.  Patient has good digital perfusion well-oriented x 3     Assessment:  Hammertoe deformity digit 5 left with fluid buildup and pain     Plan:  H&P reviewed condition and discussed treatment.  At this point I did sterile prep and I carefully injected the inner phalangeal joint left fifth digit 3 mg dexamethasone Kenalog  5 mg Xylocaine  that did courtesy debridement of lesion applied cushioning and discussed arthroplasty.  I do think ultimately arthroplasty will be necessary for this and I explained the procedure to patient  X-rays indicate that there is rotation of the fifth digit left foot with pressure occurring against the digit itself

## 2024-07-07 ENCOUNTER — Other Ambulatory Visit: Payer: Self-pay | Admitting: Nurse Practitioner

## 2024-07-07 DIAGNOSIS — E669 Obesity, unspecified: Secondary | ICD-10-CM

## 2024-07-23 ENCOUNTER — Other Ambulatory Visit: Payer: Self-pay | Admitting: Nurse Practitioner

## 2024-07-23 DIAGNOSIS — E785 Hyperlipidemia, unspecified: Secondary | ICD-10-CM

## 2024-07-23 DIAGNOSIS — I10 Essential (primary) hypertension: Secondary | ICD-10-CM

## 2024-08-11 ENCOUNTER — Ambulatory Visit: Admitting: Internal Medicine

## 2024-08-14 ENCOUNTER — Ambulatory Visit

## 2024-08-14 ENCOUNTER — Ambulatory Visit: Attending: Cardiology | Admitting: Cardiology

## 2024-08-14 ENCOUNTER — Encounter: Payer: Self-pay | Admitting: Cardiology

## 2024-08-14 VITALS — BP 136/80 | HR 80 | Ht 75.0 in | Wt 314.0 lb

## 2024-08-14 DIAGNOSIS — R079 Chest pain, unspecified: Secondary | ICD-10-CM

## 2024-08-14 DIAGNOSIS — I10 Essential (primary) hypertension: Secondary | ICD-10-CM | POA: Diagnosis not present

## 2024-08-14 DIAGNOSIS — R002 Palpitations: Secondary | ICD-10-CM | POA: Diagnosis not present

## 2024-08-14 DIAGNOSIS — E1169 Type 2 diabetes mellitus with other specified complication: Secondary | ICD-10-CM | POA: Insufficient documentation

## 2024-08-14 DIAGNOSIS — E785 Hyperlipidemia, unspecified: Secondary | ICD-10-CM

## 2024-08-14 DIAGNOSIS — R2242 Localized swelling, mass and lump, left lower limb: Secondary | ICD-10-CM | POA: Insufficient documentation

## 2024-08-14 DIAGNOSIS — G4733 Obstructive sleep apnea (adult) (pediatric): Secondary | ICD-10-CM | POA: Diagnosis not present

## 2024-08-14 NOTE — Assessment & Plan Note (Signed)
 Blood pressure slightly elevated at 140/100 mmHg. Control important for cardiovascular health, especially for long-haul truck driver. - Continue losartan  100 mg daily. - Monitor blood pressure regularly, especially before DOT physicals. - Advise on lifestyle modifications, including weight loss and dietary changes. - If additional blood pressure medication would be needed, the first choice would be a diuretic, but with his palpitations could also consider beta-blocker.

## 2024-08-14 NOTE — Assessment & Plan Note (Signed)
 Chronic edema likely due to immobility and venous stasis, exacerbated by prolonged driving. - Recommend wearing compression stockings, such as Tommy Copper support socks.

## 2024-08-14 NOTE — Assessment & Plan Note (Signed)
 Intermittent palpitations and chest discomfort likely due to premature beats and fatigue. Differential includes atrial fibrillation, SVT and bigeminy but symptoms duration not suggestive of AFib. No evidence of dangerous arrhythmias. - Order two-week Zio patch cardiac event monitor to assess for arrhythmias. - Instruct to record symptoms and press monitor button during episodes. - Advise to avoid dehydration and maintain electrolyte balance. - Educated on maneuvers to break palpitations, such as coughing or Valsalva maneuver.

## 2024-08-14 NOTE — Progress Notes (Signed)
 Cardiology Office Note:  .   Date:  08/14/2024  ID:  Jonathan Jonathan, DOB 02-Jul-1986, MRN [id number] PCP: Jonathan Jonathan POUR, NP  [facility] Providers Cardiologist:  None     Chief Complaint  Patient presents with   New Patient (Initial Visit)    Hx of discomfort with palpitations Hypertension    Palpitations    Patient Profile: .     Jonathan Jonathan is a moderately obese 38 y.o. male  with a PMH notable for HTN, HLD and DM-2 (metabolic syndrome) who presents here for ?  Management of Morbid Obesity and Hyperlipidemia at the request of Jonathan Jonathan POUR, NP.  I saw Jonathan Jonathan for 1 visit on April 27, 2017 for evaluation of intermittent episodes of sharp left-sided chest pain that takes his breath away .  At that time he had significant cut down from 1-1.5 PPD smoking to maybe 1 or 2 cigarettes a day.  He had just been diagnosed with diabetes but was not on medications.  We evaluated his chest pain with a GXT reviewed below.  He did not return after the negative results were called.     Jonathan Jonathan was seen by Jonathan Caro, NP on 06/09/2024 and he noted occasional slight chest pain with overexertion or smoking.  Also some left leg swelling after long period of sitting.  Subjective  Discussed the use of AI scribe software for clinical note transcription with the patient, who gave verbal consent to proceed.  History of Present Illness Jonathan Jonathan is a 38 year old male with hypertension and diabetes who presents with chest pain and palpitations.  He experiences sharp chest pain around the heart area, sometimes radiating downwards. 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 The symptoms are more frequent when he is overexerted or tired.  He has palpitations described as a flutter lasting 30 to 45 seconds, followed by a racing sensation lasting 7 to 10 seconds. These episodes occur about twice a week, typically towards the end of the week  after long periods of driving. He denies any caffeine intake, consuming only water and regular Ginger Ale.  He has a history of hypertension, currently managed with losartan  100 mg. His blood pressure readings have been around 136/80, but he notes a recent reading of 140/100. He also takes Lipitor 20 mg for cholesterol management, with recent labs showing an LDL of 47.  He is on Ozempic  for weight management and has lost weight from 450 pounds.  He reports swelling in his left foot, which occurs after long periods of driving, attributed to limited movement of the leg. He does not use support stockings but wants to obtain them.  No shortness of breath, orthopnea, or waking up short of breath.   Cardiovascular ROS: positive for - chest pain, irregular heartbeat, palpitations, rapid heart rate, and seems like it is getting better -- worse @ end of a long week of driving; chronic L LE swelling -- does not use Left leg when driving. negative for - orthopnea, paroxysmal nocturnal dyspnea, shortness of breath, or lightheadedness or dizziness wooziness, syncope or near syncope or TIA or emesis VS month medication  ROS:  Review of Systems - negative as noted above    Objective   Medications: Atorvastatin  20 mg daily; losartan  100 mg daily; Ozempic  4 mg weekly; albuterol  inhaler 1 to 2 puffs every 6 hours as needed; as needed Xanax  1 mg (1/2-1 tab) as needed anxiety  Studies  Reviewed: SABRA       Lab Results  Component Value Date   CHOL 117 06/09/2024   HDL 60 06/09/2024   LDLCALC 47 06/09/2024   TRIG 36 06/09/2024   CHOLHDL 2.0 06/09/2024   Lab Results  Component Value Date   NA 141 06/09/2024   K 4.5 06/09/2024   CREATININE 1.04 06/09/2024   EGFR 95 06/09/2024   GLUCOSE 97 06/09/2024   Lab Results  Component Value Date   HGBA1C 6.0 (H) 06/09/2024    Results LABS LDL: 47 (06/2024) A1c: 6 (91/7974) ETT (05/17/2017): He exercised 5: 34 minutes.  7.2 METS.  Achieved 85% MPHR.  No  ischemic changes.  [facility] TM score 5  Risk Assessment/Calculations:             Physical Exam:   VS:  BP 136/80 (BP Location: Left Arm, Patient Position: Sitting, Cuff Size: Normal)   Pulse 80   Ht 6' 3 (1.905 m)   Wt (!) 314 lb (142.4 kg)   SpO2 98%   BMI 39.25 kg/m    Wt Readings from Last 3 Encounters:  08/14/24 (!) 314 lb (142.4 kg)  06/16/24 (!) 310 lb (140.6 kg)  06/09/24 (!) 310 lb 3.2 oz (140.7 kg)      GEN: Healthy appearing.  Well nourished, well groomed in no acute distress; borderline morbidly obese NECK: No JVD; No carotid bruits CARDIAC: RRR, Distant heart sounds but normal S1, S2;  no murmurs, rubs, gallops RESPIRATORY:  Clear to auscultation without rales, wheezing or rhonchi ; nonlabored, good air movement. ABDOMEN: Soft, non-tender, non-distended EXTREMITIES:  No edema; No deformity      ASSESSMENT AND PLAN: .    Problem List Items Addressed This Visit       Cardiology Problems   Essential hypertension (Chronic)   Blood pressure slightly elevated at 140/100 mmHg. Control important for cardiovascular health, especially for long-haul truck driver. - Continue losartan  100 mg daily. - Monitor blood pressure regularly, especially before DOT physicals. - Advise on lifestyle modifications, including weight loss and dietary changes. - If additional blood pressure medication would be needed, the first choice would be a diuretic, but with his palpitations could also consider beta-blocker.      Relevant Orders   EKG 12-Lead   Hyperlipidemia associated with type 2 diabetes mellitus (HCC)   Lipids are pretty well-controlled with LDL 47 and total cholesterol of 117 and triglycerides 36 on his Ozempic  and 20 mg atorvastatin .  -Continue current regimen including 20 mg of Lipitor and Ozempic  per PCP.        Other   Chest pain with low risk for cardiac etiology (Chronic)   He is not really describing chest pain what he is describing is more the flutters they can  be uncomfortable but short-lived and not exertional. Will evaluate with monitor.      Relevant Orders   EKG 12-Lead   Localized swelling of left lower leg (Chronic)   Chronic edema likely due to immobility and venous stasis, exacerbated by prolonged driving. - Recommend wearing compression stockings, such as Tommy Copper support socks.      Obesity, morbid, BMI 40.0-49.9 (HCC) (Chronic)    Significant weight loss from 450 pounds due to Ozempic  and lifestyle changes. Continued weight loss beneficial for health and comorbid conditions. - Encourage continued weight loss through diet and exercise. - Monitor for potential side effects of Ozempic , such as reduced appetite and dehydration.      Relevant Orders  EKG 12-Lead   OSA (obstructive sleep apnea) (Chronic)   Continue to stay adherent      Relevant Orders   EKG 12-Lead   Palpitations - Primary   Intermittent palpitations and chest discomfort likely due to premature beats and fatigue. Differential includes atrial fibrillation, SVT and bigeminy but symptoms duration not suggestive of AFib. No evidence of dangerous arrhythmias. - Order two-week Zio patch cardiac event monitor to assess for arrhythmias. - Instruct to record symptoms and press monitor button during episodes. - Advise to avoid dehydration and maintain electrolyte balance. - Educated on maneuvers to break palpitations, such as coughing or Valsalva maneuver.      Relevant Orders   LONG TERM MONITOR (3-14 DAYS)           Follow-Up: Return in about 4 weeks (around 09/11/2024) for Followup with Telemedicine.      Signed, Alm MICAEL Clay, MD, MS Alm Clay, M.D., M.S. Interventional Cardiologist  [facility] Pager # [phone]

## 2024-08-14 NOTE — Patient Instructions (Signed)
 Medication Instructions:  Your physician recommends that you continue on your current medications as directed. Please refer to the Current Medication list given to you today.   *If you need a refill on your cardiac medications before your next appointment, please call your pharmacy*  Lab Work: No labs ordered today  If you have labs (blood work) drawn today and your tests are completely normal, you will receive your results only by: MyChart Message (if you have MyChart) OR A paper copy in the mail If you have any lab test that is abnormal or we need to change your treatment, we will call you to review the results.  Testing/Procedures: GEOFFRY HEWS- Long Term Monitor Instructions  Your physician has requested you wear a ZIO patch monitor for 14 days.  This is a single patch monitor. [employer] supplies one patch monitor per enrollment. Additional stickers are not available. Please do not apply patch if you will be having a Nuclear Stress Test, Echocardiogram, Cardiac CT, MRI, or Chest Xray during the period you would be wearing the monitor. The patch cannot be worn during these tests. You cannot remove and re-apply the ZIO XT patch monitor.  Your ZIO patch monitor will be mailed 3 day USPS to your address on file. It may take 3-5 days to receive your monitor after you have been enrolled. Once you have received your monitor, please review the enclosed instructions. Your monitor has already been registered assigning a specific monitor serial number to you.  Billing and Patient Assistance Program Information  We have supplied [employer] with any of your insurance information on file for billing purposes.  [employer] offers a sliding scale Patient Assistance Program for patients that do not have insurance, or whose insurance does not completely cover the cost of the ZIO monitor.  You must apply for the Patient Assistance Program to qualify for this discounted rate.  To apply, please call [employer] at [phone],  select option 4, select option 2, ask to apply for Patient Assistance Program. Meredeth will ask your household income, and how many people are in your household. They will quote your out-of-pocket cost based on that information. [employer] will also be able to set up a 22-month, interest-free payment plan if needed.  Applying the monitor   Shave hair from upper left chest.  Hold abrader disc by orange tab. Rub abrader in 40 strokes over the upper left chest as indicated in your monitor instructions.  Clean area with 4 enclosed alcohol pads. Let dry.  Apply patch as indicated in monitor instructions. Patch will be placed under collarbone on left side of chest with arrow pointing upward.  Rub patch adhesive wings for 2 minutes. Remove white label marked 1. Remove the white label marked 2. Rub patch adhesive wings for 2 additional minutes.  While looking in a mirror, press and release button in center of patch. A small green light will flash 3-4 times. This will be your only indicator that the monitor has been turned on.  Do not shower for the first 24 hours. You may shower after the first 24 hours.  Press the button if you feel a symptom. You will hear a small click. Record Date, Time and Symptom in the Patient Logbook.  When you are ready to remove the patch, follow instructions on the last 2 pages of Patient Logbook.  Stick patch monitor into the tabs at the bottom of the return box.  Place Patient Logbook in the blue and white box. Use locking tab  on box and tape box closed securely. The blue and white box has prepaid postage on it. Please place it in the mailbox as soon as possible. Your physician should have your test results approximately 7-14 days after the monitor has been mailed back to [facility].  Call [facility] Customer Care at [phone] if you have questions regarding your ZIO XT patch monitor.  Call them immediately if you see an orange light blinking on your monitor.  If  your monitor falls off in less than 4 days, contact our Monitor department at [phone].  If your monitor becomes loose or falls off after 4 days call [employer] at [phone] for suggestions on securing your monitor.   Follow-Up: At [facility], you and your health needs are our priority.  As part of our continuing mission to provide you with exceptional heart care, our providers are all part of one team.  This team includes your primary Cardiologist (physician) and Advanced Practice Providers or APPs (Physician Assistants and Nurse Practitioners) who all work together to provide you with the care you need, when you need it.  Your next appointment:   1 month(s)  Provider:   1 month virtually Lonni Meager, NP Lesley Maffucci, PA-C Bernardino Bring, PA-C Cadence Cheswick, PA-C Tylene Lunch, NP Barnie Hila, NP    We recommend signing up for the patient portal called MyChart.  Sign up information is provided on this After Visit Summary.  MyChart is used to connect with patients for Virtual Visits (Telemedicine).  Patients are able to view lab/test results, encounter notes, upcoming appointments, etc.  Non-urgent messages can be sent to your provider as well.   To learn more about what you can do with MyChart, go to [email or web address].

## 2024-08-14 NOTE — Assessment & Plan Note (Signed)
 Lipids are pretty well-controlled with LDL 47 and total cholesterol of 117 and triglycerides 36 on his Ozempic  and 20 mg atorvastatin .  -Continue current regimen including 20 mg of Lipitor and Ozempic  per PCP.

## 2024-08-14 NOTE — Assessment & Plan Note (Addendum)
  Significant weight loss from 450 pounds due to Ozempic  and lifestyle changes. Continued weight loss beneficial for health and comorbid conditions. - Encourage continued weight loss through diet and exercise. - Monitor for potential side effects of Ozempic , such as reduced appetite and dehydration.

## 2024-08-14 NOTE — Assessment & Plan Note (Signed)
 Continue to stay adherent

## 2024-08-14 NOTE — Assessment & Plan Note (Signed)
 He is not really describing chest pain what he is describing is more the flutters they can be uncomfortable but short-lived and not exertional. Will evaluate with monitor.

## 2024-09-10 ENCOUNTER — Other Ambulatory Visit: Payer: Self-pay

## 2024-09-10 DIAGNOSIS — E785 Hyperlipidemia, unspecified: Secondary | ICD-10-CM

## 2024-09-10 DIAGNOSIS — I10 Essential (primary) hypertension: Secondary | ICD-10-CM

## 2024-09-10 MED ORDER — ATORVASTATIN CALCIUM 20 MG PO TABS: ORAL_TABLET | ORAL | 1 refills | Status: AC

## 2024-09-10 MED ORDER — LOSARTAN POTASSIUM 100 MG PO TABS: 100.0000 mg | ORAL_TABLET | Freq: Every day | ORAL | 0 refills | Status: AC

## 2024-09-15 ENCOUNTER — Telehealth: Payer: Self-pay | Admitting: Cardiology

## 2024-09-15 NOTE — Telephone Encounter (Signed)
 Pt was delayed in wearing monitor due to his mother having lung cancer. Pt was not able to do log ins, but he did wear the monitor for 14 days and mailed it back. Pt is requesting callback for further instructions for returning monitor. Please advise.

## 2024-09-15 NOTE — Telephone Encounter (Signed)
 Gave patient instructions on how to mail back his ZIO patch monitor.  His provider should have the results typically within 7 days of mailing it back for processing.

## 2024-09-26 ENCOUNTER — Encounter: Payer: Self-pay | Admitting: Cardiology

## 2024-10-19 DIAGNOSIS — R002 Palpitations: Secondary | ICD-10-CM

## 2024-10-20 ENCOUNTER — Ambulatory Visit: Payer: Self-pay | Admitting: Cardiology

## 2024-10-21 NOTE — Progress Notes (Signed)
 Last read by Guillermina Single at 8:31PM on 10/20/2024.

## 2024-12-10 ENCOUNTER — Encounter: Payer: Self-pay | Admitting: *Deleted

## 2024-12-10 ENCOUNTER — Ambulatory Visit
Admission: EM | Admit: 2024-12-10 | Discharge: 2024-12-10 | Disposition: A | Discharge status: Home / Self Care | Source: Home / Self Care

## 2024-12-10 DIAGNOSIS — R221 Localized swelling, mass and lump, neck: Secondary | ICD-10-CM

## 2024-12-10 MED ORDER — CLINDAMYCIN HCL 150 MG PO CAPS: 450.0000 mg | ORAL_CAPSULE | Freq: Three times a day (TID) | ORAL | 0 refills | Status: AC

## 2024-12-10 NOTE — Discharge Instructions (Signed)
" °  1. Localized swelling, mass and lump, neck (Primary) - Take previously prescribed diclofenac (VOLTAREN) 75 MG EC tablet; Take 75 mg by mouth 2 (two) times daily for neck swelling and pain secondary to soft tissue infection/mass.  - CBC with Differential/Platelet, Basic metabolic panel and TSH serum tests collected in UC and sent to lab for further testing results should be available in 1 to 2 days. - clindamycin  (CLEOCIN ) 150 MG capsule; Take 3 capsules (450 mg total) by mouth 3 (three) times daily for 7 days.  Dispense: 63 capsule; Refill: 0  -Continue to monitor symptoms for any change in severity if there is any escalation of current symptoms or development of new symptoms follow-up in ER for further evaluation and management. "
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
## 2024-12-11 ENCOUNTER — Ambulatory Visit (HOSPITAL_COMMUNITY): Payer: Self-pay

## 2024-12-11 LAB — CBC WITH DIFFERENTIAL/PLATELET
Basophils Absolute: 0 10*3/uL (ref 0.0–0.2)
Basos: 0 %
EOS (ABSOLUTE): 0.1 10*3/uL (ref 0.0–0.4)
Eos: 1 %
Hematocrit: 46 % (ref 37.5–51.0)
Hemoglobin: 15 g/dL (ref 13.0–17.7)
Immature Grans (Abs): 0 10*3/uL (ref 0.0–0.1)
Immature Granulocytes: 0 %
Lymphocytes Absolute: 2.6 10*3/uL (ref 0.7–3.1)
Lymphs: 30 %
MCH: 26.5 pg — ABNORMAL LOW (ref 26.6–33.0)
MCHC: 32.6 g/dL (ref 31.5–35.7)
MCV: 81 fL (ref 79–97)
Monocytes Absolute: 0.7 10*3/uL (ref 0.1–0.9)
Monocytes: 8 %
Neutrophils Absolute: 5.4 10*3/uL (ref 1.4–7.0)
Neutrophils: 61 %
Platelets: 241 10*3/uL (ref 150–450)
RBC: 5.65 x10E6/uL (ref 4.14–5.80)
RDW: 13.5 % (ref 11.6–15.4)
WBC: 8.9 10*3/uL (ref 3.4–10.8)

## 2024-12-11 LAB — BASIC METABOLIC PANEL WITH GFR
BUN/Creatinine Ratio: 20 (ref 9–20)
BUN: 20 mg/dL (ref 6–20)
CO2: 20 mmol/L (ref 20–29)
Calcium: 9.3 mg/dL (ref 8.7–10.2)
Chloride: 107 mmol/L — ABNORMAL HIGH (ref 96–106)
Creatinine, Ser: 1.02 mg/dL (ref 0.76–1.27)
Glucose: 142 mg/dL — ABNORMAL HIGH (ref 70–99)
Potassium: 4.2 mmol/L (ref 3.5–5.2)
Sodium: 142 mmol/L (ref 134–144)
eGFR: 96 mL/min/{1.73_m2}

## 2024-12-11 LAB — TSH: TSH: 1.03 u[IU]/mL (ref 0.450–4.500)
